# Patient Record
Sex: Female | Born: 1935 | Race: White | Hispanic: No | Smoking: Never smoker
Health system: Southern US, Community
[De-identification: ages and names within clinical notes are randomized; demographics above are authoritative.]

## PROBLEM LIST (undated history)

## (undated) DIAGNOSIS — I10 Essential (primary) hypertension: Secondary | ICD-10-CM

## (undated) DIAGNOSIS — M8580 Other specified disorders of bone density and structure, unspecified site: Secondary | ICD-10-CM

## (undated) DIAGNOSIS — K5909 Other constipation: Secondary | ICD-10-CM

## (undated) DIAGNOSIS — I6529 Occlusion and stenosis of unspecified carotid artery: Secondary | ICD-10-CM

## (undated) DIAGNOSIS — M1990 Unspecified osteoarthritis, unspecified site: Secondary | ICD-10-CM

## (undated) DIAGNOSIS — E785 Hyperlipidemia, unspecified: Secondary | ICD-10-CM

## (undated) HISTORY — DX: Hyperlipidemia, unspecified: E78.5

## (undated) HISTORY — DX: Other specified disorders of bone density and structure, unspecified site: M85.80

## (undated) HISTORY — DX: Unspecified osteoarthritis, unspecified site: M19.90

## (undated) HISTORY — PX: OTHER SURGICAL HISTORY: SHX169

## (undated) HISTORY — DX: Occlusion and stenosis of unspecified carotid artery: I65.29

## (undated) HISTORY — DX: Essential (primary) hypertension: I10

## (undated) HISTORY — DX: Other constipation: K59.09

---

## 1999-03-12 ENCOUNTER — Encounter: Payer: Self-pay | Admitting: Internal Medicine

## 1999-03-12 ENCOUNTER — Encounter: Admission: RE | Admit: 1999-03-12 | Discharge: 1999-03-12 | Payer: Self-pay | Admitting: Internal Medicine

## 1999-04-30 ENCOUNTER — Encounter: Payer: Self-pay | Admitting: Family Medicine

## 1999-04-30 LAB — CONVERTED CEMR LAB: Pap Smear: NORMAL

## 1999-05-04 ENCOUNTER — Encounter: Payer: Self-pay | Admitting: Urology

## 1999-05-04 ENCOUNTER — Encounter: Admission: RE | Admit: 1999-05-04 | Discharge: 1999-05-04 | Payer: Self-pay | Admitting: Urology

## 1999-05-15 ENCOUNTER — Other Ambulatory Visit: Admission: RE | Admit: 1999-05-15 | Discharge: 1999-05-15 | Payer: Self-pay | Admitting: Obstetrics & Gynecology

## 1999-05-19 ENCOUNTER — Encounter: Payer: Self-pay | Admitting: Internal Medicine

## 1999-05-19 ENCOUNTER — Encounter: Admission: RE | Admit: 1999-05-19 | Discharge: 1999-05-19 | Payer: Self-pay | Admitting: Internal Medicine

## 1999-07-28 ENCOUNTER — Encounter: Admission: RE | Admit: 1999-07-28 | Discharge: 1999-08-13 | Payer: Self-pay | Admitting: Family Medicine

## 2000-03-07 ENCOUNTER — Encounter: Admission: RE | Admit: 2000-03-07 | Discharge: 2000-03-07 | Payer: Self-pay | Admitting: Family Medicine

## 2000-03-07 ENCOUNTER — Encounter: Payer: Self-pay | Admitting: Family Medicine

## 2000-03-29 DIAGNOSIS — E785 Hyperlipidemia, unspecified: Secondary | ICD-10-CM

## 2000-03-29 HISTORY — DX: Hyperlipidemia, unspecified: E78.5

## 2000-10-27 HISTORY — PX: COLONOSCOPY: SHX174

## 2000-11-03 ENCOUNTER — Ambulatory Visit (HOSPITAL_COMMUNITY): Admission: RE | Admit: 2000-11-03 | Discharge: 2000-11-03 | Payer: Self-pay | Admitting: Gastroenterology

## 2002-08-28 HISTORY — PX: OTHER SURGICAL HISTORY: SHX169

## 2003-06-28 LAB — HM MAMMOGRAPHY: HM Mammogram: NORMAL

## 2004-03-31 ENCOUNTER — Ambulatory Visit: Payer: Self-pay | Admitting: Family Medicine

## 2004-07-24 ENCOUNTER — Ambulatory Visit: Payer: Self-pay | Admitting: Physician Assistant

## 2004-08-25 ENCOUNTER — Other Ambulatory Visit: Payer: Self-pay

## 2004-08-27 HISTORY — PX: TOTAL HIP ARTHROPLASTY: SHX124

## 2004-08-31 ENCOUNTER — Inpatient Hospital Stay: Payer: Self-pay | Admitting: Unknown Physician Specialty

## 2004-09-06 ENCOUNTER — Emergency Department: Payer: Self-pay | Admitting: Emergency Medicine

## 2004-09-07 ENCOUNTER — Encounter: Payer: Self-pay | Admitting: Internal Medicine

## 2004-10-21 ENCOUNTER — Ambulatory Visit: Payer: Self-pay | Admitting: Family Medicine

## 2004-12-27 HISTORY — PX: TOTAL HIP ARTHROPLASTY: SHX124

## 2004-12-29 ENCOUNTER — Ambulatory Visit: Payer: Self-pay | Admitting: Family Medicine

## 2005-01-07 ENCOUNTER — Inpatient Hospital Stay: Payer: Self-pay | Admitting: Unknown Physician Specialty

## 2005-01-13 ENCOUNTER — Encounter: Payer: Self-pay | Admitting: Internal Medicine

## 2005-01-27 ENCOUNTER — Encounter: Payer: Self-pay | Admitting: Internal Medicine

## 2005-03-17 ENCOUNTER — Ambulatory Visit: Payer: Self-pay | Admitting: Family Medicine

## 2005-03-31 ENCOUNTER — Ambulatory Visit: Payer: Self-pay | Admitting: Family Medicine

## 2005-04-30 ENCOUNTER — Ambulatory Visit: Payer: Self-pay | Admitting: Family Medicine

## 2005-05-14 ENCOUNTER — Ambulatory Visit: Payer: Self-pay | Admitting: Family Medicine

## 2005-06-15 ENCOUNTER — Ambulatory Visit: Payer: Self-pay | Admitting: Internal Medicine

## 2005-06-24 ENCOUNTER — Ambulatory Visit: Payer: Self-pay | Admitting: Family Medicine

## 2005-10-26 ENCOUNTER — Ambulatory Visit: Payer: Self-pay | Admitting: Family Medicine

## 2006-01-27 HISTORY — PX: OTHER SURGICAL HISTORY: SHX169

## 2006-02-09 ENCOUNTER — Other Ambulatory Visit: Payer: Self-pay

## 2006-02-09 ENCOUNTER — Emergency Department: Payer: Self-pay | Admitting: Emergency Medicine

## 2006-02-11 ENCOUNTER — Ambulatory Visit: Payer: Self-pay | Admitting: Family Medicine

## 2006-02-26 HISTORY — PX: OTHER SURGICAL HISTORY: SHX169

## 2006-03-03 ENCOUNTER — Ambulatory Visit: Payer: Self-pay | Admitting: *Deleted

## 2006-03-11 ENCOUNTER — Ambulatory Visit: Payer: Self-pay | Admitting: *Deleted

## 2006-04-05 ENCOUNTER — Ambulatory Visit: Payer: Self-pay | Admitting: Family Medicine

## 2006-06-09 ENCOUNTER — Encounter: Payer: Self-pay | Admitting: Family Medicine

## 2006-06-09 DIAGNOSIS — E785 Hyperlipidemia, unspecified: Secondary | ICD-10-CM | POA: Insufficient documentation

## 2006-06-09 DIAGNOSIS — R269 Unspecified abnormalities of gait and mobility: Secondary | ICD-10-CM | POA: Insufficient documentation

## 2006-06-09 DIAGNOSIS — M1990 Unspecified osteoarthritis, unspecified site: Secondary | ICD-10-CM | POA: Insufficient documentation

## 2006-06-09 DIAGNOSIS — K5909 Other constipation: Secondary | ICD-10-CM

## 2006-06-09 DIAGNOSIS — I10 Essential (primary) hypertension: Secondary | ICD-10-CM

## 2006-07-21 ENCOUNTER — Ambulatory Visit: Payer: Self-pay | Admitting: Family Medicine

## 2006-11-14 ENCOUNTER — Ambulatory Visit: Payer: Self-pay | Admitting: Family Medicine

## 2006-11-14 DIAGNOSIS — I6529 Occlusion and stenosis of unspecified carotid artery: Secondary | ICD-10-CM

## 2006-12-29 ENCOUNTER — Ambulatory Visit: Payer: Self-pay | Admitting: Family Medicine

## 2007-01-25 ENCOUNTER — Telehealth: Payer: Self-pay | Admitting: Family Medicine

## 2007-02-09 ENCOUNTER — Emergency Department: Payer: Medicare Other | Admitting: Emergency Medicine

## 2007-03-27 ENCOUNTER — Telehealth: Payer: Self-pay | Admitting: Family Medicine

## 2007-04-05 ENCOUNTER — Ambulatory Visit: Payer: Self-pay | Admitting: Family Medicine

## 2007-06-22 ENCOUNTER — Telehealth: Payer: Self-pay | Admitting: Family Medicine

## 2007-08-23 ENCOUNTER — Telehealth: Payer: Self-pay | Admitting: Family Medicine

## 2007-10-19 ENCOUNTER — Telehealth (INDEPENDENT_AMBULATORY_CARE_PROVIDER_SITE_OTHER): Payer: Self-pay | Admitting: *Deleted

## 2007-11-20 ENCOUNTER — Telehealth (INDEPENDENT_AMBULATORY_CARE_PROVIDER_SITE_OTHER): Payer: Self-pay | Admitting: *Deleted

## 2007-11-21 ENCOUNTER — Telehealth: Payer: Self-pay | Admitting: Family Medicine

## 2007-12-20 ENCOUNTER — Telehealth (INDEPENDENT_AMBULATORY_CARE_PROVIDER_SITE_OTHER): Payer: Self-pay | Admitting: *Deleted

## 2008-01-18 ENCOUNTER — Telehealth: Payer: Self-pay | Admitting: Family Medicine

## 2008-02-19 ENCOUNTER — Telehealth: Payer: Self-pay | Admitting: Family Medicine

## 2008-04-19 ENCOUNTER — Telehealth: Payer: Self-pay | Admitting: Family Medicine

## 2008-04-19 ENCOUNTER — Telehealth (INDEPENDENT_AMBULATORY_CARE_PROVIDER_SITE_OTHER): Payer: Self-pay | Admitting: *Deleted

## 2008-06-18 ENCOUNTER — Telehealth: Payer: Self-pay | Admitting: Family Medicine

## 2008-06-18 ENCOUNTER — Telehealth (INDEPENDENT_AMBULATORY_CARE_PROVIDER_SITE_OTHER): Payer: Self-pay | Admitting: *Deleted

## 2008-06-27 HISTORY — PX: OTHER SURGICAL HISTORY: SHX169

## 2008-07-09 ENCOUNTER — Ambulatory Visit: Payer: Self-pay | Admitting: Family Medicine

## 2008-07-09 DIAGNOSIS — M8580 Other specified disorders of bone density and structure, unspecified site: Secondary | ICD-10-CM

## 2008-07-10 LAB — CONVERTED CEMR LAB
ALT: 13 units/L (ref 0–35)
AST: 16 units/L (ref 0–37)
Albumin: 3.4 g/dL — ABNORMAL LOW (ref 3.5–5.2)
Alkaline Phosphatase: 62 units/L (ref 39–117)
BUN: 25 mg/dL — ABNORMAL HIGH (ref 6–23)
Basophils Absolute: 0 10*3/uL (ref 0.0–0.1)
Basophils Relative: 0.1 % (ref 0.0–3.0)
Bilirubin, Direct: 0.1 mg/dL (ref 0.0–0.3)
CO2: 30 meq/L (ref 19–32)
Calcium: 9.3 mg/dL (ref 8.4–10.5)
Chloride: 107 meq/L (ref 96–112)
Cholesterol: 132 mg/dL (ref 0–200)
Creatinine, Ser: 1 mg/dL (ref 0.4–1.2)
Eosinophils Absolute: 0.1 10*3/uL (ref 0.0–0.7)
Eosinophils Relative: 1.6 % (ref 0.0–5.0)
Glucose, Bld: 99 mg/dL (ref 70–99)
HCT: 34.1 % — ABNORMAL LOW (ref 36.0–46.0)
HDL: 38.1 mg/dL — ABNORMAL LOW (ref >39.00)
Hemoglobin: 11.5 g/dL — ABNORMAL LOW (ref 12.0–15.0)
LDL Cholesterol: 56 mg/dL (ref 0–99)
Lymphocytes Relative: 17.8 % (ref 12.0–46.0)
Lymphs Abs: 1.2 10*3/uL (ref 0.7–4.0)
MCHC: 33.8 g/dL (ref 30.0–36.0)
MCV: 97.7 fL (ref 78.0–100.0)
Monocytes Absolute: 0.8 10*3/uL (ref 0.1–1.0)
Monocytes Relative: 11.6 % (ref 3.0–12.0)
Neutro Abs: 4.9 10*3/uL (ref 1.4–7.7)
Neutrophils Relative %: 68.9 % (ref 43.0–77.0)
Phosphorus: 3.2 mg/dL (ref 2.3–4.6)
Platelets: 179 10*3/uL (ref 150.0–400.0)
Potassium: 4 meq/L (ref 3.5–5.1)
RBC: 3.49 M/uL — ABNORMAL LOW (ref 3.87–5.11)
RDW: 11.7 % (ref 11.5–14.6)
Sodium: 142 meq/L (ref 135–145)
TSH: 2.61 microintl units/mL (ref 0.35–5.50)
Total Bilirubin: 0.5 mg/dL (ref 0.3–1.2)
Total CHOL/HDL Ratio: 3
Total Protein: 6.7 g/dL (ref 6.0–8.3)
Triglycerides: 190 mg/dL — ABNORMAL HIGH (ref 0.0–149.0)
VLDL: 38 mg/dL (ref 0.0–40.0)
WBC: 7 10*3/uL (ref 4.5–10.5)

## 2008-07-11 ENCOUNTER — Encounter: Payer: Self-pay | Admitting: Family Medicine

## 2008-07-11 LAB — CONVERTED CEMR LAB: Vit D, 25-Hydroxy: 40 ng/mL (ref 30–89)

## 2008-07-15 ENCOUNTER — Encounter (INDEPENDENT_AMBULATORY_CARE_PROVIDER_SITE_OTHER): Payer: Self-pay | Admitting: *Deleted

## 2008-07-18 ENCOUNTER — Ambulatory Visit: Payer: Self-pay | Admitting: Family Medicine

## 2008-07-18 LAB — CONVERTED CEMR LAB
OCCULT 1: NEGATIVE
OCCULT 2: NEGATIVE
OCCULT 3: NEGATIVE

## 2008-07-19 ENCOUNTER — Telehealth (INDEPENDENT_AMBULATORY_CARE_PROVIDER_SITE_OTHER): Payer: Self-pay | Admitting: *Deleted

## 2008-08-19 ENCOUNTER — Telehealth: Payer: Self-pay | Admitting: Family Medicine

## 2008-08-19 ENCOUNTER — Ambulatory Visit: Payer: Self-pay | Admitting: Family Medicine

## 2008-08-19 DIAGNOSIS — D649 Anemia, unspecified: Secondary | ICD-10-CM | POA: Insufficient documentation

## 2008-08-21 ENCOUNTER — Encounter: Payer: Self-pay | Admitting: Family Medicine

## 2008-08-21 LAB — CONVERTED CEMR LAB
Basophils Absolute: 0 10*3/uL (ref 0.0–0.1)
Basophils Relative: 0 % (ref 0.0–3.0)
Eosinophils Absolute: 0.1 10*3/uL (ref 0.0–0.7)
Eosinophils Relative: 1.8 % (ref 0.0–5.0)
Folate: 17.7 ng/mL
HCT: 34.9 % — ABNORMAL LOW (ref 36.0–46.0)
Hemoglobin: 12.1 g/dL (ref 12.0–15.0)
Lymphocytes Relative: 26.8 % (ref 12.0–46.0)
Lymphs Abs: 1.8 10*3/uL (ref 0.7–4.0)
MCHC: 34.7 g/dL (ref 30.0–36.0)
MCV: 97.2 fL (ref 78.0–100.0)
Monocytes Absolute: 1 10*3/uL (ref 0.1–1.0)
Monocytes Relative: 14 % — ABNORMAL HIGH (ref 3.0–12.0)
Neutro Abs: 4 10*3/uL (ref 1.4–7.7)
Neutrophils Relative %: 57.4 % (ref 43.0–77.0)
Platelets: 214 10*3/uL (ref 150.0–400.0)
RBC: 3.59 M/uL — ABNORMAL LOW (ref 3.87–5.11)
RDW: 12 % (ref 11.5–14.6)
Vitamin B-12: 244 pg/mL (ref 211–911)
WBC: 6.9 10*3/uL (ref 4.5–10.5)

## 2008-08-22 ENCOUNTER — Ambulatory Visit: Payer: Self-pay | Admitting: Family Medicine

## 2008-08-22 DIAGNOSIS — E538 Deficiency of other specified B group vitamins: Secondary | ICD-10-CM | POA: Insufficient documentation

## 2008-09-05 ENCOUNTER — Ambulatory Visit: Payer: Self-pay | Admitting: Family Medicine

## 2008-09-20 ENCOUNTER — Telehealth: Payer: Self-pay | Admitting: Internal Medicine

## 2008-10-10 ENCOUNTER — Ambulatory Visit: Payer: Self-pay | Admitting: Family Medicine

## 2008-10-21 ENCOUNTER — Telehealth: Payer: Self-pay | Admitting: Family Medicine

## 2008-11-08 ENCOUNTER — Emergency Department: Payer: Medicare Other | Admitting: Emergency Medicine

## 2008-11-21 ENCOUNTER — Telehealth: Payer: Self-pay | Admitting: Family Medicine

## 2008-12-23 ENCOUNTER — Encounter: Payer: Self-pay | Admitting: Family Medicine

## 2008-12-23 ENCOUNTER — Telehealth (INDEPENDENT_AMBULATORY_CARE_PROVIDER_SITE_OTHER): Payer: Self-pay | Admitting: *Deleted

## 2009-02-17 ENCOUNTER — Telehealth: Payer: Self-pay | Admitting: Family Medicine

## 2009-03-05 ENCOUNTER — Ambulatory Visit: Payer: Self-pay | Admitting: Family Medicine

## 2009-03-07 LAB — CONVERTED CEMR LAB
ALT: 16 units/L (ref 0–35)
AST: 20 units/L (ref 0–37)
Albumin: 3.6 g/dL (ref 3.5–5.2)
BUN: 29 mg/dL — ABNORMAL HIGH (ref 6–23)
Basophils Absolute: 0 10*3/uL (ref 0.0–0.1)
Basophils Relative: 0.4 % (ref 0.0–3.0)
CO2: 28 meq/L (ref 19–32)
Calcium: 9.7 mg/dL (ref 8.4–10.5)
Chloride: 104 meq/L (ref 96–112)
Cholesterol: 141 mg/dL (ref 0–200)
Creatinine, Ser: 0.9 mg/dL (ref 0.4–1.2)
Direct LDL: 61.2 mg/dL
Eosinophils Absolute: 0.2 10*3/uL (ref 0.0–0.7)
Eosinophils Relative: 2.4 % (ref 0.0–5.0)
GFR calc non Af Amer: 62.99 mL/min (ref >60)
Glucose, Bld: 109 mg/dL — ABNORMAL HIGH (ref 70–99)
HCT: 34.7 % — ABNORMAL LOW (ref 36.0–46.0)
HDL: 45.5 mg/dL (ref >39.00)
Hemoglobin: 12 g/dL (ref 12.0–15.0)
Lymphocytes Relative: 23.6 % (ref 12.0–46.0)
Lymphs Abs: 1.9 10*3/uL (ref 0.7–4.0)
MCHC: 34.5 g/dL (ref 30.0–36.0)
MCV: 101.4 fL — ABNORMAL HIGH (ref 78.0–100.0)
Monocytes Absolute: 0.9 10*3/uL (ref 0.1–1.0)
Monocytes Relative: 10.9 % (ref 3.0–12.0)
Neutro Abs: 4.9 10*3/uL (ref 1.4–7.7)
Neutrophils Relative %: 62.7 % (ref 43.0–77.0)
Phosphorus: 3.9 mg/dL (ref 2.3–4.6)
Platelets: 201 10*3/uL (ref 150.0–400.0)
Potassium: 3.6 meq/L (ref 3.5–5.1)
RBC: 3.42 M/uL — ABNORMAL LOW (ref 3.87–5.11)
RDW: 12 % (ref 11.5–14.6)
Sodium: 139 meq/L (ref 135–145)
TSH: 2.17 microintl units/mL (ref 0.35–5.50)
Total CHOL/HDL Ratio: 3
Triglycerides: 268 mg/dL — ABNORMAL HIGH (ref 0.0–149.0)
VLDL: 53.6 mg/dL — ABNORMAL HIGH (ref 0.0–40.0)
Vitamin B-12: 733 pg/mL (ref 211–911)
WBC: 7.9 10*3/uL (ref 4.5–10.5)

## 2009-03-19 ENCOUNTER — Telehealth: Payer: Self-pay | Admitting: Family Medicine

## 2009-04-24 ENCOUNTER — Telehealth: Payer: Self-pay | Admitting: Family Medicine

## 2009-05-12 ENCOUNTER — Telehealth: Payer: Self-pay | Admitting: Family Medicine

## 2009-05-22 ENCOUNTER — Telehealth: Payer: Self-pay | Admitting: Family Medicine

## 2009-06-25 ENCOUNTER — Telehealth: Payer: Self-pay | Admitting: Family Medicine

## 2009-07-24 ENCOUNTER — Telehealth: Payer: Self-pay | Admitting: Family Medicine

## 2009-08-21 ENCOUNTER — Telehealth: Payer: Self-pay | Admitting: Family Medicine

## 2009-09-05 ENCOUNTER — Ambulatory Visit: Payer: Self-pay | Admitting: Family Medicine

## 2009-09-05 DIAGNOSIS — R5383 Other fatigue: Secondary | ICD-10-CM

## 2009-09-05 DIAGNOSIS — R5381 Other malaise: Secondary | ICD-10-CM

## 2009-09-08 ENCOUNTER — Encounter: Payer: Self-pay | Admitting: Family Medicine

## 2009-09-08 LAB — CONVERTED CEMR LAB
BUN: 27 mg/dL — ABNORMAL HIGH (ref 6–23)
Basophils Relative: 0.4 % (ref 0.0–3.0)
CO2: 28 meq/L (ref 19–32)
Chloride: 104 meq/L (ref 96–112)
Cholesterol: 160 mg/dL (ref 0–200)
Eosinophils Relative: 0.8 % (ref 0.0–5.0)
GFR calc non Af Amer: 55.07 mL/min (ref >60)
HCT: 37.3 % (ref 36.0–46.0)
Hemoglobin: 12.8 g/dL (ref 12.0–15.0)
Lymphs Abs: 1.4 10*3/uL (ref 0.7–4.0)
Monocytes Relative: 10.7 % (ref 3.0–12.0)
Neutro Abs: 5.5 10*3/uL (ref 1.4–7.7)
RBC: 3.78 M/uL — ABNORMAL LOW (ref 3.87–5.11)
RDW: 13 % (ref 11.5–14.6)
Total CHOL/HDL Ratio: 3
WBC: 7.8 10*3/uL (ref 4.5–10.5)

## 2009-09-22 ENCOUNTER — Telehealth: Payer: Self-pay | Admitting: Family Medicine

## 2009-10-23 ENCOUNTER — Telehealth: Payer: Self-pay | Admitting: Family Medicine

## 2009-11-19 ENCOUNTER — Ambulatory Visit: Payer: Self-pay | Admitting: Family Medicine

## 2009-11-27 ENCOUNTER — Telehealth: Payer: Self-pay | Admitting: Family Medicine

## 2009-12-16 ENCOUNTER — Encounter: Payer: Self-pay | Admitting: Family Medicine

## 2009-12-29 ENCOUNTER — Telehealth: Payer: Self-pay | Admitting: Family Medicine

## 2010-01-26 ENCOUNTER — Telehealth: Payer: Self-pay | Admitting: Family Medicine

## 2010-03-02 ENCOUNTER — Telehealth: Payer: Self-pay | Admitting: Family Medicine

## 2010-03-27 ENCOUNTER — Telehealth: Payer: Self-pay | Admitting: Family Medicine

## 2010-04-28 NOTE — Progress Notes (Signed)
Summary: ok to get zostavax?  Phone Note Call from Patient Call back at Home Phone [phone]   Caller: Patient Call For: Judith Part MD Summary of Call: Pt is to get a shingles vaccine at [facility] tomorrow but she wanted your opinion first.  Please advise on if she should get this. Initial call taken by: Lowella Petties CMA,  May 12, 2009 4:12 PM  Follow-up for Phone Call        as long as she is not on steroids of any kind - is ok  Follow-up by: Judith Part MD,  May 12, 2009 4:34 PM  Additional Follow-up for Phone Call Additional follow up Details #1::        Patient notified as instructed by telephone. also spoke with April at [facility] and she will let Rob know.Lewanda Rife LPN  May 12, 2009 5:02 PM

## 2010-04-28 NOTE — Progress Notes (Signed)
Summary: hydrocodone   Phone Note Refill Request Message from:  Fax from Pharmacy on January 26, 2010 4:19 PM  Refills Requested: Medication #1:  HYDROCODONE-ACETAMINOPHEN 2.5-500 MG TABS 1 by mouth up to every 6 hours as needed pain  use caution - of sedation   Last Refilled: 12/29/2009 Refill request from [place]. [phone].  Initial call taken by: Melody Comas,  January 26, 2010 4:20 PM  Follow-up for Phone Call        px written on EMR for call in  Follow-up by: Judith Part MD,  January 26, 2010 4:41 PM  Additional Follow-up for Phone Call Additional follow up Details #1::        Medication phoned to [facility]  pharmacy as instructed. Lewanda Rife LPN  January 26, 2010 4:52 PM     Prescriptions: HYDROCODONE-ACETAMINOPHEN 2.5-500 MG TABS (HYDROCODONE-ACETAMINOPHEN) 1 by mouth up to every 6 hours as needed pain  use caution - of sedation  #120 x 0   Entered and Authorized by:   Judith Part MD   Signed by:   Lewanda Rife LPN on 45/40/9811   Method used:   Telephoned to ...       [facility]* (retail)       [address]       [place], [place]  [address]       Ph: [phone]       Fax: [phone]   RxID:   [id number]

## 2010-04-28 NOTE — Miscellaneous (Signed)
Summary: Controlled Substance Agreement  Controlled Substance Agreement   Imported By: Lanelle Bal 11/25/2009 11:37:18  _____________________________________________________________________  External Attachment:    Type:   Image     Comment:   External Document

## 2010-04-28 NOTE — Progress Notes (Signed)
Summary: refill request for vicodin  Phone Note Refill Request Message from:  Fax from Pharmacy  Refills Requested: Medication #1:  HYDROCODONE-ACETAMINOPHEN 2.5-500 MG TABS 1 by mouth up to every 6 hours as needed pain  use caution - of sedation.   Last Refilled: 03/19/2009 Faxed request from [place], [phone].  Initial call taken by: Lowella Petties CMA,  April 24, 2009 4:40 PM  Follow-up for Phone Call        px written on EMR for call in  Follow-up by: Judith Part MD,  April 24, 2009 8:39 PM  Additional Follow-up for Phone Call Additional follow up Details #1::        Called to [facility]. Additional Follow-up by: Lowella Petties CMA,  April 25, 2009 8:57 AM    Prescriptions: HYDROCODONE-ACETAMINOPHEN 2.5-500 MG TABS (HYDROCODONE-ACETAMINOPHEN) 1 by mouth up to every 6 hours as needed pain  use caution - of sedation  #120 x 0   Entered and Authorized by:   Judith Part MD   Signed by:   Judith Part MD on 04/24/2009   Method used:   Telephoned to ...       [facility] (retail)       [address]       [place], [place]  [address]       Ph: [phone]       Fax: [phone]   RxID:   [id number]

## 2010-04-28 NOTE — Progress Notes (Signed)
Summary: refill request for vicodin  Phone Note Refill Request Message from:  Fax from Pharmacy  Refills Requested: Medication #1:  HYDROCODONE-ACETAMINOPHEN 2.5-500 MG TABS 1 by mouth up to every 6 hours as needed pain  use caution - of sedation.   Last Refilled: 05/23/2009 Faxed request from [place], [phone].  Initial call taken by: Lowella Petties CMA,  June 25, 2009 11:18 AM  Follow-up for Phone Call        px written on EMR for call in  Follow-up by: Judith Part MD,  June 25, 2009 11:21 AM  Additional Follow-up for Phone Call Additional follow up Details #1::        Medication phoned to [facility] pharmacy as instructed. Lewanda Rife LPN  June 25, 2009 11:39 AM     New/Updated Medications: HYDROCODONE-ACETAMINOPHEN 2.5-500 MG TABS (HYDROCODONE-ACETAMINOPHEN) 1 by mouth up to every 6 hours as needed pain  use caution - of sedation Prescriptions: HYDROCODONE-ACETAMINOPHEN 2.5-500 MG TABS (HYDROCODONE-ACETAMINOPHEN) 1 by mouth up to every 6 hours as needed pain  use caution - of sedation  #120 x 0   Entered and Authorized by:   Judith Part MD   Signed by:   Lewanda Rife LPN on 38/75/6433   Method used:   Telephoned to ...       [facility] (retail)       [address]       [place], [place]  [address]       Ph: [phone]       Fax: [phone]   RxID:   [id number]

## 2010-04-28 NOTE — Letter (Signed)
Summary: Results Follow-up Letter  [place] [facility]  [address] [place], [place] [address]   Phone: [phone]  Fax: [phone]    09/08/2009  [address] [place], [place]  [address]    Dear Ms. Bartel,     The following are the results of your recent test(s):  Test     Result     Pap Smear    Normal_______  Not Normal_____       Comments: _________________________________________________________ Cholesterol LDL(Bad cholesterol): 70.8         Your goal is less than:  100       HDL (Good cholesterol): 48.8       Your goal is more than:39 _________________________________________________________ Other Tests:Dr. Kye Silverstein said your labs were OK. The cholesterol is stable. Update Dr. Milinda Antis if any further weak spells. A copy of your lab results are enclosed also. Thank you.   _________________________________________________________  Please call for an appointment Or _________________________________________________________ _________________________________________________________ _________________________________________________________  Sincerely,    Lewanda Rife LPN  at [facility]

## 2010-04-28 NOTE — Progress Notes (Signed)
Summary: refill request for vicodin  Phone Note Refill Request Message from:  Fax from Pharmacy  Refills Requested: Medication #1:  HYDROCODONE-ACETAMINOPHEN 2.5-500 MG TABS 1 by mouth up to every 6 hours as needed pain  use caution - of sedation.   Last Refilled: 06/25/2009 Faxed request from [place], [phone].  Initial call taken by: Lowella Petties CMA,  July 24, 2009 10:59 AM  Follow-up for Phone Call        px written on EMR for call in  Follow-up by: Judith Part MD,  July 24, 2009 12:58 PM  Additional Follow-up for Phone Call Additional follow up Details #1::        Medication phoned to [facility]  pharmacy as instructed. Lewanda Rife LPN  July 24, 2009 1:01 PM     Prescriptions: HYDROCODONE-ACETAMINOPHEN 2.5-500 MG TABS (HYDROCODONE-ACETAMINOPHEN) 1 by mouth up to every 6 hours as needed pain  use caution - of sedation  #120 x 0   Entered and Authorized by:   Judith Part MD   Signed by:   Lewanda Rife LPN on 82/95/6213   Method used:   Telephoned to ...       [facility] (retail)       [address]       [place], [place]  [address]       Ph: [phone]       Fax: [phone]   RxID:   [id number]

## 2010-04-28 NOTE — Progress Notes (Signed)
Summary: refill request for vicodin  Phone Note Refill Request Message from:  Fax from Pharmacy  Refills Requested: Medication #1:  HYDROCODONE-ACETAMINOPHEN 2.5-500 MG TABS 1 by mouth up to every 6 hours as needed pain  use caution - of sedation   Last Refilled: 08/21/2009 Faxed request from [facility].  Initial call taken by: Lowella Petties CMA,  September 22, 2009 9:12 AM  Follow-up for Phone Call        px written on EMR for call in  Follow-up by: Judith Part MD,  September 22, 2009 10:08 AM  Additional Follow-up for Phone Call Additional follow up Details #1::        Medication phoned to pharmacy.  Additional Follow-up by: Delilah Shan CMA [facility]),  September 22, 2009 12:19 PM    Prescriptions: HYDROCODONE-ACETAMINOPHEN 2.5-500 MG TABS (HYDROCODONE-ACETAMINOPHEN) 1 by mouth up to every 6 hours as needed pain  use caution - of sedation  #120 x 0   Entered and Authorized by:   Judith Part MD   Signed by:   Delilah Shan CMA (AAMA) on 09/22/2009   Method used:   Telephoned to ...       [facility] (retail)       [address]       [place], [place]  [address]       Ph: [phone]       Fax: [phone]   RxID:   [id number]

## 2010-04-28 NOTE — Progress Notes (Signed)
Summary: refill request for vicodin  Phone Note Refill Request Message from:  Fax from Pharmacy  Refills Requested: Medication #1:  HYDROCODONE-ACETAMINOPHEN 2.5-500 MG TABS 1 by mouth up to every 6 hours as needed pain  use caution - of sedation.   Last Refilled: 07/24/2009 Faxed request from [place], [phone].  Initial call taken by: Lowella Petties CMA,  Aug 21, 2009 9:31 AM  Follow-up for Phone Call        px written on EMR for call in  Follow-up by: Judith Part MD,  Aug 21, 2009 11:35 AM  Additional Follow-up for Phone Call Additional follow up Details #1::        Medication phoned to pharmacy.  Additional Follow-up by: Delilah Shan CMA Duncan Dull),  Aug 21, 2009 12:14 PM    Prescriptions: HYDROCODONE-ACETAMINOPHEN 2.5-500 MG TABS (HYDROCODONE-ACETAMINOPHEN) 1 by mouth up to every 6 hours as needed pain  use caution - of sedation  #120 x 0   Entered and Authorized by:   Judith Part MD   Signed by:   Delilah Shan CMA (AAMA) on 08/21/2009   Method used:   Telephoned to ...       [facility] (retail)       [address]       [place], [place]  [address]       Ph: [phone]       Fax: [phone]   RxID:   [id number]

## 2010-04-28 NOTE — Miscellaneous (Signed)
Summary: Flu vaccine administered at [facility].   Clinical Lists Changes  Observations: Added new observation of FLU VAX: Historical (12/09/2009 16:57)      Immunization History:  Influenza Immunization History:    Influenza:  historical (12/09/2009)  Received fax from [facility] [address] [place], [place] [address] phone [phone]. Pt was given Fluvirin Multidose vial 2011-131ml Dose give 0.80ml in rt deltoid IM. Mfg [employer] and date administered 12/09/09 Lot # [id number].Lewanda Rife LPN  December 16, 2009 4:59 PM

## 2010-04-28 NOTE — Progress Notes (Signed)
Summary: hydrocodone  Phone Note Refill Request Message from:  Fax from Pharmacy on December 29, 2009 10:04 AM  Refills Requested: Medication #1:  HYDROCODONE-ACETAMINOPHEN 2.5-500 MG TABS 1 by mouth up to every 6 hours as needed pain  use caution - of sedation   Last Refilled: 11/27/2009 Refill request from [place]. [phone]  Initial call taken by: Melody Comas,  December 29, 2009 10:05 AM  Follow-up for Phone Call        px written on EMR for call in  Follow-up by: Judith Part MD,  December 29, 2009 10:28 AM  Additional Follow-up for Phone Call Additional follow up Details #1::        Medication phoned to [facility] pharmacy as instructed. Lewanda Rife LPN  December 29, 2009 11:14 AM     New/Updated Medications: HYDROCODONE-ACETAMINOPHEN 2.5-500 MG TABS (HYDROCODONE-ACETAMINOPHEN) 1 by mouth up to every 6 hours as needed pain  use caution - of sedation Prescriptions: HYDROCODONE-ACETAMINOPHEN 2.5-500 MG TABS (HYDROCODONE-ACETAMINOPHEN) 1 by mouth up to every 6 hours as needed pain  use caution - of sedation  #120 x 0   Entered and Authorized by:   Judith Part MD   Signed by:   Lewanda Rife LPN on 45/40/9811   Method used:   Telephoned to ...       [facility] (retail)       [address]       [place], [place]  [address]       Ph: [phone]       Fax: [phone]   RxID:   [id number]

## 2010-04-28 NOTE — Progress Notes (Signed)
Summary: Rx Hydrocodone/APAP  Phone Note Refill Request Call back at [phone] Message from:  [facility] on May 22, 2009 2:18 PM  Refills Requested: Medication #1:  HYDROCODONE-ACETAMINOPHEN 2.5-500 MG TABS 1 by mouth up to every 6 hours as needed pain  use caution - of sedation.   Last Refilled: 04/25/2009 Received faxed refill request, please advise.   Method Requested: Telephone to Pharmacy Initial call taken by: Linde Gillis CMA Duncan Dull),  May 22, 2009 2:18 PM  Follow-up for Phone Call        px written on EMR for call in  Follow-up by: Judith Part MD,  May 22, 2009 3:16 PM  Additional Follow-up for Phone Call Additional follow up Details #1::        Called to [facility]. Additional Follow-up by: Lowella Petties CMA,  May 23, 2009 8:53 AM    New/Updated Medications: HYDROCODONE-ACETAMINOPHEN 2.5-500 MG TABS (HYDROCODONE-ACETAMINOPHEN) 1 by mouth up to every 6 hours as needed pain  use caution - of sedation Prescriptions: HYDROCODONE-ACETAMINOPHEN 2.5-500 MG TABS (HYDROCODONE-ACETAMINOPHEN) 1 by mouth up to every 6 hours as needed pain  use caution - of sedation  #120 x 0   Entered and Authorized by:   Judith Part MD   Signed by:   Lowella Petties CMA on 05/23/2009   Method used:   Telephoned to ...       [facility] (retail)       [address]       [place], [place]  [address]       Ph: [phone]       Fax: [phone]   RxID:   [id number]

## 2010-04-28 NOTE — Progress Notes (Signed)
Summary: refill request for vicodin  Phone Note Refill Request Message from:  Fax from Pharmacy  Refills Requested: Medication #1:  HYDROCODONE-ACETAMINOPHEN 2.5-500 MG TABS 1 by mouth up to every 6 hours as needed pain  use caution - of sedation   Last Refilled: 10/23/2009 Faxed request from [place], [phone].  Initial call taken by: Lowella Petties CMA,  November 27, 2009 10:29 AM  Follow-up for Phone Call        px written on EMR for call in  Follow-up by: Judith Part MD,  November 27, 2009 12:35 PM  Additional Follow-up for Phone Call Additional follow up Details #1::        Medication phoned to [facility] pharmacy as instructed. Lewanda Rife LPN  November 27, 2009 2:41 PM     Prescriptions: HYDROCODONE-ACETAMINOPHEN 2.5-500 MG TABS (HYDROCODONE-ACETAMINOPHEN) 1 by mouth up to every 6 hours as needed pain  use caution - of sedation  #120 x 0   Entered and Authorized by:   Judith Part MD   Signed by:   Lewanda Rife LPN on 55/73/2202   Method used:   Telephoned to ...       [facility] (retail)       [address]       [place], [place]  [address]       Ph: [phone]       Fax: [phone]   RxID:   [id number]

## 2010-04-28 NOTE — Progress Notes (Signed)
Summary: hydrocodone   Phone Note Refill Request Message from:  Fax from Pharmacy on March 02, 2010 11:32 AM  Refills Requested: Medication #1:  HYDROCODONE-ACETAMINOPHEN 2.5-500 MG TABS 1 by mouth up to every 6 hours as needed pain  use caution - of sedation   Last Refilled: 03/02/2010 Refill request from [place]. [phone].   Initial call taken by: Melody Comas,  March 02, 2010 11:32 AM  Follow-up for Phone Call        ok to do.  please call in. Follow-up by: Eustaquio Boyden  MD,  March 02, 2010 12:11 PM  Additional Follow-up for Phone Call Additional follow up Details #1::        Rx called in as directed. Additional Follow-up by: Janee Morn CMA Duncan Dull),  March 02, 2010 12:35 PM    Prescriptions: HYDROCODONE-ACETAMINOPHEN 2.5-500 MG TABS (HYDROCODONE-ACETAMINOPHEN) 1 by mouth up to every 6 hours as needed pain  use caution - of sedation  #120 x 0   Entered and Authorized by:   Eustaquio Boyden  MD   Signed by:   Eustaquio Boyden  MD on 03/02/2010   Method used:   Telephoned to ...       [facility] (retail)       [address]       [place], [place]  [address]       Ph: [phone]       Fax: [phone]   RxID:   [id number]

## 2010-04-28 NOTE — Assessment & Plan Note (Signed)
Summary: ALMOST PASSED OUT   Vital Signs:  Patient profile:   77 year old female Height:      66 inches Weight:      134.75 pounds BMI:     21.83 Temp:     97.5 degrees F oral Pulse rate:   80 / minute Pulse rhythm:   regular BP sitting:   130 / 70  (left arm) BP standing:   120 / 59 Cuff size:   regular  Vitals Entered By: Lewanda Rife LPN (September 05, 2009 8:14 AM)  Serial Vital Signs/Assessments:  Time      Position  BP       Pulse  Resp  Temp     By           Lying RA  130/82                         Judith Part MD           Standing  120/59                         Judith Part MD  CC: Almost passed out on 09/02/09   History of Present Illness: had a weak spell tuesday -- did not loose consciousness blood pressure got to low  had come out of [facility] -- felt weak for a while  sister was with her  EMS came out to the site -- said bp was low and then said her bp was low -- ? how low  it lasted just few seconds to a minute she did get hot outdoors  then felt fine -- and went on with her regular day   no headache just got hot  no other symptoms  no focal weakness or numbness or speech problems    Allergies (verified): No Known Drug Allergies  Past History:  Past Medical History: Last updated: 07/09/2008 Hyperlipidemia 2002 Hypertension Osteoarthritis osteopenia- with arm fx carotid stenosis chronic constipation  Past Surgical History: Last updated: 12/28/2008 colonoscopy - diverticulosis 8/02 Dexa - osteopenia 6/04 arm fracture - 5/04 hip replacement 6/06 hip replacement 10/06 carotid doppler (50-79%RICA) 11/07 carotid doppler 50-69% RICA, less than 50% LICA- overall no change abdominal ultrasound - negative for AAA 12/07 dexa 4/10- osteopenia (worse at [facility] T -1.98)- imp at [facility] -1.0  Family History: Last updated: June 26, 2006 Father: Died 49 MI Mother: Died 42 Siblings: 1 sister dead MVP (BP) 3 sisters alive and well  Social History: Last  updated: 07/09/2008 Marital Status: widowed  Children: none Occupation: retired non smoker no alcohol  Risk Factors: Smoking Status: never (06/26/2006)  Review of Systems General:  Denies chills, fatigue, fever, loss of appetite, malaise, and weight loss. Eyes:  Denies blurring and eye irritation. ENT:  Denies sinus pressure. CV:  Denies chest pain or discomfort, palpitations, shortness of breath with exertion, and swelling of feet. Resp:  Denies cough, shortness of breath, and wheezing. GI:  Denies abdominal pain, nausea, and vomiting. GU:  Denies dysuria and hematuria. MS:  Complains of joint pain and stiffness; denies cramps and muscle weakness. Derm:  Denies itching, lesion(s), poor wound healing, and rash. Neuro:  Denies difficulty with concentration, disturbances in coordination, falling down, headaches, numbness, seizures, sensation of room spinning, tingling, tremors, and visual disturbances. Psych:  Denies anxiety and depression. Endo:  Denies cold intolerance, excessive thirst, excessive urination, and heat intolerance. Heme:  Denies abnormal  bruising and bleeding.  Physical Exam  General:  elderly somewhat frail appearing WF- uses cane to ambulate  Head:  normocephalic, atraumatic, and no abnormalities observed.  no sinus tenderness  Eyes:  vision grossly intact, pupils equal, pupils round, and pupils reactive to light.   Ears:  R ear normal and L ear normal.   pt is extremely hard of hearing  Nose:  no nasal discharge.   Mouth:  pharynx pink and moist.   Neck:  supple with full rom and no masses or thyromegally, no JVD or carotid bruit  Chest Wall:  No deformities, masses, or tenderness noted. Lungs:  Normal respiratory effort, chest expands symmetrically. Lungs are clear to auscultation, no crackles or wheezes. Heart:  Normal rate and regular rhythm. S1 and S2 normal without gallop, murmur, click, rub or other extra sounds. Abdomen:  Bowel sounds positive,abdomen  soft and non-tender without masses, organomegaly or hernias noted. no renal bruits  Msk:  No deformity or scoliosis noted of thoracic or lumbar spine.  some joint changes of OA  Pulses:  R and L carotid,radial,femoral,dorsalis pedis and posterior tibial pulses are full and equal bilaterally Extremities:  No clubbing, cyanosis, edema, or deformity noted with normal full range of motion of all joints.   Neurologic:  alert & oriented X3, cranial nerves II-XII intact, strength normal in all extremities, sensation intact to light touch, gait normal, DTRs symmetrical and normal, finger-to-nose normal, toes down bilaterally on Babinski, and Romberg negative.   Skin:  Intact without suspicious lesions or rashes Cervical Nodes:  No lymphadenopathy noted Psych:  normal affect, talkative and pleasant    Impression & Recommendations:  Problem # 1:  WEAKNESS (ICD-780.79) Assessment New weakness/ near syncope with reported hypotension when she got hot  tested orthostatics today disc imp of good fluid intake  cut benicar in 1/2  f/u 2-3 mo  lab today update asap or seek care if this happens again  Orders: Venipuncture (16109) TLB-Lipid Panel (80061-LIPID) TLB-Renal Function Panel (80069-RENAL) TLB-CBC Platelet - w/Differential (85025-CBCD) TLB-ALT (SGPT) (84460-ALT) TLB-AST (SGOT) (84450-SGOT)  Problem # 2:  UNSPECIFIED ANEMIA (ICD-285.9) Assessment: Unchanged  labs today in light of spell Her updated medication list for this problem includes:    Cyanocobalamin 1000 Mcg/ml Soln (Cyanocobalamin) .Marland Kitchen... Take one injection every 2 weeks  Orders: Venipuncture ([address]) TLB-Lipid Panel (80061-LIPID) TLB-Renal Function Panel (80069-RENAL) TLB-CBC Platelet - w/Differential (85025-CBCD) TLB-ALT (SGPT) (84460-ALT) TLB-AST (SGOT) (84450-SGOT)  Hgb: 12.0 (03/05/2009)   Hct: 34.7 (03/05/2009)   Platelets: 201.0 (03/05/2009) RBC: 3.42 (03/05/2009)   RDW: 12.0 (03/05/2009)   WBC: 7.9 (03/05/2009) MCV:  101.4 (03/05/2009)   MCHC: 34.5 (03/05/2009) B12: 733 (03/05/2009)   Folate: 17.7 (08/19/2008)   TSH: 2.17 (03/05/2009)  Problem # 3:  CAROTID ARTERY STENOSIS (ICD-433.10) Assessment: Comment Only this is follwed closely - no change / and continue asa Her updated medication list for this problem includes:    Bayer Low Strength 81 Mg Tbec (Aspirin) ..... One by mouth once daily  Problem # 4:  HYPERTENSION (ICD-401.9) Assessment: Unchanged  bp is good - lower on standing - so in light of poss hypotension will cut benicar in 1/2  explained this to pt in detail  Her updated medication list for this problem includes:    Benicar 40 Mg Tabs (Olmesartan medoxomil) .Marland Kitchen... 1/2  by mouth once daily    Hydrochlorothiazide 25 Mg Tabs (Hydrochlorothiazide) ..... One by mouth once daily  Orders: Venipuncture (09811) TLB-Lipid Panel (80061-LIPID) TLB-Renal Function Panel (80069-RENAL) TLB-CBC  Platelet - w/Differential (85025-CBCD) TLB-ALT (SGPT) (84460-ALT) TLB-AST (SGOT) (84450-SGOT)  BP today: 130/70 Prior BP: 140/50 (03/05/2009)  Labs Reviewed: K+: 3.6 (03/05/2009) Creat: : 0.9 (03/05/2009)   Chol: 141 (03/05/2009)   HDL: 45.50 (03/05/2009)   LDL: 56 (07/09/2008)   TG: 268.0 (03/05/2009)  Problem # 5:  HYPERLIPIDEMIA (ICD-272.4) Assessment: Unchanged  labs today - has been good on lovastatin  low sat fat diet - very good  Her updated medication list for this problem includes:    Lovastatin 20 Mg Tabs (Lovastatin) ..... One by mouth once daily  Orders: Venipuncture (16109) TLB-Lipid Panel (80061-LIPID) TLB-Renal Function Panel (80069-RENAL) TLB-CBC Platelet - w/Differential (85025-CBCD) TLB-ALT (SGPT) (84460-ALT) TLB-AST (SGOT) (84450-SGOT)  Labs Reviewed: SGOT: 20 (03/05/2009)   SGPT: 16 (03/05/2009)   HDL:45.50 (03/05/2009), 38.10 (07/09/2008)  LDL:56 (07/09/2008)  Chol:141 (03/05/2009), 132 (07/09/2008)  Trig:268.0 (03/05/2009), 190.0 (07/09/2008)  Complete Medication  List: 1)  Benicar 40 Mg Tabs (Olmesartan medoxomil) .... 1/2  by mouth once daily 2)  Hydrochlorothiazide 25 Mg Tabs (Hydrochlorothiazide) .... One by mouth once daily 3)  Lovastatin 20 Mg Tabs (Lovastatin) .... One by mouth once daily 4)  Bayer Low Strength 81 Mg Tbec (Aspirin) .... One by mouth once daily 5)  Vitamin E 400 Unit Caps (Vitamin e) .... One by mouth once daily 6)  Bl Vitamin C 1000 Mg Tabs (Ascorbic acid) .... One by mouth once daily 7)  Alendronate Sodium 70 Mg Tabs (Alendronate sodium) .... One tablet by mouth on the same day  every week 8)  Flexeril 10 Mg Tabs (Cyclobenzaprine hcl) .... 1/2 to 1 by mouth three times a day as needed leg/back muscle pain be cautious of sedation 9)  Benadryl 25 Mg Caps (Diphenhydramine hcl) .... Take 1 tablet by mouth once a day as needed 10)  Cyanocobalamin 1000 Mcg/ml Soln (Cyanocobalamin) .... Take one injection every 2 weeks 11)  B-100 Complex Tabs (Vitamins-lipotropics) .... Take one by mouth twice a day 12)  Hydrocodone-acetaminophen 2.5-500 Mg Tabs (Hydrocodone-acetaminophen) .Marland Kitchen.. 1 by mouth up to every 6 hours as needed pain  use caution - of sedation 13)  Vitamin B 12 ?mg  .... Take two tablets by mouth daily  Patient Instructions: 1)  cut your benicar in 1/2 -- just take a half pill per day 2)  this may be lowering blood pressure too much  3)  if any more weak spells - please let me know  4)  follow up with me in 2-3 months  5)  labs today   Current Allergies (reviewed today): No known allergies

## 2010-04-28 NOTE — Progress Notes (Signed)
Summary: refill request for vicodin  Phone Note Refill Request Message from:  Fax from Pharmacy  Refills Requested: Medication #1:  HYDROCODONE-ACETAMINOPHEN 2.5-500 MG TABS 1 by mouth up to every 6 hours as needed pain  use caution - of sedation   Last Refilled: 09/22/2009 Faxed request from [facility], [phone].  Initial call taken by: Lowella Petties CMA,  October 23, 2009 9:12 AM  Follow-up for Phone Call        px written on EMR for call in  Follow-up by: Judith Part MD,  October 23, 2009 9:35 AM  Additional Follow-up for Phone Call Additional follow up Details #1::        Medication phoned to [facility]  pharmacy as instructed. Lewanda Rife LPN  October 23, 2009 9:52 AM     Prescriptions: HYDROCODONE-ACETAMINOPHEN 2.5-500 MG TABS (HYDROCODONE-ACETAMINOPHEN) 1 by mouth up to every 6 hours as needed pain  use caution - of sedation  #120 x 0   Entered and Authorized by:   Judith Part MD   Signed by:   Lewanda Rife LPN on 11/91/4782   Method used:   Telephoned to ...       [facility]* (retail)       [address]       [place], [place]  [address]       Ph: [phone]       Fax: [phone]   RxID:   [id number]

## 2010-04-28 NOTE — Assessment & Plan Note (Signed)
Vital Signs:  Patient profile:   77 year old female Height:      66 inches Weight:      138.25 pounds BMI:     22.39 Temp:     98.3 degrees F oral Pulse rate:   84 / minute Pulse rhythm:   regular BP sitting:   138 / 68  (left arm) Cuff size:   regular  Vitals Entered By: Lewanda Rife LPN (November 19, 2009 12:14 PM) CC: f/u BP   History of Present Illness: last visit cut benicar in 1/2 for pre syncopal episodes that were positional/ orthostatic   bp is 138/68 today- not bad  wt is up 4 lb  is feeling pretty good- no problems  no more weak episodes at all  does not mind cuttig pills in 1/2   prune juice for chronic constip - only works sometimes ? other recommendations   needs walker with seat sometimes needs to sit urgently due to severe leg pain from OA this would inc her independence- especially shopping and out in public       Allergies (verified): No Known Drug Allergies  Past History:  Past Medical History: Last updated: 07/09/2008 Hyperlipidemia 2002 Hypertension Osteoarthritis osteopenia- with arm fx carotid stenosis chronic constipation  Past Surgical History: Last updated: 12/28/2008 colonoscopy - diverticulosis 8/02 Dexa - osteopenia 6/04 arm fracture - 5/04 hip replacement 6/06 hip replacement 10/06 carotid doppler (50-79%RICA) 11/07 carotid doppler 50-69% RICA, less than 50% LICA- overall no change abdominal ultrasound - negative for AAA 12/07 dexa 4/10- osteopenia (worse at [facility] T -1.98)- imp at LS -1.0  Family History: Last updated: 07-02-2006 Father: Died 77 MI Mother: Died 50 Siblings: 1 sister dead MVP (BP) 3 sisters alive and well  Social History: Last updated: 07/09/2008 Marital Status: widowed  Children: none Occupation: retired non smoker no alcohol  Risk Factors: Smoking Status: never (07/02/2006)  Review of Systems General:  Denies fatigue, loss of appetite, and malaise. Eyes:  Denies blurring and eye  irritation. CV:  Denies chest pain or discomfort, lightheadness, and palpitations. Resp:  Denies cough, shortness of breath, and wheezing. GI:  Denies abdominal pain, change in bowel habits, indigestion, and nausea. GU:  Denies urinary frequency. MS:  Denies muscle aches and muscle weakness. Derm:  Denies itching and rash. Neuro:  Denies difficulty with concentration, disturbances in coordination, falling down, headaches, poor balance, seizures, sensation of room spinning, tingling, tremors, visual disturbances, and weakness. Psych:  Denies anxiety and depression. Endo:  Denies cold intolerance, excessive thirst, excessive urination, and heat intolerance. Heme:  Denies abnormal bruising and bleeding.  Physical Exam  General:  elderly somewhat frail appearing WF- uses walker  to ambulate  Head:  normocephalic, atraumatic, and no abnormalities observed.   Eyes:  vision grossly intact, pupils equal, pupils round, and pupils reactive to light.  no conjunctival pallor, injection or icterus  Mouth:  pharynx pink and moist.   Neck:  supple with full rom and no masses or thyromegally, no JVD or carotid bruit  Chest Wall:  No deformities, masses, or tenderness noted. Lungs:  Normal respiratory effort, chest expands symmetrically. Lungs are clear to auscultation, no crackles or wheezes. Heart:  Normal rate and regular rhythm. S1 and S2 normal without gallop, murmur, click, rub or other extra sounds. Abdomen:  Bowel sounds positive,abdomen soft and non-tender without masses, organomegaly or hernias noted. Msk:  No deformity or scoliosis noted of thoracic or lumbar spine.  some joint changes of OA  Pulses:  R and L carotid,radial,femoral,dorsalis pedis and posterior tibial pulses are full and equal bilaterally Extremities:  No clubbing, cyanosis, edema, or deformity noted   Neurologic:  sensation intact to light touch and DTRs symmetrical and normal.  gait is slow and labored with walker Skin:  Intact  without suspicious lesions or rashes Cervical Nodes:  No lymphadenopathy noted Psych:  normal affect, talkative and pleasant  very hard of hearing    Impression & Recommendations:  Problem # 1:  HYPERTENSION (ICD-401.9) Assessment Unchanged  this is still well controlled on 1/2 benicar- will continue that disc health habits f/u 6 mo visit and labs  Her updated medication list for this problem includes:    Benicar 40 Mg Tabs (Olmesartan medoxomil) .Marland Kitchen... 1/2  by mouth once daily    Hydrochlorothiazide 25 Mg Tabs (Hydrochlorothiazide) ..... One by mouth once daily  BP today: 138/68 Prior BP: 120/59 (09/05/2009)  Labs Reviewed: K+: 4.3 (09/05/2009) Creat: : 1.0 (09/05/2009)   Chol: 160 (09/05/2009)   HDL: 48.80 (09/05/2009)   LDL: 56 (07/09/2008)   TG: 240.0 (09/05/2009)  Orders: Prescription Created Electronically [id number])  Problem # 2:  WEAKNESS (ICD-780.79) Assessment: Improved episodes are resolved on less bp med  doing well  Problem # 3:  OSTEOARTHRITIS (ICD-715.90) Assessment: Deteriorated for this with chronic leg pain -- px written for walker with seat  pt needs to stop and rest sometimes unexpectedly due to pain Her updated medication list for this problem includes:    Bayer Low Strength 81 Mg Tbec (Aspirin) ..... One by mouth once daily    Hydrocodone-acetaminophen 2.5-500 Mg Tabs (Hydrocodone-acetaminophen) .Marland Kitchen... 1 by mouth up to every 6 hours as needed pain  use caution - of sedation  Problem # 4:  CONSTIPATION, CHRONIC (ICD-564.09) Assessment: Unchanged will try miralax and continue prune juice and good fluid intake  Problem # 5:  GAIT DISTURBANCE (ICD-781.2) Assessment: Deteriorated caused primarily from OA written px for walker with seat today  Complete Medication List: 1)  Benicar 40 Mg Tabs (Olmesartan medoxomil) .... 1/2  by mouth once daily 2)  Hydrochlorothiazide 25 Mg Tabs (Hydrochlorothiazide) .... One by mouth once daily 3)  Lovastatin 20 Mg Tabs  (Lovastatin) .... One by mouth once daily 4)  Bayer Low Strength 81 Mg Tbec (Aspirin) .... One by mouth once daily 5)  Vitamin E 400 Unit Caps (Vitamin e) .... One by mouth once daily 6)  Bl Vitamin C 1000 Mg Tabs (Ascorbic acid) .... One by mouth once daily 7)  Alendronate Sodium 70 Mg Tabs (Alendronate sodium) .... One tablet by mouth on the same day  every week 8)  Flexeril 10 Mg Tabs (Cyclobenzaprine hcl) .... 1/2 to 1 by mouth three times a day as needed leg/back muscle pain be cautious of sedation 9)  Benadryl 25 Mg Caps (Diphenhydramine hcl) .... Take 1 tablet by mouth once a day as needed 10)  Cyanocobalamin 1000 Mcg/ml Soln (Cyanocobalamin) .... Take one injection every 2 weeks 11)  B-100 Complex Tabs (Vitamins-lipotropics) .... Take one by mouth twice a day 12)  Hydrocodone-acetaminophen 2.5-500 Mg Tabs (Hydrocodone-acetaminophen) .Marland Kitchen.. 1 by mouth up to every 6 hours as needed pain  use caution - of sedation 13)  Vitamin B 12 ?mg  .... Take two tablets by mouth daily 14)  Walker With Seat  .... To use once daily and as needed for ambulation 715.90, 781.2, 729.5  Patient Instructions: 1)  try miralax over the counter for constipation  2)  continue the  1/2 of benicar- blood pressure is ok  3)  here is px for walker with seat 4)  no change in other medicines 5)  follow up in 6 months  Prescriptions: BENICAR 40 MG  TABS (OLMESARTAN MEDOXOMIL) 1/2  by mouth once daily  #15 x 11   Entered and Authorized by:   Judith Part MD   Signed by:   Judith Part MD on 11/19/2009   Method used:   Electronically to        [facility]* (retail)       [address]       [place], [place]  [address]       Ph: [phone]       Fax: [phone]   RxID:   [id number] WALKER WITH SEAT to use once daily and as needed for ambulation 715.90, 781.2, 729.5  #1 x 0   Entered and Authorized by:   Judith Part MD   Signed by:   Judith Part MD on 11/19/2009   Method used:   Print then  Give to Patient   RxID:   [id number]   Current Allergies (reviewed today): No known allergies

## 2010-04-29 ENCOUNTER — Telehealth: Payer: Self-pay | Admitting: Family Medicine

## 2010-04-30 NOTE — Progress Notes (Signed)
Summary: Hydrocodone?apap 2.5/500mg   Phone Note Refill Request Call back at [phone] Message from:  [facility] on March 27, 2010 3:44 PM  Refills Requested: Medication #1:  HYDROCODONE-ACETAMINOPHEN 2.5-500 MG TABS 1 by mouth up to every 6 hours as needed pain  use caution - of sedation   Last Refilled: 03/02/2010 [facility] called to request refill on Hydrocodone-APAP 2.5/500. Last refill date 03/02/10.Please advise.    Method Requested: Telephone to Pharmacy Initial call taken by: Lewanda Rife LPN,  March 27, 2010 3:45 PM  Follow-up for Phone Call        px written on EMR for call in  Follow-up by: Judith Part MD,  March 27, 2010 3:51 PM  Additional Follow-up for Phone Call Additional follow up Details #1::        Medication phoned to [facility] pharmacy as instructed. Lewanda Rife LPN  March 27, 2010 4:40 PM     Prescriptions: HYDROCODONE-ACETAMINOPHEN 2.5-500 MG TABS (HYDROCODONE-ACETAMINOPHEN) 1 by mouth up to every 6 hours as needed pain  use caution - of sedation  #120 x 0   Entered and Authorized by:   Judith Part MD   Signed by:   Lewanda Rife LPN on 96/29/5284   Method used:   Telephoned to ...       [facility]* (retail)       [address]       [place], [place]  [address]       Ph: [phone]       Fax: [phone]   RxID:   [id number]

## 2010-05-06 NOTE — Progress Notes (Signed)
Summary: refill request for vicodin  Phone Note Refill Request Message from:  Fax from Pharmacy  Refills Requested: Medication #1:  HYDROCODONE-ACETAMINOPHEN 2.5-500 MG TABS 1 by mouth up to every 6 hours as needed pain  use caution - of sedation   Last Refilled: 03/27/2010 Faxed request from [facility], [phone].  Initial call taken by: Lowella Petties CMA, AAMA,  April 29, 2010 5:00 PM  Follow-up for Phone Call        px written on EMR for call in  Follow-up by: Judith Part MD,  April 29, 2010 5:08 PM  Additional Follow-up for Phone Call Additional follow up Details #1::        Medication phoned to [facility] pharmacy as instructed. Lewanda Rife LPN  April 30, 2010 8:36 AM     Prescriptions: HYDROCODONE-ACETAMINOPHEN 2.5-500 MG TABS (HYDROCODONE-ACETAMINOPHEN) 1 by mouth up to every 6 hours as needed pain  use caution - of sedation  #120 x 0   Entered and Authorized by:   Judith Part MD   Signed by:   Lewanda Rife LPN on 86/57/8469   Method used:   Telephoned to ...       [facility]* (retail)       [address]       [place], [place]  [address]       Ph: [phone]       Fax: [phone]   RxID:   [id number]

## 2010-05-27 ENCOUNTER — Telehealth: Payer: Self-pay | Admitting: Family Medicine

## 2010-05-28 ENCOUNTER — Telehealth: Payer: Self-pay | Admitting: Family Medicine

## 2010-06-01 ENCOUNTER — Encounter: Payer: Self-pay | Admitting: Family Medicine

## 2010-06-04 NOTE — Progress Notes (Signed)
Summary: refill request for vicodin  Phone Note Refill Request Message from:  Fax from Pharmacy  Refills Requested: Medication #1:  HYDROCODONE-ACETAMINOPHEN 2.5-500 MG TABS 1 by mouth up to every 6 hours as needed pain  use caution - of sedation   Last Refilled: 04/30/2010 Faxed request from [facility], [phone].  Initial call taken by: Lowella Petties CMA, AAMA,  May 27, 2010 3:33 PM  Follow-up for Phone Call        px written on EMR for call in  Follow-up by: Judith Part MD,  May 27, 2010 4:18 PM  Additional Follow-up for Phone Call Additional follow up Details #1::        Medication phoned to [facility] pharmacy as instructed. Lewanda Rife LPN  May 27, 2010 4:45 PM     Prescriptions: HYDROCODONE-ACETAMINOPHEN 2.5-500 MG TABS (HYDROCODONE-ACETAMINOPHEN) 1 by mouth up to every 6 hours as needed pain  use caution - of sedation  #120 x 0   Entered and Authorized by:   Judith Part MD   Signed by:   Judith Part MD on 05/27/2010   Method used:   Telephoned to ...       [facility]* (retail)       [address]       [place], [place]  [address]       Ph: [phone]       Fax: [phone]   RxID:   [id number]

## 2010-06-09 NOTE — Progress Notes (Signed)
Summary: form for handicapped placard  Phone Note Call from Patient   Caller: Patient Summary of Call: Pt has dropped off form for handicapped placard, form is on your shelf.                 Lowella Petties CMA, AAMA  May 28, 2010 2:30 PM   Follow-up for Phone Call        form done and in nurse in box  Follow-up by: Judith Part MD,  June 01, 2010 8:11 AM  Additional Follow-up for Phone Call Additional follow up Details #1::        Advised pt form is ready to pick up.           Lowella Petties CMA, AAMA  June 01, 2010 9:37 AM

## 2010-06-09 NOTE — Letter (Signed)
Summary: Application for Handicapped Placard  Application for Handicapped Placard   Imported By: Maryln Gottron 06/03/2010 14:36:25  _____________________________________________________________________  External Attachment:    Type:   Image     Comment:   External Document

## 2010-06-24 ENCOUNTER — Telehealth: Payer: Self-pay | Admitting: *Deleted

## 2010-06-24 MED ORDER — HYDROCODONE-ACETAMINOPHEN 2.5-500 MG PO TABS: ORAL_TABLET | ORAL | Status: DC

## 2010-06-24 NOTE — Telephone Encounter (Signed)
Medication phoned to [facility] pharmacy as instructed.  

## 2010-06-24 NOTE — Telephone Encounter (Signed)
Px written for call in   

## 2010-07-28 ENCOUNTER — Other Ambulatory Visit: Payer: Self-pay | Admitting: *Deleted

## 2010-07-28 MED ORDER — HYDROCHLOROTHIAZIDE 25 MG PO TABS: 25.0000 mg | ORAL_TABLET | Freq: Every day | ORAL | Status: DC

## 2010-07-28 MED ORDER — LOVASTATIN 20 MG PO TABS: 20.0000 mg | ORAL_TABLET | Freq: Every day | ORAL | Status: DC

## 2010-08-06 ENCOUNTER — Encounter: Payer: Self-pay | Admitting: Family Medicine

## 2010-08-07 ENCOUNTER — Encounter: Payer: Self-pay | Admitting: Family Medicine

## 2010-08-07 ENCOUNTER — Ambulatory Visit (INDEPENDENT_AMBULATORY_CARE_PROVIDER_SITE_OTHER): Payer: Medicare Other | Admitting: Family Medicine

## 2010-08-07 DIAGNOSIS — R21 Rash and other nonspecific skin eruption: Secondary | ICD-10-CM

## 2010-08-07 DIAGNOSIS — E538 Deficiency of other specified B group vitamins: Secondary | ICD-10-CM

## 2010-08-07 DIAGNOSIS — I10 Essential (primary) hypertension: Secondary | ICD-10-CM

## 2010-08-07 DIAGNOSIS — M949 Disorder of cartilage, unspecified: Secondary | ICD-10-CM

## 2010-08-07 DIAGNOSIS — E785 Hyperlipidemia, unspecified: Secondary | ICD-10-CM

## 2010-08-07 DIAGNOSIS — D649 Anemia, unspecified: Secondary | ICD-10-CM

## 2010-08-07 LAB — CBC WITH DIFFERENTIAL/PLATELET
Eosinophils Relative: 1.5 % (ref 0.0–5.0)
HCT: 38 % (ref 36.0–46.0)
Lymphs Abs: 1.4 10*3/uL (ref 0.7–4.0)
Monocytes Relative: 10 % (ref 3.0–12.0)
Platelets: 220 10*3/uL (ref 150.0–400.0)
WBC: 6.6 10*3/uL (ref 4.5–10.5)

## 2010-08-07 LAB — COMPREHENSIVE METABOLIC PANEL
Albumin: 3.8 g/dL (ref 3.5–5.2)
CO2: 24 mEq/L (ref 19–32)
Calcium: 9.3 mg/dL (ref 8.4–10.5)
Chloride: 105 mEq/L (ref 96–112)
GFR: 60.45 mL/min (ref >60.00)
Glucose, Bld: 106 mg/dL — ABNORMAL HIGH (ref 70–99)
Potassium: 4.2 mEq/L (ref 3.5–5.1)
Sodium: 139 mEq/L (ref 135–145)
Total Bilirubin: 0.4 mg/dL (ref 0.3–1.2)
Total Protein: 6.7 g/dL (ref 6.0–8.3)

## 2010-08-07 LAB — TSH: TSH: 2.25 u[IU]/mL (ref 0.35–5.50)

## 2010-08-07 LAB — VITAMIN B12: Vitamin B-12: >1500 pg/mL — ABNORMAL HIGH (ref 211–911)

## 2010-08-07 MED ORDER — HYDROCODONE-ACETAMINOPHEN 2.5-500 MG PO TABS: ORAL_TABLET | ORAL | Status: DC

## 2010-08-07 MED ORDER — MOMETASONE FUROATE 0.1 % EX CREA: TOPICAL_CREAM | CUTANEOUS | Status: DC

## 2010-08-07 NOTE — Assessment & Plan Note (Addendum)
Labs today , no symptoms Takes B100 complex tabs daily

## 2010-08-07 NOTE — Assessment & Plan Note (Signed)
Good control with hctz No changes  Lab today

## 2010-08-07 NOTE — Assessment & Plan Note (Addendum)
Labs today Controlled with mevacor  and diet  No problems

## 2010-08-07 NOTE — Assessment & Plan Note (Signed)
On ca and D and fosamax currently Checking D level today

## 2010-08-07 NOTE — Patient Instructions (Addendum)
Labs today Blood pressure looks good Here is px for your pain medicine  Use cream on your rash and update me if not improved in 1-2 weeks (I sent that px to [facility] pharmacy) Switch to dove soap for sensitive skin

## 2010-08-07 NOTE — Assessment & Plan Note (Signed)
Rash on arms appears to be allergic/dermatitis ? Cause Disc products Will change soap to dove for sens skin Also given elocon for short term use Will update if not imp  Did remind to protect skin from the sun

## 2010-08-07 NOTE — Assessment & Plan Note (Signed)
Labs today No supplement currently

## 2010-08-07 NOTE — Progress Notes (Signed)
  Subjective:    Patient ID: [name], female    DOB: 03-07-1921, 77 y.o.   MRN: [id number]  HPI A rash on both arms started last week  Just noticed last week  Some redness - used some medicated powder  Is very itchy  No perfume , no new products  ? What kind of soap- nothing new  ? If had this in the past   Not out in the sun at all   Is due for her las for HTN and B12 and anemia  Feels ok No change in her medicines- is compliant No falls or broken bones    bp is in good control  No cp or sob or edema   Review of Systems Review of Systems  Constitutional: Negative for fever, appetite change, fatigue and unexpected weight change.  Eyes: Negative for pain and visual disturbance.  Respiratory: Negative for cough and shortness of breath.   Cardiovascular: Negative.   Gastrointestinal: Negative for nausea, diarrhea and constipation.  Genitourinary: Negative for urgency and frequency.  Skin: Negative for pallor. pos for rash and itching, neg for insect bite or sun exposure Neurological: Negative for weakness, light-headedness, numbness and headaches.  Hematological: Negative for adenopathy. Does not bruise/bleed easily.  Psychiatric/Behavioral: Negative for dysphoric mood. The patient is not nervous/anxious.          Objective:   Physical Exam  Constitutional: She appears well-developed and well-nourished.       Elderly and hard of hearing and pleasant  HENT:  Head: Normocephalic and atraumatic.  Right Ear: Decreased hearing is noted.  Left Ear: Decreased hearing is noted.  Eyes: Conjunctivae and EOM are normal. Pupils are equal, round, and reactive to light.  Neck: Normal range of motion. Neck supple. No JVD present. No thyromegaly present.  Cardiovascular: Normal rate, regular rhythm and normal heart sounds.   Pulmonary/Chest: Effort normal and breath sounds normal. No respiratory distress. She has no wheezes.  Abdominal: Soft. Bowel sounds are normal.    Musculoskeletal: She exhibits no edema and no tenderness.  Lymphadenopathy:    She has no cervical adenopathy.  Neurological: She is alert.  Skin: Skin is warm and dry. No pallor.       Rash on arms has some scale- predominantly macular  Also many lentigos and delicate skin No open areas, however or vesicles   Psychiatric: She has a normal mood and affect.          Assessment & Plan:

## 2010-08-08 LAB — VITAMIN D 25 HYDROXY (VIT D DEFICIENCY, FRACTURES): Vit D, 25-Hydroxy: 44 ng/mL (ref 30–89)

## 2010-09-02 ENCOUNTER — Other Ambulatory Visit: Payer: Self-pay | Admitting: *Deleted

## 2010-09-02 MED ORDER — HYDROCODONE-ACETAMINOPHEN 2.5-500 MG PO TABS: ORAL_TABLET | ORAL | Status: DC

## 2010-09-02 NOTE — Telephone Encounter (Signed)
Medication phoned to [facility] pharmacy as instructed.  

## 2010-09-02 NOTE — Telephone Encounter (Signed)
Px written for call in   

## 2010-10-01 ENCOUNTER — Other Ambulatory Visit: Payer: Self-pay | Admitting: *Deleted

## 2010-10-01 MED ORDER — HYDROCODONE-ACETAMINOPHEN 2.5-500 MG PO TABS: ORAL_TABLET | ORAL | Status: DC

## 2010-10-01 NOTE — Telephone Encounter (Signed)
Px written for call in   

## 2010-10-02 NOTE — Telephone Encounter (Signed)
Vicodin called to [facility].

## 2010-10-28 ENCOUNTER — Other Ambulatory Visit: Payer: Self-pay | Admitting: *Deleted

## 2010-10-28 MED ORDER — HYDROCODONE-ACETAMINOPHEN 2.5-500 MG PO TABS: ORAL_TABLET | ORAL | Status: DC

## 2010-10-28 NOTE — Telephone Encounter (Signed)
Medication phoned to [facility] pharmacy as instructed.  

## 2010-10-28 NOTE — Telephone Encounter (Signed)
Px written for call in   

## 2010-11-08 ENCOUNTER — Inpatient Hospital Stay (HOSPITAL_COMMUNITY): Payer: Medicare Other

## 2010-11-08 ENCOUNTER — Inpatient Hospital Stay (HOSPITAL_COMMUNITY)
Admission: EM | Admit: 2010-11-08 | Discharge: 2010-11-10 | DRG: 312 | Disposition: A | Discharge status: Home / Self Care | Payer: Medicare Other | Attending: Internal Medicine | Admitting: Internal Medicine

## 2010-11-08 ENCOUNTER — Emergency Department (HOSPITAL_COMMUNITY): Payer: Medicare Other

## 2010-11-08 DIAGNOSIS — I951 Orthostatic hypotension: Principal | ICD-10-CM | POA: Diagnosis present

## 2010-11-08 DIAGNOSIS — I6529 Occlusion and stenosis of unspecified carotid artery: Secondary | ICD-10-CM | POA: Diagnosis present

## 2010-11-08 DIAGNOSIS — M1990 Unspecified osteoarthritis, unspecified site: Secondary | ICD-10-CM | POA: Diagnosis present

## 2010-11-08 DIAGNOSIS — Z7982 Long term (current) use of aspirin: Secondary | ICD-10-CM

## 2010-11-08 DIAGNOSIS — Z96649 Presence of unspecified artificial hip joint: Secondary | ICD-10-CM

## 2010-11-08 DIAGNOSIS — M810 Age-related osteoporosis without current pathological fracture: Secondary | ICD-10-CM | POA: Diagnosis present

## 2010-11-08 DIAGNOSIS — I10 Essential (primary) hypertension: Secondary | ICD-10-CM | POA: Diagnosis present

## 2010-11-08 DIAGNOSIS — E785 Hyperlipidemia, unspecified: Secondary | ICD-10-CM | POA: Diagnosis present

## 2010-11-08 LAB — URINALYSIS, ROUTINE W REFLEX MICROSCOPIC
Bilirubin Urine: NEGATIVE
Hgb urine dipstick: NEGATIVE
Ketones, ur: NEGATIVE mg/dL
Nitrite: NEGATIVE
Urobilinogen, UA: 0.2 mg/dL (ref 0.0–1.0)

## 2010-11-08 LAB — BASIC METABOLIC PANEL
BUN: 32 mg/dL — ABNORMAL HIGH (ref 6–23)
Creatinine, Ser: 1.01 mg/dL (ref 0.50–1.10)
GFR calc Af Amer: >60 mL/min (ref >60)
GFR calc non Af Amer: 52 mL/min — ABNORMAL LOW (ref >60)
Glucose, Bld: 128 mg/dL — ABNORMAL HIGH (ref 70–99)

## 2010-11-08 LAB — CBC
HCT: 35.5 % — ABNORMAL LOW (ref 36.0–46.0)
Hemoglobin: 11.9 g/dL — ABNORMAL LOW (ref 12.0–15.0)
MCH: 32.2 pg (ref 26.0–34.0)
MCHC: 33.5 g/dL (ref 30.0–36.0)
MCV: 95.9 fL (ref 78.0–100.0)
RDW: 12.7 % (ref 11.5–15.5)

## 2010-11-08 LAB — URINE MICROSCOPIC-ADD ON

## 2010-11-08 LAB — CARDIAC PANEL(CRET KIN+CKTOT+MB+TROPI): Total CK: 76 U/L (ref 7–177)

## 2010-11-08 NOTE — H&P (Signed)
NAMEJABREE, [name]             ACCOUNT NO.:  [id number]  MEDICAL RECORD NO.:  [id number]  LOCATION:  [facility]                         FACILITY:  [facility]  PHYSICIAN:  Celso Amy, MD   DATE OF BIRTH:  August 31, 1920  DATE OF ADMISSION:  11/08/2010 DATE OF DISCHARGE:                             HISTORY & PHYSICAL   PRIMARY CARE DOCTOR:  Dr. Milinda Antis at [facility].  CHIEF COMPLAINT:  Syncope.  HISTORY OF PRESENT ILLNESS:  The patient is an 77 year old white female with past medical history of hypertension, who presented to the ER with chief complaint of syncope.  History of present illness dates back to this a.m. when the patient was at church she felt hot and passed out for few seconds.  No complaint of head trauma.  No complaint of chest pain. No complaint of shortness of breath.  No complaint of nausea, vomiting, or abdominal pain.  No complaint of fever, cough, or chills.  No complaint of frequency, urgency, or dysuria.  No complaint of visible bleeding from any site.  The patient had had a similar episode last year in June.  The patient says that she passed out for only few seconds.  REVIEW OF SYSTEMS:  Negative besides the HPI.  ALLERGIES:  The patient has no known drug allergies.  FAMILY HISTORY:  Father died at the age of 20 from MI.  Mother died at the age of 50 from congestive heart failure.  SOCIAL HISTORY:  The patient lives alone, is able to perform her ADLs and IADLs.  Nonsmoker, nondrinker.  PAST MEDICAL HISTORY: 1. Hypertension. 2. Hyperlipidemia. 3. Osteoporosis. 4. Osteoarthritis. 5. Status post total hip replacement. 6. No history of coronary artery disease, CVA, or diabetes mellitus.  MEDICATIONS AS AN OUTPATIENT: 1. Hydrocodone/Tylenol 2.5/500 mg p.o. q.6 h. as needed. 2. Lovastatin 20 mg p.o. daily. 3. Hydrochlorothiazide 25 mg p.o. daily. 4. Fish oil over-the-counter 1 capsule p.o. b.i.d. 5. Benicar 40 mg p.o. daily. 6. Calcium over-the-counter 1  tablet p.o. b.i.d. 7. Vitamin E over-the-counter 1 tablet daily. 8. Vitamin C over-the-counter 1 tablet p.o. daily. 9. Aspirin 81 mg p.o. daily. 10.B12 1 tablet p.o. b.i.d.  PHYSICAL EXAMINATION:  VITAL SIGNS:  Blood pressure 120/33, pulse 79, respiratory rate 20, and temperature afebrile.  Orthostatic change was of only 10 mm in systolic blood pressure. GENERAL:  The patient is awake, alert, oriented to time, place and person, resting comfortably on the bed. HEENT:  The patient is hard of hearing.  Otherwise, head is atraumatic and normocephalic.  Pupils equally reactive to light and accommodation. Extraocular muscles intact. NECK:  Supple. RESPIRATORY:  No acute respiratory distress. CHEST:  Clear to auscultation bilaterally. CARDIOVASCULAR:  S1 and S2, regular rate and rhythm.  No murmur or rub appreciated. GI:  Bowel sounds present.  Abdomen is soft, nontender, and nondistended. EXTREMITIES:  No lower extremity edema or cyanosis was seen. CNS: Cranial nerves II-XII are grossly intact.  No focal motor deficit was seen.  The patient is moving all 4 extremities.  Strength is 5/5 both upper and lower extremities. PSYCH:  Normal mood and affect.  LABORATORY DATA:  Sodium 135, potassium is 4, serum chloride 100, bicarb 24, BUN  32, serum creatinine of 1, glucose 128.  The patient's hemoglobin is 11.9, platelets 205, WBC 8.4.  The patient's calcium is 10.  The patient UA is only showed trace leukocytes and microscopic examination reveals only 0-2.  Troponin negative.  EKG showed right bundle-branch block and left anterior fascicular branch block and there is no old EKGs to compare with.  IMPRESSION: 1. CNS:  The patient being admitted with this syncope, etiology could     be cardiac versus CNS versus volume related.  2. Cardiovascular: The patient is hemodynamically stable-there was only 10 mm     drop in blood pressure on orthostatic measurement.  3. Osteoarthritis.  The  patient has a history of osteoarthritis, is     currently stable.  4. Deep venous thrombosis.  The patient will be kept on DVT     prophylaxis.  PLAN: 1. We will admit the patient to tele. 2. We will cycle trops. 3. We will get 2-D echo. 4. We will get a CT head. 5. We will ask for chest x-ray. 6. We will hold the patient's diuretics. 7. We will decrease the dose of ARB. 8. We will ask for PT/OT eval. 9. We will keep the patient on DVT prophylaxis. 10.The patient's further treatment depends how she does with this     plan.     Celso Amy, MD     MB/MEDQ  D:  11/08/2010  T:  11/08/2010  Job:  [id number]  Electronically Signed by Celso Amy M.D. on 11/08/2010 05:06:41 PM

## 2010-11-09 ENCOUNTER — Inpatient Hospital Stay (HOSPITAL_COMMUNITY): Payer: Medicare Other

## 2010-11-09 ENCOUNTER — Encounter (HOSPITAL_COMMUNITY): Payer: Self-pay

## 2010-11-09 DIAGNOSIS — R55 Syncope and collapse: Secondary | ICD-10-CM

## 2010-11-09 LAB — CARDIAC PANEL(CRET KIN+CKTOT+MB+TROPI)
CK, MB: 2.4 ng/mL (ref 0.3–4.0)
CK, MB: 2.5 ng/mL (ref 0.3–4.0)
Total CK: 72 U/L (ref 7–177)
Troponin I: <0.3 ng/mL (ref <0.30)

## 2010-11-09 LAB — BASIC METABOLIC PANEL
BUN: 25 mg/dL — ABNORMAL HIGH (ref 6–23)
Calcium: 9.6 mg/dL (ref 8.4–10.5)
Creatinine, Ser: 0.82 mg/dL (ref 0.50–1.10)
GFR calc non Af Amer: >60 mL/min (ref >60)
Glucose, Bld: 111 mg/dL — ABNORMAL HIGH (ref 70–99)
Sodium: 139 mEq/L (ref 135–145)

## 2010-11-09 LAB — HEMOGLOBIN A1C: Hgb A1c MFr Bld: 6.7 % — ABNORMAL HIGH (ref <5.7)

## 2010-11-15 LAB — CULTURE, BLOOD (ROUTINE X 2)
Culture  Setup Time: 201208130022
Culture: NO GROWTH

## 2010-11-18 NOTE — Discharge Summary (Signed)
  NAMECHANTELE, CORADO             ACCOUNT NO.:  [id number]  MEDICAL RECORD NO.:  [id number]  LOCATION:  3742                         FACILITY:  [facility]  PHYSICIAN:  Lonia Blood, M.D.       DATE OF BIRTH:  11/28/1920  DATE OF ADMISSION:  11/08/2010 DATE OF DISCHARGE:  11/10/2010                              DISCHARGE SUMMARY   PRIMARY CARE PHYSICIAN:  Marne A. Tower, MD  DISCHARGE DIAGNOSES: 1. Syncope. 2. Hypertension. 3. Hyperlipidemia.  DISCHARGE MEDICATIONS: 1. Tylenol 650 mg every 4 hours as needed for pain. 2. Aspirin 81 mg daily. 3. Benicar 40 mg daily. 4. Calcium 1 tablet twice a day. 5. Fish oil 1 capsule twice a day. 6. Lovastatin 20 mg daily. 7. Vitamin B12 one tablet twice a day. 8. Vitamin C over-the-counter 1 tablet daily. 9. Vitamin E 1 tablet daily.  CONDITION ON DISCHARGE:  The patient was discharged in good condition. At the time of discharge, she was alert and in no acute distress.  Vital signs were stable.  She was told not to drive for 1 month.  She was told to follow up with her primary care physician, Dr. Roxy Manns.  The patient underwent an echocardiogram, which showed preserved ejection fraction, no regional wall motion abnormalities.  HISTORY AND PHYSICAL:  Refer to dictated H and P done by Dr. Wolfgang Phoenix.  HOSPITAL COURSE:  Ms. Gargus was admitted with episode of syncope at church.  She was observed for 48 hours without any arrhythmia in the hospital.  She had an echocardiogram, which did not show any regional wall motion abnormalities or depressed ejection fraction.  She had no significant valve abnormalities.  She had a head CT, which was negative for stroke.  She had frequent neurological examinations without signs of cerebrovascular event.  She remained alert and oriented throughout this hospitalization. She was evaluated by Physical Therapy and Occupational Therapy who felt she had no outpatient needs.  We have decided to discontinue one of  her antihypertensive and discharge her home on Benicar alone.     Lonia Blood, M.D.     SL/MEDQ  D:  11/15/2010  T:  11/15/2010  Job:  [id number]  cc:   Marne A. Milinda Antis, MD  Electronically Signed by Lonia Blood M.D. on 11/18/2010 02:19:55 PM

## 2010-11-20 ENCOUNTER — Encounter: Payer: Self-pay | Admitting: Family Medicine

## 2010-11-20 ENCOUNTER — Ambulatory Visit (INDEPENDENT_AMBULATORY_CARE_PROVIDER_SITE_OTHER): Payer: Medicare Other | Admitting: Family Medicine

## 2010-11-20 DIAGNOSIS — M949 Disorder of cartilage, unspecified: Secondary | ICD-10-CM

## 2010-11-20 DIAGNOSIS — R55 Syncope and collapse: Secondary | ICD-10-CM

## 2010-11-20 DIAGNOSIS — I10 Essential (primary) hypertension: Secondary | ICD-10-CM

## 2010-11-20 DIAGNOSIS — I6529 Occlusion and stenosis of unspecified carotid artery: Secondary | ICD-10-CM

## 2010-11-20 DIAGNOSIS — M899 Disorder of bone, unspecified: Secondary | ICD-10-CM

## 2010-11-20 MED ORDER — OLMESARTAN MEDOXOMIL 40 MG PO TABS: 40.0000 mg | ORAL_TABLET | Freq: Every day | ORAL | Status: DC

## 2010-11-20 MED ORDER — LOVASTATIN 20 MG PO TABS: 20.0000 mg | ORAL_TABLET | Freq: Every day | ORAL | Status: DC

## 2010-11-20 NOTE — Assessment & Plan Note (Signed)
Pt stopped her fosamax due to leg tingling  [place] Spaniel she felt better off of it

## 2010-11-20 NOTE — Progress Notes (Signed)
Subjective:    Patient ID: [name], female    DOB: 27-Apr-1920, 77 y.o.   MRN: [id number]  HPI Pt is here from recent [facility] d/c after syncopal episode at church 8/12-- discharged 2 d later  According to the hosp records this is 2nd instance but niece in hallway mentions this has happened about 4 times Unfortunately her poa cannot be here today and she refuses to let other family members back in the room with her  Pt states she has passed out 3-4 times in the past -- at home - again for a second or 2   Did not hurt herself or hit her head  Man behind her caught her   Her benicar was elevated to 40 mg from 20    Pt was standing in church - got hot and passed out for a few seconds  Was worked up in [employer] cardiac enzymes with RBBB on EKG Orthostatic change of onnly 10pts with bp Monitor neg for arrhythmia, had neg echo, neg CT of head and neg blood cultures BUN was 32 Suspected _ ? Dehydration or low bp Her hctz was d/c Kept her on benicar (per pt went up to 40 mg and took it all)   She stopped her fosamax because she wanted to   Patient Active Problem List  Diagnoses  . VITAMIN B12 DEFICIENCY  . HYPERLIPIDEMIA  . UNSPECIFIED ANEMIA  . HYPERTENSION  . CAROTID ARTERY STENOSIS  . CONSTIPATION, CHRONIC  . OSTEOARTHRITIS  . OSTEOPENIA  . GAIT DISTURBANCE  . Rash and nonspecific skin eruption  . Syncope   Past Medical History  Diagnosis Date  . HLD (hyperlipidemia) 2002  . HTN (hypertension)   . Osteoarthritis   . Osteopenia     with arm fracture  . Carotid stenosis   . Chronic constipation    Past Surgical History  Procedure Date  . Colonoscopy 8/02    Diverticulosis  . Dexa 08/2002    Osteopenia  . Total hip arthroplasty 6/06  . Total hip arthroplasty 10/06  . Carotid doppler 11/07    50-79 % RICA  . Carotid doppler     50-69 % RICA, lessa then 50% LICA-overall no change  . Abd [place] 12/07    Negative AAA  . Dexa 4/10    Osteopenia (worse at forearm  T-1.98)-imp at LS -1.0   History  Substance Use Topics  . Smoking status: Never Smoker   . Smokeless tobacco: Not on file  . Alcohol Use: No   Family History  Problem Relation Age of Onset  . Heart attack Father   . Mitral valve prolapse Sister    Allergies  Allergen Reactions  . Fosamax     Leg tingling   . Hctz (Hydrochlorothiazide)     Dehydration/ syncope  . Hydrocodone     Syncope    Current Outpatient Prescriptions on File Prior to Visit  Medication Sig Dispense Refill  . aspirin 81 MG tablet Take 81 mg by mouth daily.        Marland Kitchen CALCIUM-VITAMIN D PO Take 1 tablet by mouth 2 (two) times daily with a meal.        . Vitamins-Lipotropics (B-100 COMPLEX) TABS Take 1 tablet by mouth 2 (two) times daily.        . diphenhydrAMINE (BENADRYL) 25 mg capsule Take 25 mg by mouth daily as needed.        . mometasone (ELOCON) 0.1 % cream Apply to affected area  daily  45 g  1    2nd bp today is much better      Review of Systems Review of Systems  Constitutional: Negative for fever, appetite change, fatigue and unexpected weight change.  Eyes: Negative for pain and visual disturbance.  Respiratory: Negative for cough and shortness of breath.   Cardiovascular: Negative. For cp or palpitatoins   Gastrointestinal: Negative for nausea, diarrhea and constipation.  Genitourinary: Negative for urgency and frequency.  Skin: Negative for pallor. or rash  Neurological: Negative for weakness, light-headedness, numbness and headaches. - feels fine now  Hematological: Negative for adenopathy. Does not bruise/bleed easily.  Psychiatric/Behavioral: Negative for dysphoric mood. The patient is not nervous/anxious.          Objective:   Physical Exam  Constitutional: She appears well-developed and well-nourished. No distress.  HENT:  Head: Normocephalic and atraumatic.  Right Ear: External ear normal.  Left Ear: External ear normal.  Nose: Nose normal.  Mouth/Throat: Oropharynx is  clear and moist.  Eyes: Conjunctivae and EOM are normal. Pupils are equal, round, and reactive to light.  Neck: Normal range of motion. Neck supple. No JVD present. Carotid bruit is present. No thyromegaly present.       Baseline carotid bruit L  Cardiovascular: Normal rate, regular rhythm and intact distal pulses.  Exam reveals no gallop and no friction rub.   No murmur heard. Pulmonary/Chest: Effort normal and breath sounds normal. No respiratory distress. She has no wheezes.  Abdominal: Soft. Bowel sounds are normal. She exhibits no distension, no abdominal bruit and no mass. There is no tenderness.  Musculoskeletal: Normal range of motion. She exhibits no edema and no tenderness.  Lymphadenopathy:    She has no cervical adenopathy.  Neurological: She is alert. She has normal reflexes. She displays no tremor. No cranial nerve deficit or sensory deficit. She exhibits normal muscle tone. She displays a negative Romberg sign. She displays no seizure activity. Coordination and gait normal.       Walks well with a cane No orthostatic bp changes   Skin: Skin is warm and dry. No rash noted. No erythema. No pallor.  Psychiatric: She has a normal mood and affect.          Assessment & Plan:

## 2010-11-20 NOTE — Patient Instructions (Addendum)
Stay off the hctz - it may have dehydrated you and caused you to faint (fluid pill)  Stay off the strong pain medicine -- that may have also caused you to faint (plain tylenol is ok for pain ) Stay off the fosamax since it caused you to have leg tingling Stay on the benicar 40 mg - your blood pressure is good on that - blood pressure is fine now  I reviewed your hospital records that are re assuring  If you faint again - let me know immediately  Follow up with me for blood pressure visit in 2 months - and bring your power of attorney if you can  Continue drinking more water-this is important

## 2010-11-20 NOTE — Assessment & Plan Note (Signed)
Followed every 6 mo by cardiol/ vasc

## 2010-11-20 NOTE — Assessment & Plan Note (Signed)
Now off hct and on full benicar 40  Feels good- no further syncope and drinking more water  bp good on 2nd check  F/u 2 mo- asked her to also bring her poa to rev hosp records as well

## 2010-11-20 NOTE — Assessment & Plan Note (Signed)
With brief loc after standing and getting hot in church Suspect rel to dehydration  Rev hosp notes in detail - full cardiol w/u -- neg monitor/ baseline ekg with RBBB/ neg cardiac enzymes / neg echo and CT head  Also bun slt high  Is better after dc her hctz (and inc benicar) and also dc her narcotic pain med (I agree with this) Will continue to follow closely She will alert me if this happens again and bring her POA to next visit  inst to drink more water - pt is already doing this

## 2010-12-31 ENCOUNTER — Encounter: Payer: Self-pay | Admitting: Family Medicine

## 2011-01-20 ENCOUNTER — Encounter: Payer: Self-pay | Admitting: Family Medicine

## 2011-01-20 ENCOUNTER — Ambulatory Visit (INDEPENDENT_AMBULATORY_CARE_PROVIDER_SITE_OTHER): Payer: Medicare Other | Admitting: Family Medicine

## 2011-01-20 VITALS — BP 140/65 | HR 80 | Temp 97.9°F | Ht 66.0 in | Wt 134.5 lb

## 2011-01-20 DIAGNOSIS — E785 Hyperlipidemia, unspecified: Secondary | ICD-10-CM

## 2011-01-20 DIAGNOSIS — M1990 Unspecified osteoarthritis, unspecified site: Secondary | ICD-10-CM

## 2011-01-20 DIAGNOSIS — I10 Essential (primary) hypertension: Secondary | ICD-10-CM

## 2011-01-20 MED ORDER — DICLOFENAC SODIUM 1 % TD GEL: 1.0000 "application " | Freq: Four times a day (QID) | TRANSDERMAL | Status: DC | PRN

## 2011-01-20 NOTE — Assessment & Plan Note (Signed)
Knees are bothering her more- but in light of previous syncope times 2 and fall risk no longer feel comfortable px this  Tylenol is of limited benefit Do not rec oral nsaids Will try voltaren gel to knees and see if helpful

## 2011-01-20 NOTE — Progress Notes (Signed)
Subjective:    Patient ID: [name], female    DOB: 1920-10-23, 77 y.o.   MRN: [id number]  HPI Here for f/u of HTN and previous syncopal episode  Is feeling pretty good overall  She did not bring her POA today   Her one sister is taking care of another sister  They could not come - [name] is the one   Has not had any more fainting episodes  No dizziness  Sleeps pretty good if she avoids caffeine   No trouble with other meds - statin or benicar  No cp or palpitations or edema Is not constipated any more    At last visit rev hosp notes about syncopal episode at church- concluded it could have been from dehydration so her diuretic was stopped (hctz) benicar was inc to 40 for bp - and she was tol that well  (and her 2nd bp is much improved after sitting as usual) In light of syncope we also stopped her narcotic pain med  She is taking aleve 2 per day - not really helping - was unaware that this could inc bp  She needs something because her legs hurt so bad     Today first bp is 158/72 Wt is up 2 lb   Patient Active Problem List  Diagnoses  . VITAMIN B12 DEFICIENCY  . HYPERLIPIDEMIA  . UNSPECIFIED ANEMIA  . HYPERTENSION  . CAROTID ARTERY STENOSIS  . CONSTIPATION, CHRONIC  . OSTEOARTHRITIS  . OSTEOPENIA  . GAIT DISTURBANCE  . Rash and nonspecific skin eruption  . Syncope   Past Medical History  Diagnosis Date  . HLD (hyperlipidemia) 2002  . HTN (hypertension)   . Osteoarthritis   . Osteopenia     with arm fracture  . Carotid stenosis   . Chronic constipation    Past Surgical History  Procedure Date  . Colonoscopy 8/02    Diverticulosis  . Dexa 08/2002    Osteopenia  . Total hip arthroplasty 6/06  . Total hip arthroplasty 10/06  . Carotid doppler 11/07    50-79 % RICA  . Carotid doppler     50-69 % RICA, lessa then 50% LICA-overall no change  . Abd [place] 12/07    Negative AAA  . Dexa 4/10    Osteopenia (worse at forearm T-1.98)-imp at LS -1.0    History  Substance Use Topics  . Smoking status: Never Smoker   . Smokeless tobacco: Not on file  . Alcohol Use: No   Family History  Problem Relation Age of Onset  . Heart attack Father   . Mitral valve prolapse Sister    Allergies  Allergen Reactions  . Fosamax     Leg tingling   . Hctz (Hydrochlorothiazide)     Dehydration/ syncope  . Hydrocodone     Syncope    Current Outpatient Prescriptions on File Prior to Visit  Medication Sig Dispense Refill  . aspirin 81 MG tablet Take 81 mg by mouth daily.        Marland Kitchen CALCIUM-VITAMIN D PO Take 1 tablet by mouth 2 (two) times daily with a meal.        . docusate sodium (COLACE) 50 MG capsule Take 50-100 mg by mouth 2 (two) times daily.        Marland Kitchen lovastatin (MEVACOR) 20 MG tablet Take 1 tablet (20 mg total) by mouth daily.  30 tablet  11  . olmesartan (BENICAR) 40 MG tablet Take 1 tablet (40 mg total) by  mouth daily.  30 tablet  11  . Omega-3 Fatty Acids (FISH OIL PO) Take 1 capsule by mouth 2 (two) times daily.        . vitamin E 400 UNIT capsule Take 400 Units by mouth daily.        . Vitamins-Lipotropics (B-100 COMPLEX) TABS Take 1 tablet by mouth 2 (two) times daily.        . diphenhydrAMINE (BENADRYL) 25 mg capsule Take 25 mg by mouth daily as needed.        . mometasone (ELOCON) 0.1 % cream Apply to affected area daily  45 g  1     Review of Systems Review of Systems  Constitutional: Negative for fever, appetite change, fatigue and unexpected weight change.  Eyes: Negative for pain and visual disturbance.  Respiratory: Negative for cough and shortness of breath.   Cardiovascular: Negative for cp or palpitations    Gastrointestinal: Negative for nausea, diarrhea and constipation.  Genitourinary: Negative for urgency and frequency.  Skin: Negative for pallor or rash   Neurological: Negative for weakness, light-headedness, numbness and headaches.  Hematological: Negative for adenopathy. Does not bruise/bleed easily.   Psychiatric/Behavioral: Negative for dysphoric mood. The patient is not nervous/anxious.          Objective:   Physical Exam  Constitutional: She appears well-developed and well-nourished. No distress.       Well appearing elderly female  HENT:  Head: Normocephalic and atraumatic.  Mouth/Throat: Oropharynx is clear and moist.  Eyes: Conjunctivae and EOM are normal. Pupils are equal, round, and reactive to light.  Neck: Normal range of motion. Neck supple. No JVD present. Carotid bruit is not present. No thyromegaly present.  Cardiovascular: Normal rate, regular rhythm, normal heart sounds and intact distal pulses.   Pulmonary/Chest: Effort normal and breath sounds normal. No respiratory distress. She has no wheezes.  Abdominal: Soft. Bowel sounds are normal. She exhibits no distension, no abdominal bruit and no mass. There is no tenderness.  Musculoskeletal: She exhibits no edema and no tenderness.       Has joint changes of OA - especially knees   Lymphadenopathy:    She has no cervical adenopathy.  Neurological: She is alert. She has normal reflexes. She exhibits normal muscle tone. Coordination normal.  Skin: Skin is warm and dry. No rash noted. No erythema. No pallor.  Psychiatric: She has a normal mood and affect.       Hard of hearing but mentally sharp          Assessment & Plan:

## 2011-01-20 NOTE — Assessment & Plan Note (Signed)
Will check labs in 6 mo on mevacor before office visit Has a healthy diet

## 2011-01-20 NOTE — Assessment & Plan Note (Signed)
As usual- bp much better on 2nd check 140/65 at rest Will stick with benicar alone for now and enc good fluid intake Lab and f/u 6 mo Also inst to stop nsaids due to poss for inc bp and stroke risk

## 2011-01-20 NOTE — Patient Instructions (Addendum)
Blood pressure is good today - continue your benicar for that  For the arthritis in your knees - try the voltaren gel  Do not take aleve - it can increase blood pressure and stroke risk Tylenol is ok  Try to rest your knees when you can  Schedule follow up in 6 months with labs prior

## 2011-07-07 ENCOUNTER — Other Ambulatory Visit (INDEPENDENT_AMBULATORY_CARE_PROVIDER_SITE_OTHER): Payer: Medicare Other

## 2011-07-07 DIAGNOSIS — E785 Hyperlipidemia, unspecified: Secondary | ICD-10-CM

## 2011-07-07 DIAGNOSIS — I10 Essential (primary) hypertension: Secondary | ICD-10-CM | POA: Diagnosis not present

## 2011-07-07 LAB — COMPREHENSIVE METABOLIC PANEL
Albumin: 4 g/dL (ref 3.5–5.2)
BUN: 23 mg/dL (ref 6–23)
CO2: 25 mEq/L (ref 19–32)
Calcium: 9.3 mg/dL (ref 8.4–10.5)
Chloride: 106 mEq/L (ref 96–112)
Creatinine, Ser: 0.8 mg/dL (ref 0.4–1.2)
GFR: 70.75 mL/min (ref >60.00)
Potassium: 4.1 mEq/L (ref 3.5–5.1)

## 2011-07-07 LAB — LIPID PANEL
HDL: 55.7 mg/dL (ref >39.00)
Total CHOL/HDL Ratio: 3
Triglycerides: 119 mg/dL (ref 0.0–149.0)

## 2011-07-14 ENCOUNTER — Encounter: Payer: Self-pay | Admitting: Family Medicine

## 2011-07-14 ENCOUNTER — Ambulatory Visit (INDEPENDENT_AMBULATORY_CARE_PROVIDER_SITE_OTHER): Payer: Medicare Other | Admitting: Family Medicine

## 2011-07-14 VITALS — BP 142/62 | HR 64 | Temp 98.0°F | Ht 66.0 in | Wt 140.0 lb

## 2011-07-14 DIAGNOSIS — I10 Essential (primary) hypertension: Secondary | ICD-10-CM | POA: Diagnosis not present

## 2011-07-14 DIAGNOSIS — E785 Hyperlipidemia, unspecified: Secondary | ICD-10-CM

## 2011-07-14 NOTE — Assessment & Plan Note (Signed)
In good control with mevacor and diet  Disc goals for lipids and reasons to control them Rev labs with pt Rev low sat fat diet in detail  F/u 6 mo

## 2011-07-14 NOTE — Patient Instructions (Signed)
You are doing well  Keep eating a lot fat and low sugar diet  Stay active  Blood pressure is ok  Follow up in 6 months for annual exam with labs prior

## 2011-07-14 NOTE — Assessment & Plan Note (Signed)
bp in fair control at this time  No changes needed  Disc lifstyle change with low sodium diet and exercise   F/u 6 mo for annual exam

## 2011-07-14 NOTE — Progress Notes (Signed)
Subjective:    Patient ID: [name], female    DOB: April 21, 1920, 78 y.o.   MRN: [id number]  HPI Here for f/u of chronic conditions Is feeling pretty good  Nothing new going on  Gets around slower than she was   bp is  142/62   Today No cp or palpitations or headaches or edema  No side effects to medicines      Chemistry      Component Value Date/Time   NA 141 07/07/2011 0825   K 4.1 07/07/2011 0825   CL 106 07/07/2011 0825   CO2 25 07/07/2011 0825   BUN 23 07/07/2011 0825   CREATININE 0.8 07/07/2011 0825      Component Value Date/Time   CALCIUM 9.3 07/07/2011 0825   ALKPHOS 55 07/07/2011 0825   AST 24 07/07/2011 0825   ALT 15 07/07/2011 0825   BILITOT 0.4 07/07/2011 0825       Wt is up 6 lb with bmi of 22  On mevacor for lipids Lab Results  Component Value Date   CHOL 152 07/07/2011   HDL 55.70 07/07/2011   LDLCALC 73 07/07/2011   LDLDIRECT 70.8 09/05/2009   TRIG 119.0 07/07/2011   CHOLHDL 3 07/07/2011   is fairly controlled  A good profile  Tries to eat right  No trouble with any of her medicines   Patient Active Problem List  Diagnoses  . VITAMIN B12 DEFICIENCY  . HYPERLIPIDEMIA  . UNSPECIFIED ANEMIA  . HYPERTENSION  . CAROTID ARTERY STENOSIS  . CONSTIPATION, CHRONIC  . OSTEOARTHRITIS  . OSTEOPENIA  . GAIT DISTURBANCE  . Rash and nonspecific skin eruption  . Syncope   Past Medical History  Diagnosis Date  . HLD (hyperlipidemia) 2002  . HTN (hypertension)   . Osteoarthritis   . Osteopenia     with arm fracture  . Carotid stenosis   . Chronic constipation    Past Surgical History  Procedure Date  . Colonoscopy 8/02    Diverticulosis  . Dexa 08/2002    Osteopenia  . Total hip arthroplasty 6/06  . Total hip arthroplasty 10/06  . Carotid doppler 11/07    50-79 % RICA  . Carotid doppler     50-69 % RICA, lessa then 50% LICA-overall no change  . Abd [place] 12/07    Negative AAA  . Dexa 4/10    Osteopenia (worse at forearm T-1.98)-imp at LS -1.0    History  Substance Use Topics  . Smoking status: Never Smoker   . Smokeless tobacco: Not on file  . Alcohol Use: No   Family History  Problem Relation Age of Onset  . Heart attack Father   . Mitral valve prolapse Sister    Allergies  Allergen Reactions  . Fosamax     Leg tingling   . Hctz (Hydrochlorothiazide)     Dehydration/ syncope  . Hydrocodone     Syncope    Current Outpatient Prescriptions on File Prior to Visit  Medication Sig Dispense Refill  . aspirin 81 MG tablet Take 81 mg by mouth daily.        Marland Kitchen CALCIUM-VITAMIN D PO Take 1 tablet by mouth 2 (two) times daily with a meal.        . diclofenac sodium (VOLTAREN) 1 % GEL Apply 1 application topically 4 (four) times daily as needed.  100 g  11  . diphenhydrAMINE (BENADRYL) 25 mg capsule Take 25 mg by mouth daily as needed.        Marland Kitchen  docusate sodium (COLACE) 50 MG capsule Take 50-100 mg by mouth 2 (two) times daily.        Marland Kitchen lovastatin (MEVACOR) 20 MG tablet Take 1 tablet (20 mg total) by mouth daily.  30 tablet  11  . olmesartan (BENICAR) 40 MG tablet Take 1 tablet (40 mg total) by mouth daily.  30 tablet  11  . Omega-3 Fatty Acids (FISH OIL PO) Take 1 capsule by mouth 2 (two) times daily.        . vitamin E 400 UNIT capsule Take 400 Units by mouth daily.        . Vitamins-Lipotropics (B-100 COMPLEX) TABS Take 1 tablet by mouth 2 (two) times daily.            Review of Systems Review of Systems  Constitutional: Negative for fever, appetite change, fatigue and unexpected weight change.  Eyes: Negative for pain and visual disturbance.  ENT pos for hearing loss  Respiratory: Negative for cough and shortness of breath.   Cardiovascular: Negative for cp or palpitations    Gastrointestinal: Negative for nausea, diarrhea and constipation.  Genitourinary: Negative for urgency and frequency.  Skin: Negative for pallor or rash   Neurological: Negative for weakness, light-headedness, numbness and headaches.   Hematological: Negative for adenopathy. Does not bruise/bleed easily.  Psychiatric/Behavioral: Negative for dysphoric mood. The patient is not nervous/anxious.          Objective:   Physical Exam  Constitutional: She appears well-developed and well-nourished. No distress.       Elderly , HOH  HENT:  Head: Normocephalic and atraumatic.  Mouth/Throat: Oropharynx is clear and moist.       Very hard of hearing   Eyes: Conjunctivae and EOM are normal. Pupils are equal, round, and reactive to light.  Neck: Normal range of motion. Neck supple. No JVD present. Carotid bruit is not present. No thyromegaly present.  Cardiovascular: Normal rate, regular rhythm, normal heart sounds and intact distal pulses.  Exam reveals no gallop.   Pulmonary/Chest: Effort normal and breath sounds normal. No respiratory distress. She has no wheezes. She has no rales.  Abdominal: Soft. Bowel sounds are normal. She exhibits no distension, no abdominal bruit and no mass. There is no tenderness.  Musculoskeletal: She exhibits no edema.  Lymphadenopathy:    She has no cervical adenopathy.  Neurological: She is alert. She has normal reflexes. No cranial nerve deficit. She exhibits normal muscle tone. Coordination normal.  Skin: Skin is warm and dry. No rash noted.  Psychiatric: She has a normal mood and affect.       Pleasant           Assessment & Plan:

## 2011-07-21 DIAGNOSIS — H6120 Impacted cerumen, unspecified ear: Secondary | ICD-10-CM | POA: Diagnosis not present

## 2011-07-21 DIAGNOSIS — H903 Sensorineural hearing loss, bilateral: Secondary | ICD-10-CM | POA: Diagnosis not present

## 2011-08-17 DIAGNOSIS — I739 Peripheral vascular disease, unspecified: Secondary | ICD-10-CM | POA: Diagnosis not present

## 2011-08-17 DIAGNOSIS — I6529 Occlusion and stenosis of unspecified carotid artery: Secondary | ICD-10-CM | POA: Diagnosis not present

## 2011-08-17 DIAGNOSIS — M79609 Pain in unspecified limb: Secondary | ICD-10-CM | POA: Diagnosis not present

## 2011-08-17 DIAGNOSIS — I70219 Atherosclerosis of native arteries of extremities with intermittent claudication, unspecified extremity: Secondary | ICD-10-CM | POA: Diagnosis not present

## 2011-10-15 ENCOUNTER — Ambulatory Visit (INDEPENDENT_AMBULATORY_CARE_PROVIDER_SITE_OTHER): Payer: Medicare Other | Admitting: Family Medicine

## 2011-10-15 ENCOUNTER — Encounter: Payer: Self-pay | Admitting: Family Medicine

## 2011-10-15 VITALS — BP 118/60 | HR 95 | Temp 98.4°F | Ht 67.0 in | Wt 136.0 lb

## 2011-10-15 DIAGNOSIS — M79609 Pain in unspecified limb: Secondary | ICD-10-CM | POA: Diagnosis not present

## 2011-10-15 DIAGNOSIS — R609 Edema, unspecified: Secondary | ICD-10-CM

## 2011-10-15 DIAGNOSIS — M79605 Pain in left leg: Secondary | ICD-10-CM

## 2011-10-15 DIAGNOSIS — M25473 Effusion, unspecified ankle: Secondary | ICD-10-CM

## 2011-10-15 DIAGNOSIS — M79604 Pain in right leg: Secondary | ICD-10-CM | POA: Insufficient documentation

## 2011-10-15 MED ORDER — TRAMADOL HCL 50 MG PO TABS: ORAL_TABLET | ORAL | Status: DC

## 2011-10-15 NOTE — Assessment & Plan Note (Signed)
Ongoing - worse in summer and worse in R  Suggested otc supp hose to knee to start  She has had rxn to hctz with syncope and low bp in past -hesitant to px diuretic No cardiac red flags Will continue to follow

## 2011-10-15 NOTE — Assessment & Plan Note (Addendum)
This is ongoing/ chronic / likely related to DJD in back and knee OA  Has also had hip replacements  Pain pre dates her statin We have to be very cautious of pain meds due to age and fall risk Will try tramadol 50  At 1/2 pill up to tid with caution for dizziness (will update) Enc to elevate feet when able Is ambulating well with cane now

## 2011-10-15 NOTE — Progress Notes (Signed)
Subjective:    Patient ID: [name], female    DOB: 1920/07/27, 78 y.o.   MRN: [id number]  HPI Here for R ankle swelling and pain   Both legs hurt her all the time - chronic - went off hydrocodone  Has had hip replacements in past Known OA  Gets bruised easily with asa No falls lately Taken off hydrocodone   R ankle swelling started about a week ago   No injury  Is very steady with her cane  In past ingol of hctz- syncope   bp is 118/60    Chemistry      Component Value Date/Time   NA 141 07/07/2011 0825   K 4.1 07/07/2011 0825   CL 106 07/07/2011 0825   CO2 25 07/07/2011 0825   BUN 23 07/07/2011 0825   CREATININE 0.8 07/07/2011 0825      Component Value Date/Time   CALCIUM 9.3 07/07/2011 0825   ALKPHOS 55 07/07/2011 0825   AST 24 07/07/2011 0825   ALT 15 07/07/2011 0825   BILITOT 0.4 07/07/2011 0825      Lab Results  Component Value Date   ALT 15 07/07/2011   AST 24 07/07/2011   ALKPHOS 55 07/07/2011   BILITOT 0.4 07/07/2011     Patient Active Problem List  Diagnosis  . VITAMIN B12 DEFICIENCY  . HYPERLIPIDEMIA  . UNSPECIFIED ANEMIA  . HYPERTENSION  . CAROTID ARTERY STENOSIS  . CONSTIPATION, CHRONIC  . OSTEOARTHRITIS  . OSTEOPENIA  . GAIT DISTURBANCE  . Rash and nonspecific skin eruption  . Syncope   Past Medical History  Diagnosis Date  . HLD (hyperlipidemia) 2002  . HTN (hypertension)   . Osteoarthritis   . Osteopenia     with arm fracture  . Carotid stenosis   . Chronic constipation    Past Surgical History  Procedure Date  . Colonoscopy 8/02    Diverticulosis  . Dexa 08/2002    Osteopenia  . Total hip arthroplasty 6/06  . Total hip arthroplasty 10/06  . Carotid doppler 11/07    50-79 % RICA  . Carotid doppler     50-69 % RICA, lessa then 50% LICA-overall no change  . Abd [place] 12/07    Negative AAA  . Dexa 4/10    Osteopenia (worse at forearm T-1.98)-imp at LS -1.0   History  Substance Use Topics  . Smoking status: Never Smoker   .  Smokeless tobacco: Not on file  . Alcohol Use: No   Family History  Problem Relation Age of Onset  . Heart attack Father   . Mitral valve prolapse Sister    Allergies  Allergen Reactions  . Alendronate Sodium     Leg tingling   . Hctz (Hydrochlorothiazide)     Dehydration/ syncope  . Hydrocodone     Syncope    Current Outpatient Prescriptions on File Prior to Visit  Medication Sig Dispense Refill  . aspirin 81 MG tablet Take 81 mg by mouth daily.        Marland Kitchen CALCIUM-VITAMIN D PO Take 1 tablet by mouth 2 (two) times daily with a meal.        . docusate sodium (COLACE) 50 MG capsule Take 50-100 mg by mouth 2 (two) times daily.        Marland Kitchen lovastatin (MEVACOR) 20 MG tablet Take 1 tablet (20 mg total) by mouth daily.  30 tablet  11  . olmesartan (BENICAR) 40 MG tablet Take 1 tablet (40 mg total)  by mouth daily.  30 tablet  11  . Omega-3 Fatty Acids (FISH OIL PO) Take 1 capsule by mouth 2 (two) times daily.        . vitamin E 400 UNIT capsule Take 400 Units by mouth daily.        . Vitamins-Lipotropics (B-100 COMPLEX) TABS Take 1 tablet by mouth 2 (two) times daily.        . diclofenac sodium (VOLTAREN) 1 % GEL Apply 1 application topically 4 (four) times daily as needed.  100 g  11  . diphenhydrAMINE (BENADRYL) 25 mg capsule Take 25 mg by mouth daily as needed.          Review of Systems Review of Systems  Constitutional: Negative for fever, appetite change, fatigue and unexpected weight change.  Eyes: Negative for pain and visual disturbance.  Respiratory: Negative for cough and shortness of breath.   Cardiovascular: Negative for cp or palpitations   neg for PND or orthopnea Gastrointestinal: Negative for nausea, diarrhea and constipation.  Genitourinary: Negative for urgency and frequency.  Skin: Negative for pallor or rash   MSK pos for chronic leg/knee / hip pain / pos for swelling of ankles in the summer time Neurological: Negative for weakness, light-headedness, numbness and  headaches.  Hematological: Negative for adenopathy. Does not bruise/bleed easily.  Psychiatric/Behavioral: Negative for dysphoric mood. The patient is not nervous/anxious.         Objective:   Physical Exam  Constitutional: She appears well-developed and well-nourished. No distress.  HENT:  Head: Normocephalic and atraumatic.  Mouth/Throat: Oropharynx is clear and moist.  Eyes: Conjunctivae and EOM are normal. Pupils are equal, round, and reactive to light. No scleral icterus.  Neck: Normal range of motion. Neck supple. No JVD present. Carotid bruit is not present. No thyromegaly present.  Cardiovascular: Normal rate, regular rhythm, normal heart sounds and intact distal pulses.  Exam reveals no gallop.   Pulmonary/Chest: Effort normal and breath sounds normal. No respiratory distress. She has no rales.  Abdominal: Soft. Bowel sounds are normal. She exhibits no distension, no abdominal bruit and no mass. There is no tenderness.  Musculoskeletal: She exhibits edema. She exhibits no tenderness.       bilat ankle edema - R >L with trace to one plus edema Varicosities present No calf tenderness/ palp cords / homan's sign neg Perfusion intact   Limited rom of hips and knees due to OA No effusion Some crepitus in knees   Lymphadenopathy:    She has no cervical adenopathy.  Neurological: She is alert. She has normal reflexes. No cranial nerve deficit. She exhibits normal muscle tone. Coordination normal.  Skin: Skin is warm and dry. No rash noted. No erythema. No pallor.  Psychiatric: She has a normal mood and affect.          Assessment & Plan:

## 2011-10-15 NOTE — Patient Instructions (Addendum)
For pain - try the ultram (tramadol) 1/2 pill when you need it  Tylenol is also ok  For swelling - get some support stockings to the knee- and elevate feet when you are able  Take care of yourself

## 2011-12-01 DIAGNOSIS — M1710 Unilateral primary osteoarthritis, unspecified knee: Secondary | ICD-10-CM | POA: Diagnosis not present

## 2011-12-21 ENCOUNTER — Other Ambulatory Visit: Payer: Self-pay | Admitting: Family Medicine

## 2011-12-30 DIAGNOSIS — Z23 Encounter for immunization: Secondary | ICD-10-CM | POA: Diagnosis not present

## 2012-01-13 ENCOUNTER — Telehealth: Payer: Self-pay | Admitting: Family Medicine

## 2012-01-13 DIAGNOSIS — E785 Hyperlipidemia, unspecified: Secondary | ICD-10-CM

## 2012-01-13 DIAGNOSIS — I10 Essential (primary) hypertension: Secondary | ICD-10-CM

## 2012-01-13 DIAGNOSIS — E538 Deficiency of other specified B group vitamins: Secondary | ICD-10-CM

## 2012-01-13 DIAGNOSIS — M899 Disorder of bone, unspecified: Secondary | ICD-10-CM

## 2012-01-13 NOTE — Telephone Encounter (Signed)
Message copied by Judy Pimple on Thu Jan 13, 2012  5:34 PM ------      Message from: Alvina Chou      Created: Thu Jan 13, 2012  3:53 PM      Regarding: lab orders for Fri 10.18.13       Labs for f/u appt

## 2012-01-14 ENCOUNTER — Other Ambulatory Visit (INDEPENDENT_AMBULATORY_CARE_PROVIDER_SITE_OTHER): Payer: Medicare Other

## 2012-01-14 DIAGNOSIS — E538 Deficiency of other specified B group vitamins: Secondary | ICD-10-CM | POA: Diagnosis not present

## 2012-01-14 DIAGNOSIS — E785 Hyperlipidemia, unspecified: Secondary | ICD-10-CM | POA: Diagnosis not present

## 2012-01-14 DIAGNOSIS — I10 Essential (primary) hypertension: Secondary | ICD-10-CM

## 2012-01-14 DIAGNOSIS — M899 Disorder of bone, unspecified: Secondary | ICD-10-CM

## 2012-01-14 LAB — COMPREHENSIVE METABOLIC PANEL
ALT: 13 U/L (ref 0–35)
CO2: 24 mEq/L (ref 19–32)
Calcium: 9.6 mg/dL (ref 8.4–10.5)
Chloride: 106 mEq/L (ref 96–112)
Creatinine, Ser: 1.1 mg/dL (ref 0.4–1.2)
GFR: 51.81 mL/min — ABNORMAL LOW (ref >60.00)
Glucose, Bld: 114 mg/dL — ABNORMAL HIGH (ref 70–99)
Sodium: 139 mEq/L (ref 135–145)
Total Bilirubin: 0.6 mg/dL (ref 0.3–1.2)
Total Protein: 7 g/dL (ref 6.0–8.3)

## 2012-01-14 LAB — CBC WITH DIFFERENTIAL/PLATELET
Basophils Absolute: 0 10*3/uL (ref 0.0–0.1)
Eosinophils Relative: 1.3 % (ref 0.0–5.0)
HCT: 40.5 % (ref 36.0–46.0)
Lymphocytes Relative: 25.2 % (ref 12.0–46.0)
Lymphs Abs: 1.8 10*3/uL (ref 0.7–4.0)
Monocytes Relative: 9.8 % (ref 3.0–12.0)
Platelets: 210 10*3/uL (ref 150.0–400.0)
RDW: 13.2 % (ref 11.5–14.6)
WBC: 7.1 10*3/uL (ref 4.5–10.5)

## 2012-01-14 LAB — LIPID PANEL: Total CHOL/HDL Ratio: 3

## 2012-01-15 LAB — VITAMIN D 25 HYDROXY (VIT D DEFICIENCY, FRACTURES): Vit D, 25-Hydroxy: 45 ng/mL (ref 30–89)

## 2012-01-21 ENCOUNTER — Ambulatory Visit (INDEPENDENT_AMBULATORY_CARE_PROVIDER_SITE_OTHER): Payer: Medicare Other | Admitting: Family Medicine

## 2012-01-21 ENCOUNTER — Encounter: Payer: Self-pay | Admitting: Family Medicine

## 2012-01-21 VITALS — BP 138/70 | HR 68 | Temp 97.3°F | Ht 67.0 in | Wt 128.8 lb

## 2012-01-21 DIAGNOSIS — R7309 Other abnormal glucose: Secondary | ICD-10-CM | POA: Diagnosis not present

## 2012-01-21 DIAGNOSIS — E785 Hyperlipidemia, unspecified: Secondary | ICD-10-CM

## 2012-01-21 DIAGNOSIS — E538 Deficiency of other specified B group vitamins: Secondary | ICD-10-CM

## 2012-01-21 DIAGNOSIS — I10 Essential (primary) hypertension: Secondary | ICD-10-CM | POA: Diagnosis not present

## 2012-01-21 DIAGNOSIS — R739 Hyperglycemia, unspecified: Secondary | ICD-10-CM | POA: Insufficient documentation

## 2012-01-21 MED ORDER — TRAMADOL HCL 50 MG PO TABS: ORAL_TABLET | ORAL | Status: DC

## 2012-01-21 NOTE — Progress Notes (Signed)
Subjective:    Patient ID: [name], female    DOB: 05-19-1920, 78 y.o.   MRN: [id number]  HPI Here for f/u of chronic medical problems  Wt is down 8lb Does not know why , eating the same  Is active -- does her housework -- by herself Does not eat a lot of sugar -some sweets / desserts   Sugar is 114- high for her   D level ok   B12 level is not low  Lab Results  Component Value Date   CHOL 133 01/14/2012   CHOL 152 07/07/2011   CHOL 141 08/07/2010   Lab Results  Component Value Date   HDL 50.00 01/14/2012   HDL 55.70 07/07/2011   HDL 82.95 08/07/2010   Lab Results  Component Value Date   LDLCALC 63 01/14/2012   LDLCALC 73 07/07/2011   LDLCALC 71 08/07/2010   Lab Results  Component Value Date   TRIG 98.0 01/14/2012   TRIG 119.0 07/07/2011   TRIG 129.0 08/07/2010   Lab Results  Component Value Date   CHOLHDL 3 01/14/2012   CHOLHDL 3 07/07/2011   CHOLHDL 3 08/07/2010   Lab Results  Component Value Date   LDLDIRECT 70.8 09/05/2009   LDLDIRECT 61.2 03/05/2009   stable overall and well controlled  Is at goal   bp is up today  No cp or palpitations or headaches or edema  No side effects to medicines  BP Readings from Last 3 Encounters:  01/21/12 152/74  10/15/11 118/60  07/14/11 142/62    At home is 130/70s 2nd check here 138/70 Tramadol - does work well for her pain and no dizziness at all - takes bid to tid  Takes tylenol occas  Patient Active Problem List  Diagnosis  . VITAMIN B12 DEFICIENCY  . HYPERLIPIDEMIA  . UNSPECIFIED ANEMIA  . HYPERTENSION  . CAROTID ARTERY STENOSIS  . CONSTIPATION, CHRONIC  . OSTEOARTHRITIS  . OSTEOPENIA  . Rash and nonspecific skin eruption  . Syncope  . Leg pain, bilateral  . Ankle edema  . Hyperglycemia   Past Medical History  Diagnosis Date  . HLD (hyperlipidemia) 2002  . HTN (hypertension)   . Osteoarthritis   . Osteopenia     with arm fracture  . Carotid stenosis   . Chronic constipation    Past  Surgical History  Procedure Date  . Colonoscopy 8/02    Diverticulosis  . Dexa 08/2002    Osteopenia  . Total hip arthroplasty 6/06  . Total hip arthroplasty 10/06  . Carotid doppler 11/07    50-79 % RICA  . Carotid doppler     50-69 % RICA, lessa then 50% LICA-overall no change  . Abd [place] 12/07    Negative AAA  . Dexa 4/10    Osteopenia (worse at forearm T-1.98)-imp at LS -1.0   History  Substance Use Topics  . Smoking status: Never Smoker   . Smokeless tobacco: Not on file  . Alcohol Use: No   Family History  Problem Relation Age of Onset  . Heart attack Father   . Mitral valve prolapse Sister    Allergies  Allergen Reactions  . Alendronate Sodium     Leg tingling   . Hctz (Hydrochlorothiazide)     Dehydration/ syncope  . Hydrocodone     Syncope    Current Outpatient Prescriptions on File Prior to Visit  Medication Sig Dispense Refill  . aspirin 81 MG tablet Take 81 mg by mouth daily.        Marland Kitchen  BENICAR 40 MG tablet TAKE ONE (1) TABLET BY MOUTH EVERY      DAY  30 tablet  3  . CALCIUM-VITAMIN D PO Take 1 tablet by mouth 2 (two) times daily with a meal.        . diphenhydrAMINE (BENADRYL) 25 mg capsule Take 25 mg by mouth daily as needed.        . docusate sodium (COLACE) 50 MG capsule Take 50-100 mg by mouth 2 (two) times daily.        Marland Kitchen lovastatin (MEVACOR) 20 MG tablet TAKE ONE (1) TABLET BY MOUTH EVERY      DAY  30 tablet  3  . Omega-3 Fatty Acids (FISH OIL PO) Take 1 capsule by mouth 2 (two) times daily.        . vitamin E 400 UNIT capsule Take 400 Units by mouth daily.        . Vitamins-Lipotropics (B-100 COMPLEX) TABS Take 1 tablet by mouth 2 (two) times daily.            Review of Systems Review of Systems  Constitutional: Negative for fever, appetite change, fatigue and unexpected weight change.  Eyes: Negative for pain and visual disturbance.  Respiratory: Negative for cough and shortness of breath.   Cardiovascular: Negative for cp or palpitations      Gastrointestinal: Negative for nausea, diarrhea and constipation.  Genitourinary: Negative for urgency and frequency.  Skin: Negative for pallor or rash   Neurological: Negative for weakness, light-headedness, numbness and headaches.  Hematological: Negative for adenopathy. Does not bruise/bleed easily.  Psychiatric/Behavioral: Negative for dysphoric mood. The patient is not nervous/anxious.         Objective:   Physical Exam  Constitutional: She appears well-developed and well-nourished. No distress.  HENT:  Head: Normocephalic and atraumatic.  Mouth/Throat: Oropharynx is clear and moist.       Very hard of hearing   Eyes: Conjunctivae normal and EOM are normal. Pupils are equal, round, and reactive to light. Right eye exhibits no discharge. Left eye exhibits no discharge. No scleral icterus.  Neck: Normal range of motion. Neck supple. No JVD present. Carotid bruit is not present. No thyromegaly present.  Cardiovascular: Normal rate, regular rhythm, normal heart sounds and intact distal pulses.  Exam reveals no gallop.   Pulmonary/Chest: Effort normal and breath sounds normal. No respiratory distress. She has no wheezes.  Abdominal: Soft. Bowel sounds are normal. She exhibits no distension, no abdominal bruit and no mass. There is no tenderness.  Musculoskeletal: She exhibits no edema and no tenderness.  Lymphadenopathy:    She has no cervical adenopathy.  Neurological: She is alert. She has normal reflexes. No cranial nerve deficit. She exhibits normal muscle tone. Coordination normal.  Skin: Skin is warm and dry. No rash noted. No erythema. No pallor.  Psychiatric: She has a normal mood and affect.          Assessment & Plan:

## 2012-01-21 NOTE — Patient Instructions (Addendum)
Labs are good but sugar is a little high  Otherwise your labs are stable  Start watching sugar in diet -- avoid sugar drinks and sweets as much as you can  Eat lean meats and vegetables  Stay active  The next time you come we will check a diabetes test called A1C Follow up in 6 months for annual exam with labs prior

## 2012-01-21 NOTE — Assessment & Plan Note (Signed)
Glucose is slt up  Pt has also lost wt  Disc low glycemic diet in detail Pt also stays active At next f/u will check a1c also

## 2012-01-21 NOTE — Assessment & Plan Note (Signed)
Level ok on current supplementation

## 2012-01-21 NOTE — Assessment & Plan Note (Signed)
Lipids are overall stable Disc goals for lipids and reasons to control them Rev labs with pt Rev low sat fat diet in detail F/u 6 mo

## 2012-01-21 NOTE — Assessment & Plan Note (Signed)
bp better on 2nd check- is stable bp in fair control at this time  No changes needed  Disc lifstyle change with low sodium diet and exercise   Rev labs

## 2012-02-07 DIAGNOSIS — M1710 Unilateral primary osteoarthritis, unspecified knee: Secondary | ICD-10-CM | POA: Diagnosis not present

## 2012-02-09 DIAGNOSIS — H3530 Unspecified macular degeneration: Secondary | ICD-10-CM | POA: Diagnosis not present

## 2012-02-09 DIAGNOSIS — H31009 Unspecified chorioretinal scars, unspecified eye: Secondary | ICD-10-CM | POA: Diagnosis not present

## 2012-02-09 DIAGNOSIS — Z961 Presence of intraocular lens: Secondary | ICD-10-CM | POA: Diagnosis not present

## 2012-02-09 DIAGNOSIS — H40029 Open angle with borderline findings, high risk, unspecified eye: Secondary | ICD-10-CM | POA: Diagnosis not present

## 2012-02-17 DIAGNOSIS — H26499 Other secondary cataract, unspecified eye: Secondary | ICD-10-CM | POA: Diagnosis not present

## 2012-02-18 DIAGNOSIS — I10 Essential (primary) hypertension: Secondary | ICD-10-CM | POA: Diagnosis not present

## 2012-02-18 DIAGNOSIS — I6529 Occlusion and stenosis of unspecified carotid artery: Secondary | ICD-10-CM | POA: Diagnosis not present

## 2012-02-18 DIAGNOSIS — M79609 Pain in unspecified limb: Secondary | ICD-10-CM | POA: Diagnosis not present

## 2012-02-18 DIAGNOSIS — I739 Peripheral vascular disease, unspecified: Secondary | ICD-10-CM | POA: Diagnosis not present

## 2012-02-21 DIAGNOSIS — M1710 Unilateral primary osteoarthritis, unspecified knee: Secondary | ICD-10-CM | POA: Diagnosis not present

## 2012-02-28 DIAGNOSIS — M25569 Pain in unspecified knee: Secondary | ICD-10-CM | POA: Diagnosis not present

## 2012-03-13 DIAGNOSIS — M1610 Unilateral primary osteoarthritis, unspecified hip: Secondary | ICD-10-CM | POA: Diagnosis not present

## 2012-03-13 DIAGNOSIS — M47817 Spondylosis without myelopathy or radiculopathy, lumbosacral region: Secondary | ICD-10-CM | POA: Diagnosis not present

## 2012-03-13 DIAGNOSIS — M1710 Unilateral primary osteoarthritis, unspecified knee: Secondary | ICD-10-CM | POA: Diagnosis not present

## 2012-03-27 DIAGNOSIS — M47817 Spondylosis without myelopathy or radiculopathy, lumbosacral region: Secondary | ICD-10-CM | POA: Diagnosis not present

## 2012-04-05 ENCOUNTER — Other Ambulatory Visit: Payer: Self-pay | Admitting: Family Medicine

## 2012-04-17 DIAGNOSIS — IMO0002 Reserved for concepts with insufficient information to code with codable children: Secondary | ICD-10-CM | POA: Diagnosis not present

## 2012-04-17 DIAGNOSIS — M48061 Spinal stenosis, lumbar region without neurogenic claudication: Secondary | ICD-10-CM | POA: Diagnosis not present

## 2012-04-17 DIAGNOSIS — M47817 Spondylosis without myelopathy or radiculopathy, lumbosacral region: Secondary | ICD-10-CM | POA: Diagnosis not present

## 2012-04-21 ENCOUNTER — Other Ambulatory Visit: Payer: Self-pay | Admitting: Family Medicine

## 2012-05-22 DIAGNOSIS — M47817 Spondylosis without myelopathy or radiculopathy, lumbosacral region: Secondary | ICD-10-CM | POA: Diagnosis not present

## 2012-05-22 DIAGNOSIS — M48061 Spinal stenosis, lumbar region without neurogenic claudication: Secondary | ICD-10-CM | POA: Diagnosis not present

## 2012-07-05 DIAGNOSIS — H6120 Impacted cerumen, unspecified ear: Secondary | ICD-10-CM | POA: Diagnosis not present

## 2012-07-05 DIAGNOSIS — H903 Sensorineural hearing loss, bilateral: Secondary | ICD-10-CM | POA: Diagnosis not present

## 2012-07-10 ENCOUNTER — Telehealth: Payer: Self-pay | Admitting: Family Medicine

## 2012-07-10 ENCOUNTER — Other Ambulatory Visit (INDEPENDENT_AMBULATORY_CARE_PROVIDER_SITE_OTHER): Payer: Medicare Other

## 2012-07-10 DIAGNOSIS — E785 Hyperlipidemia, unspecified: Secondary | ICD-10-CM

## 2012-07-10 DIAGNOSIS — R739 Hyperglycemia, unspecified: Secondary | ICD-10-CM

## 2012-07-10 DIAGNOSIS — D649 Anemia, unspecified: Secondary | ICD-10-CM

## 2012-07-10 DIAGNOSIS — R7309 Other abnormal glucose: Secondary | ICD-10-CM

## 2012-07-10 DIAGNOSIS — E538 Deficiency of other specified B group vitamins: Secondary | ICD-10-CM

## 2012-07-10 DIAGNOSIS — M949 Disorder of cartilage, unspecified: Secondary | ICD-10-CM

## 2012-07-10 DIAGNOSIS — Z0000 Encounter for general adult medical examination without abnormal findings: Secondary | ICD-10-CM

## 2012-07-10 DIAGNOSIS — I10 Essential (primary) hypertension: Secondary | ICD-10-CM | POA: Diagnosis not present

## 2012-07-10 DIAGNOSIS — M899 Disorder of bone, unspecified: Secondary | ICD-10-CM

## 2012-07-10 LAB — CBC WITH DIFFERENTIAL/PLATELET
Basophils Relative: 0.3 % (ref 0.0–3.0)
Eosinophils Absolute: 0 10*3/uL (ref 0.0–0.7)
HCT: 40.3 % (ref 36.0–46.0)
Hemoglobin: 13.4 g/dL (ref 12.0–15.0)
Lymphocytes Relative: 20.5 % (ref 12.0–46.0)
MCHC: 33.1 g/dL (ref 30.0–36.0)
MCV: 96.8 fl (ref 78.0–100.0)
Neutro Abs: 6.3 10*3/uL (ref 1.4–7.7)
RBC: 4.17 Mil/uL (ref 3.87–5.11)

## 2012-07-10 LAB — COMPREHENSIVE METABOLIC PANEL
AST: 21 U/L (ref 0–37)
BUN: 23 mg/dL (ref 6–23)
Calcium: 9.5 mg/dL (ref 8.4–10.5)
Chloride: 103 mEq/L (ref 96–112)
Creatinine, Ser: 0.8 mg/dL (ref 0.4–1.2)
GFR: 72.65 mL/min (ref >60.00)

## 2012-07-10 LAB — LIPID PANEL
Cholesterol: 157 mg/dL (ref 0–200)
LDL Cholesterol: 77 mg/dL (ref 0–99)
Triglycerides: 123 mg/dL (ref 0.0–149.0)

## 2012-07-10 NOTE — Telephone Encounter (Signed)
Labs for upcoming annual exam

## 2012-07-19 ENCOUNTER — Other Ambulatory Visit: Payer: Self-pay | Admitting: Family Medicine

## 2012-07-19 NOTE — Telephone Encounter (Signed)
Will refill electronically  

## 2012-07-19 NOTE — Telephone Encounter (Signed)
Ok to refill 

## 2012-07-25 ENCOUNTER — Encounter: Payer: Self-pay | Admitting: Family Medicine

## 2012-07-25 ENCOUNTER — Ambulatory Visit (INDEPENDENT_AMBULATORY_CARE_PROVIDER_SITE_OTHER): Payer: Medicare Other | Admitting: Family Medicine

## 2012-07-25 VITALS — BP 132/70 | HR 88 | Temp 98.3°F | Ht 65.0 in | Wt 122.8 lb

## 2012-07-25 DIAGNOSIS — M899 Disorder of bone, unspecified: Secondary | ICD-10-CM | POA: Diagnosis not present

## 2012-07-25 DIAGNOSIS — E785 Hyperlipidemia, unspecified: Secondary | ICD-10-CM

## 2012-07-25 DIAGNOSIS — R739 Hyperglycemia, unspecified: Secondary | ICD-10-CM

## 2012-07-25 DIAGNOSIS — R7989 Other specified abnormal findings of blood chemistry: Secondary | ICD-10-CM | POA: Insufficient documentation

## 2012-07-25 DIAGNOSIS — R7309 Other abnormal glucose: Secondary | ICD-10-CM

## 2012-07-25 DIAGNOSIS — I10 Essential (primary) hypertension: Secondary | ICD-10-CM

## 2012-07-25 DIAGNOSIS — R6889 Other general symptoms and signs: Secondary | ICD-10-CM

## 2012-07-25 DIAGNOSIS — M949 Disorder of cartilage, unspecified: Secondary | ICD-10-CM | POA: Diagnosis not present

## 2012-07-25 DIAGNOSIS — Z0000 Encounter for general adult medical examination without abnormal findings: Secondary | ICD-10-CM

## 2012-07-25 NOTE — Assessment & Plan Note (Signed)
Disc goals for lipids and reasons to control them Rev labs with pt Rev low sat fat diet in detail  On mevacor-no problems

## 2012-07-25 NOTE — Progress Notes (Signed)
Subjective:    Patient ID: [name], female    DOB: 08/03/1920, 79 y.o.   MRN: [id number]  HPI I have personally reviewed the Medicare Annual Wellness questionnaire and have noted 1. The patient's medical and social history 2. Their use of alcohol, tobacco or illicit drugs 3. Their current medications and supplements 4. The patient's functional ability including ADL's, fall risks, home safety risks and hearing or visual             impairment. 5. Diet and physical activities 6. Evidence for depression or mood disorders  The patients weight, height, BMI have been recorded in the chart and visual acuity is per eye clinic.  I have made referrals, counseling and provided education to the patient based review of the above and I have provided the pt with a written personalized care plan for preventive services.  See scanned forms.  Routine anticipatory guidance given to patient.  See health maintenance. Flu vaccine 10/13 Shingles vaccine 4/11 PNA 1/96  Tetanus vaccine 4/10 Colon - ifob nl 4/10  Breast cancer screening mammogram 05- does not want to do these any more  Self exam- no breast lumps at all / no changes  Advance directive--she has a living will and her sister [name] is her power of attorney Cognitive function addressed- see scanned forms- and if abnormal then additional documentation follows She does very well/ is pretty sharp and folks around her tell her she is doing great .   Falls -none   Mood- has been good/ not depressed and stays motivated   Osteopenia  dexa 2010- she is open to another dexa if ins will pay  She would be open to trying another med if needed  Has taken fosamax in past - went off for legs tingling side effect  She had one fracture in 2004 - fell off a curb-no problems since  Takes ca and D  bp is stable today  No cp or palpitations or headaches or edema  No side effects to medicines  BP Readings from Last 3 Encounters:  07/25/12  132/70  01/21/12 138/70  10/15/11 118/60     Lipids Lab Results  Component Value Date   CHOL 157 07/10/2012   CHOL 133 01/14/2012   CHOL 152 07/07/2011   Lab Results  Component Value Date   HDL 55.30 07/10/2012   HDL 78.29 01/14/2012   HDL 55.70 07/07/2011   Lab Results  Component Value Date   LDLCALC 77 07/10/2012   LDLCALC 63 01/14/2012   LDLCALC 73 07/07/2011   Lab Results  Component Value Date   TRIG 123.0 07/10/2012   TRIG 98.0 01/14/2012   TRIG 119.0 07/07/2011   Lab Results  Component Value Date   CHOLHDL 3 07/10/2012   CHOLHDL 3 01/14/2012   CHOLHDL 3 07/07/2011   Lab Results  Component Value Date   LDLDIRECT 70.8 09/05/2009   LDLDIRECT 61.2 03/05/2009     Pre diabetes - she stays away from sweets now - but A1c came down from 6.7 to 6.2  Is happy about that    tsh slt elevated  No hx of thyroid problems  No goiter  Overall no change in her energy level   PMH and SH reviewed  Meds, vitals, and allergies reviewed.   Patient Active Problem List   Diagnosis Date Noted  . Abnormal TSH 07/25/2012  . Encounter for Medicare annual wellness exam 07/10/2012  . Hyperglycemia 01/21/2012  . Leg pain, bilateral 10/15/2011  .  Ankle edema 10/15/2011  . Syncope 11/20/2010  . Rash and nonspecific skin eruption 08/07/2010  . VITAMIN B12 DEFICIENCY 08/22/2008  . UNSPECIFIED ANEMIA 08/19/2008  . OSTEOPENIA 07/09/2008  . CAROTID ARTERY STENOSIS 11/14/2006  . HYPERLIPIDEMIA 06/09/2006  . HYPERTENSION 06/09/2006  . CONSTIPATION, CHRONIC 06/09/2006  . OSTEOARTHRITIS 06/09/2006   Past Medical History  Diagnosis Date  . HLD (hyperlipidemia) 2002  . HTN (hypertension)   . Osteoarthritis   . Osteopenia     with arm fracture  . Carotid stenosis   . Chronic constipation    Past Surgical History  Procedure Laterality Date  . Colonoscopy  8/02    Diverticulosis  . Dexa  08/2002    Osteopenia  . Total hip arthroplasty  6/06  . Total hip arthroplasty  10/06  .  Carotid doppler  11/07    50-79 % RICA  . Carotid doppler      50-69 % RICA, lessa then 50% LICA-overall no change  . Abd [place]  12/07    Negative AAA  . Dexa  4/10    Osteopenia (worse at forearm T-1.98)-imp at LS -1.0   History  Substance Use Topics  . Smoking status: Never Smoker   . Smokeless tobacco: Not on file  . Alcohol Use: No   Family History  Problem Relation Age of Onset  . Heart attack Father   . Mitral valve prolapse Sister    Allergies  Allergen Reactions  . Alendronate Sodium     Leg tingling   . Hctz (Hydrochlorothiazide)     Dehydration/ syncope  . Hydrocodone     Syncope    Current Outpatient Prescriptions on File Prior to Visit  Medication Sig Dispense Refill  . aspirin 81 MG tablet Take 81 mg by mouth daily.        Marland Kitchen BENICAR 40 MG tablet TAKE ONE (1) TABLET BY MOUTH EVERY      DAY  30 each  5  . CALCIUM-VITAMIN D PO Take 1 tablet by mouth 2 (two) times daily with a meal.        . diphenhydrAMINE (BENADRYL) 25 mg capsule Take 25 mg by mouth daily as needed.        . docusate sodium (COLACE) 50 MG capsule Take 50-100 mg by mouth 2 (two) times daily.        Marland Kitchen lovastatin (MEVACOR) 20 MG tablet TAKE ONE (1) TABLET BY MOUTH EVERY DAY  30 tablet  5  . Omega-3 Fatty Acids (FISH OIL PO) Take 1 capsule by mouth 2 (two) times daily.        . traMADol (ULTRAM) 50 MG tablet TAKE 1 TABLET BY MOUTH UP TO 3 TIMES DAILY AS NEEDED FOR PAIN  90 tablet  5  . vitamin E 400 UNIT capsule Take 400 Units by mouth daily.        . Vitamins-Lipotropics (B-100 COMPLEX) TABS Take 1 tablet by mouth 2 (two) times daily.         No current facility-administered medications on file prior to visit.    Review of Systems Review of Systems  Constitutional: Negative for fever, appetite change,  and unexpected weight change. pos for fatigue (more easily than in the past - takes longer to do things) Eyes: Negative for pain and visual disturbance.  Respiratory: Negative for cough and  shortness of breath.   Cardiovascular: Negative for cp or palpitations    Gastrointestinal: Negative for nausea, diarrhea and constipation.  Genitourinary: Negative for urgency and  frequency.  Skin: Negative for pallor or rash  MSK pos for chronic back pain and arthritis aches and pains  Neurological: Negative for weakness, light-headedness, numbness and headaches.  Hematological: Negative for adenopathy. Does not bruise/bleed easily.  Psychiatric/Behavioral: Negative for dysphoric mood. The patient is not nervous/anxious.         Objective:   Physical Exam  Constitutional: She appears well-developed and well-nourished. No distress.  HENT:  Head: Normocephalic and atraumatic.  Right Ear: External ear normal.  Left Ear: External ear normal.  Nose: Nose normal.  Mouth/Throat: Oropharynx is clear and moist.  Nares are boggy but clear   Eyes: Conjunctivae and EOM are normal. Right eye exhibits no discharge. Left eye exhibits no discharge. No scleral icterus.  Neck: Normal range of motion. Neck supple. No JVD present. Carotid bruit is not present. No thyromegaly present.  Cardiovascular: Normal rate, regular rhythm, normal heart sounds and intact distal pulses.  Exam reveals no gallop.   Pulmonary/Chest: Effort normal and breath sounds normal. No respiratory distress. She has no wheezes. She exhibits no tenderness.  Abdominal: Soft. Bowel sounds are normal. She exhibits no distension, no abdominal bruit and no mass. There is no tenderness.  Genitourinary: No breast swelling, tenderness, discharge or bleeding.  Breast exam: No mass, nodules, thickening, tenderness, bulging, retraction, inflamation, nipple discharge or skin changes noted.  No axillary or clavicular LA.  Chaperoned exam.    Musculoskeletal: Normal range of motion. She exhibits no edema and no tenderness.  Lymphadenopathy:    She has no cervical adenopathy.  Neurological: She is alert. She has normal reflexes. No cranial nerve  deficit. She exhibits normal muscle tone. Coordination normal.  No tremor   Skin: Skin is warm and dry. No rash noted. No erythema. No pallor.  Psychiatric: She has a normal mood and affect.          Assessment & Plan:

## 2012-07-25 NOTE — Assessment & Plan Note (Signed)
a1c down from 6.7 to 6.1 with better diet  Disc imp of healthy diet and active lifestyle  F/u 6 mo

## 2012-07-25 NOTE — Assessment & Plan Note (Signed)
With hx of a wrist fx in the past  Was on fosamax-stopped due to side effects  No falls recently Takes ca and D Overdue for 2 y dexa- pt wants to move forward with that  She would be open to other medicine for bone density if needed  Has kyphosis

## 2012-07-25 NOTE — Assessment & Plan Note (Signed)
Reviewed health habits including diet and exercise and skin cancer prevention Also reviewed health mt list, fam hx and immunizations   See HPI Suprisingly independent for her age with sharp cognitive skills and memory  She declines colon screen and mammograms at this time

## 2012-07-25 NOTE — Assessment & Plan Note (Signed)
bp is stable today  No cp or palpitations or headaches or edema  No side effects to medicines  BP Readings from Last 3 Encounters:  07/25/12 132/70  01/21/12 138/70  10/15/11 118/60

## 2012-07-25 NOTE — Patient Instructions (Signed)
See Shirlee Limerick at check out to schedule a bone density test  Schedule non fasting labs for 6 months (thyroid and sugar test) and then follow up  Take care of yourself  Stay active- I'm glad you are doing so well

## 2012-07-25 NOTE — Assessment & Plan Note (Signed)
Mildly high Lab Results  Component Value Date   TSH 5.74* 07/10/2012   No symptoms  Re check 55mo - will follow

## 2012-08-07 ENCOUNTER — Ambulatory Visit: Payer: Self-pay | Admitting: Family Medicine

## 2012-08-07 ENCOUNTER — Encounter: Payer: Self-pay | Admitting: Family Medicine

## 2012-08-07 DIAGNOSIS — M810 Age-related osteoporosis without current pathological fracture: Secondary | ICD-10-CM | POA: Diagnosis not present

## 2012-08-07 LAB — HM DEXA SCAN

## 2012-08-08 ENCOUNTER — Encounter: Payer: Self-pay | Admitting: Family Medicine

## 2012-08-08 ENCOUNTER — Encounter: Payer: Self-pay | Admitting: *Deleted

## 2012-08-10 DIAGNOSIS — Z961 Presence of intraocular lens: Secondary | ICD-10-CM | POA: Diagnosis not present

## 2012-08-10 DIAGNOSIS — H31009 Unspecified chorioretinal scars, unspecified eye: Secondary | ICD-10-CM | POA: Diagnosis not present

## 2012-08-10 DIAGNOSIS — H40029 Open angle with borderline findings, high risk, unspecified eye: Secondary | ICD-10-CM | POA: Diagnosis not present

## 2012-08-10 DIAGNOSIS — H26499 Other secondary cataract, unspecified eye: Secondary | ICD-10-CM | POA: Diagnosis not present

## 2012-08-14 DIAGNOSIS — M47817 Spondylosis without myelopathy or radiculopathy, lumbosacral region: Secondary | ICD-10-CM | POA: Diagnosis not present

## 2012-08-28 DIAGNOSIS — M47817 Spondylosis without myelopathy or radiculopathy, lumbosacral region: Secondary | ICD-10-CM | POA: Diagnosis not present

## 2012-08-28 DIAGNOSIS — M4120 Other idiopathic scoliosis, site unspecified: Secondary | ICD-10-CM | POA: Diagnosis not present

## 2012-08-28 DIAGNOSIS — M545 Low back pain: Secondary | ICD-10-CM | POA: Diagnosis not present

## 2012-08-31 DIAGNOSIS — E785 Hyperlipidemia, unspecified: Secondary | ICD-10-CM | POA: Diagnosis not present

## 2012-08-31 DIAGNOSIS — I70219 Atherosclerosis of native arteries of extremities with intermittent claudication, unspecified extremity: Secondary | ICD-10-CM | POA: Diagnosis not present

## 2012-08-31 DIAGNOSIS — I739 Peripheral vascular disease, unspecified: Secondary | ICD-10-CM | POA: Diagnosis not present

## 2012-08-31 DIAGNOSIS — I6529 Occlusion and stenosis of unspecified carotid artery: Secondary | ICD-10-CM | POA: Diagnosis not present

## 2012-09-12 ENCOUNTER — Telehealth: Payer: Self-pay | Admitting: Family Medicine

## 2012-09-12 NOTE — Telephone Encounter (Signed)
[name] notified letter ready for pick-up

## 2012-09-12 NOTE — Telephone Encounter (Signed)
Dr. Milinda Antis, [place] and Ms. Yicel Shannon came into the office today requesting a [place] letter for the post office stating that [name] is unable to walk to the mailbox and get mail because she has fallen several times. [place] called the post office and they said if she gets a letter from her PCP stating her age and that she lives alone and unable to walk to get her mail, the post office will have her mailbox moved to her front door. Please let me know if you can do this for her. She will need 3 copies and can pick up when ready.  Thanks, Revonda Standard

## 2012-09-12 NOTE — Telephone Encounter (Signed)
Letter is in the IN box 

## 2012-09-19 ENCOUNTER — Other Ambulatory Visit: Payer: Self-pay | Admitting: Family Medicine

## 2012-12-11 DIAGNOSIS — Z23 Encounter for immunization: Secondary | ICD-10-CM | POA: Diagnosis not present

## 2012-12-28 ENCOUNTER — Other Ambulatory Visit: Payer: Self-pay | Admitting: Family Medicine

## 2012-12-28 NOTE — Telephone Encounter (Signed)
Px written for call in   

## 2012-12-28 NOTE — Telephone Encounter (Signed)
Phoned in to pharmacy. 

## 2013-01-16 ENCOUNTER — Encounter: Payer: Self-pay | Admitting: Radiology

## 2013-01-17 ENCOUNTER — Ambulatory Visit (INDEPENDENT_AMBULATORY_CARE_PROVIDER_SITE_OTHER): Payer: Medicare Other | Admitting: Family Medicine

## 2013-01-17 ENCOUNTER — Encounter: Payer: Self-pay | Admitting: Family Medicine

## 2013-01-17 VITALS — BP 158/76 | HR 72 | Temp 98.4°F | Ht 65.0 in | Wt 122.5 lb

## 2013-01-17 DIAGNOSIS — L84 Corns and callosities: Secondary | ICD-10-CM

## 2013-01-17 MED ORDER — CEPHALEXIN 500 MG PO CAPS: 500.0000 mg | ORAL_CAPSULE | Freq: Two times a day (BID) | ORAL | Status: DC

## 2013-01-17 NOTE — Progress Notes (Signed)
Subjective:    Patient ID: [name], female    DOB: December 28, 1920, 79 y.o.   MRN: [id number]  HPI Here for toe problem It was a little swollen - and red - was sore yesterday and not at all today  3rd toe L Is better today  She soaked feet yesterday and it helped  Does not hurt today No numbness or neuropathy   Patient Active Problem List   Diagnosis Date Noted  . Abnormal TSH 07/25/2012  . Encounter for Medicare annual wellness exam 07/10/2012  . Hyperglycemia 01/21/2012  . Leg pain, bilateral 10/15/2011  . Ankle edema 10/15/2011  . Rash and nonspecific skin eruption 08/07/2010  . VITAMIN B12 DEFICIENCY 08/22/2008  . UNSPECIFIED ANEMIA 08/19/2008  . OSTEOPENIA 07/09/2008  . CAROTID ARTERY STENOSIS 11/14/2006  . HYPERLIPIDEMIA 06/09/2006  . HYPERTENSION 06/09/2006  . CONSTIPATION, CHRONIC 06/09/2006  . OSTEOARTHRITIS 06/09/2006   Past Medical History  Diagnosis Date  . HLD (hyperlipidemia) 2002  . HTN (hypertension)   . Osteoarthritis   . Osteopenia     with arm fracture  . Carotid stenosis   . Chronic constipation    Past Surgical History  Procedure Laterality Date  . Colonoscopy  8/02    Diverticulosis  . Dexa  08/2002    Osteopenia  . Total hip arthroplasty  6/06  . Total hip arthroplasty  10/06  . Carotid doppler  11/07    50-79 % RICA  . Carotid doppler      50-69 % RICA, lessa then 50% LICA-overall no change  . Abd [place]  12/07    Negative AAA  . Dexa  4/10    Osteopenia (worse at forearm T-1.98)-imp at LS -1.0   History  Substance Use Topics  . Smoking status: Never Smoker   . Smokeless tobacco: Not on file  . Alcohol Use: No   Family History  Problem Relation Age of Onset  . Heart attack Father   . Mitral valve prolapse Sister    Allergies  Allergen Reactions  . Alendronate Sodium     Leg tingling   . Hctz [Hydrochlorothiazide]     Dehydration/ syncope  . Hydrocodone     Syncope    Current Outpatient Prescriptions on File Prior to  Visit  Medication Sig Dispense Refill  . aspirin 81 MG tablet Take 81 mg by mouth daily.        Marland Kitchen BENICAR 40 MG tablet TAKE ONE (1) TABLET BY MOUTH EVERY DAY  30 tablet  5  . beta carotene w/minerals (OCUVITE) tablet Take 1 tablet by mouth daily.      Marland Kitchen CALCIUM-VITAMIN D PO Take 1 tablet by mouth 2 (two) times daily with a meal.        . diphenhydrAMINE (BENADRYL) 25 mg capsule Take 25 mg by mouth daily as needed.        . docusate sodium (COLACE) 50 MG capsule Take 50-100 mg by mouth as needed.       Marland Kitchen ibuprofen (ADVIL,MOTRIN) 200 MG tablet Take 200 mg by mouth as needed for pain.      Marland Kitchen lovastatin (MEVACOR) 20 MG tablet TAKE ONE (1) TABLET BY MOUTH EVERY DAY  30 tablet  5  . Omega-3 Fatty Acids (FISH OIL PO) Take 1 capsule by mouth 2 (two) times daily.        . traMADol (ULTRAM) 50 MG tablet TAKE 1 TABLET BY MOUTH UP TO 3 TIMES DAILY AS NEEDED FOR PAIN  90 tablet  5  . vitamin C (ASCORBIC ACID) 500 MG tablet Take 500 mg by mouth daily.      . vitamin E 400 UNIT capsule Take 400 Units by mouth daily.        . Vitamins-Lipotropics (B-100 COMPLEX) TABS Take 1 tablet by mouth 2 (two) times daily.         No current facility-administered medications on file prior to visit.     Review of Systems Review of Systems  Constitutional: Negative for fever, appetite change, fatigue and unexpected weight change.  Eyes: Negative for pain and visual disturbance. ENT pos for hearing loss   Respiratory: Negative for cough and shortness of breath.   Cardiovascular: Negative for cp or palpitations    Gastrointestinal: Negative for nausea, diarrhea and constipation.  Genitourinary: Negative for urgency and frequency.  Skin: Negative for pallor or rash  pos for redness of toe that is better today Neurological: Negative for weakness, light-headedness, numbness and headaches.  Hematological: Negative for adenopathy. Does not bruise/bleed easily.  Psychiatric/Behavioral: Negative for dysphoric mood. The  patient is not nervous/anxious.         Objective:   Physical Exam  Constitutional: She appears well-developed and well-nourished. No distress.  HENT:  Head: Normocephalic and atraumatic.  Neck: Normal range of motion. Neck supple.  Cardiovascular: Normal rate and regular rhythm.   Musculoskeletal: She exhibits no edema.  Lymphadenopathy:    She has no cervical adenopathy.  Neurological: She is alert. She has normal reflexes.  Skin: Skin is warm and dry. There is erythema.  Callus on tip of 3rd toe- with scab and pustule  Pus expressed from pustule and sent for culture  Scant redness  Nl rom and gait    Psychiatric: She has a normal mood and affect.          Assessment & Plan:

## 2013-01-17 NOTE — Patient Instructions (Signed)
Start the keflex (antibiotic for your toe) and take it twice daily for 7 days - I think the callus on your toe is mildly infected  Keep the toe very clean  You can use an antibiotic ointment like neosporin If redness or swelling or pain worsen- let me know  If it does not improve- let me know I took a sample from your toe (fluid) for culture and we will call you with a result

## 2013-01-18 DIAGNOSIS — Z79899 Other long term (current) drug therapy: Secondary | ICD-10-CM | POA: Diagnosis not present

## 2013-01-18 NOTE — Assessment & Plan Note (Signed)
Appears to be infected- pustule expressed/drained and cx obt Sterile cleaning and dressing applied Disc aftercare  Keflex 500 bid for 7d Pend cx inst to call if worse- inc in redness/ pain/ swelling or any fever

## 2013-01-20 LAB — WOUND CULTURE
Gram Stain: NONE SEEN
Gram Stain: NONE SEEN

## 2013-02-06 ENCOUNTER — Encounter: Payer: Self-pay | Admitting: Family Medicine

## 2013-03-07 DIAGNOSIS — I70219 Atherosclerosis of native arteries of extremities with intermittent claudication, unspecified extremity: Secondary | ICD-10-CM | POA: Diagnosis not present

## 2013-03-07 DIAGNOSIS — I6529 Occlusion and stenosis of unspecified carotid artery: Secondary | ICD-10-CM | POA: Diagnosis not present

## 2013-03-07 DIAGNOSIS — E785 Hyperlipidemia, unspecified: Secondary | ICD-10-CM | POA: Diagnosis not present

## 2013-04-03 ENCOUNTER — Ambulatory Visit (INDEPENDENT_AMBULATORY_CARE_PROVIDER_SITE_OTHER): Payer: Medicare Other | Admitting: Family Medicine

## 2013-04-03 ENCOUNTER — Encounter: Payer: Self-pay | Admitting: Family Medicine

## 2013-04-03 VITALS — BP 136/74 | HR 84 | Temp 98.6°F | Ht 65.0 in | Wt 122.2 lb

## 2013-04-03 DIAGNOSIS — I10 Essential (primary) hypertension: Secondary | ICD-10-CM | POA: Diagnosis not present

## 2013-04-03 DIAGNOSIS — E785 Hyperlipidemia, unspecified: Secondary | ICD-10-CM

## 2013-04-03 MED ORDER — OLMESARTAN MEDOXOMIL 40 MG PO TABS: ORAL_TABLET | ORAL | Status: DC

## 2013-04-03 MED ORDER — LOVASTATIN 20 MG PO TABS: ORAL_TABLET | ORAL | Status: DC

## 2013-04-03 NOTE — Progress Notes (Signed)
Subjective:    Patient ID: [name], female    DOB: 04/17/1920, 80 y.o.   MRN: [id number]  HPI Here for f/u of HTN   Was worried her bp was higher- but it is ok today  BP: 136/74 mmHg   She went for her 6 mo appt for carotid f/u- was high  Then the [place] fire dept checked - 150s systolic    (? 160 at one time)       Chemistry      Component Value Date/Time   NA 138 07/10/2012 1055   K 4.1 07/10/2012 1055   CL 103 07/10/2012 1055   CO2 25 07/10/2012 1055   BUN 23 07/10/2012 1055   CREATININE 0.8 07/10/2012 1055      Component Value Date/Time   CALCIUM 9.5 07/10/2012 1055   ALKPHOS 53 07/10/2012 1055   AST 21 07/10/2012 1055   ALT 16 07/10/2012 1055   BILITOT 0.6 07/10/2012 1055       Patient Active Problem List   Diagnosis Date Noted  . Callus of foot 01/17/2013  . Abnormal TSH 07/25/2012  . Encounter for Medicare annual wellness exam 07/10/2012  . Hyperglycemia 01/21/2012  . Leg pain, bilateral 10/15/2011  . Ankle edema 10/15/2011  . Rash and nonspecific skin eruption 08/07/2010  . VITAMIN B12 DEFICIENCY 08/22/2008  . UNSPECIFIED ANEMIA 08/19/2008  . OSTEOPENIA 07/09/2008  . CAROTID ARTERY STENOSIS 11/14/2006  . HYPERLIPIDEMIA 06/09/2006  . HYPERTENSION 06/09/2006  . CONSTIPATION, CHRONIC 06/09/2006  . OSTEOARTHRITIS 06/09/2006   Past Medical History  Diagnosis Date  . HLD (hyperlipidemia) 2002  . HTN (hypertension)   . Osteoarthritis   . Osteopenia     with arm fracture  . Carotid stenosis   . Chronic constipation    Past Surgical History  Procedure Laterality Date  . Colonoscopy  8/02    Diverticulosis  . Dexa  08/2002    Osteopenia  . Total hip arthroplasty  6/06  . Total hip arthroplasty  10/06  . Carotid doppler  11/07    50-79 % RICA  . Carotid doppler      50-69 % RICA, lessa then 50% LICA-overall no change  . Abd [place]  12/07    Negative AAA  . Dexa  4/10    Osteopenia (worse at forearm T-1.98)-imp at LS -1.0   History  Substance  Use Topics  . Smoking status: Never Smoker   . Smokeless tobacco: Not on file  . Alcohol Use: No   Family History  Problem Relation Age of Onset  . Heart attack Father   . Mitral valve prolapse Sister    Allergies  Allergen Reactions  . Alendronate Sodium     Leg tingling   . Hctz [Hydrochlorothiazide]     Dehydration/ syncope  . Hydrocodone     Syncope    Current Outpatient Prescriptions on File Prior to Visit  Medication Sig Dispense Refill  . aspirin 81 MG tablet Take 81 mg by mouth daily.        Marland Kitchen. BENICAR 40 MG tablet TAKE ONE (1) TABLET BY MOUTH EVERY DAY  30 tablet  5  . beta carotene w/minerals (OCUVITE) tablet Take 1 tablet by mouth daily.      Marland Kitchen. CALCIUM-VITAMIN D PO Take 1 tablet by mouth 2 (two) times daily with a meal.        . diphenhydrAMINE (BENADRYL) 25 mg capsule Take 25 mg by mouth daily as needed.        .Marland Kitchen  docusate sodium (COLACE) 50 MG capsule Take 50-100 mg by mouth as needed.       Marland Kitchen ibuprofen (ADVIL,MOTRIN) 200 MG tablet Take 200 mg by mouth as needed for pain.      Marland Kitchen lovastatin (MEVACOR) 20 MG tablet TAKE ONE (1) TABLET BY MOUTH EVERY DAY  30 tablet  5  . Omega-3 Fatty Acids (FISH OIL PO) Take 1 capsule by mouth 2 (two) times daily.        . traMADol (ULTRAM) 50 MG tablet TAKE 1 TABLET BY MOUTH UP TO 3 TIMES DAILY AS NEEDED FOR PAIN  90 tablet  5  . vitamin C (ASCORBIC ACID) 500 MG tablet Take 500 mg by mouth daily.      . vitamin E 400 UNIT capsule Take 400 Units by mouth daily.        . Vitamins-Lipotropics (B-100 COMPLEX) TABS Take 1 tablet by mouth 2 (two) times daily.         No current facility-administered medications on file prior to visit.     Review of Systems Review of Systems  Constitutional: Negative for fever, appetite change, fatigue and unexpected weight change.  Eyes: Negative for pain and visual disturbance.  Respiratory: Negative for cough and shortness of breath.   Cardiovascular: Negative for cp or palpitations      Gastrointestinal: Negative for nausea, diarrhea and constipation.  Genitourinary: Negative for urgency and frequency.  Skin: Negative for pallor or rash   Neurological: Negative for weakness, light-headedness, numbness and headaches.  Hematological: Negative for adenopathy. Does not bruise/bleed easily.  Psychiatric/Behavioral: Negative for dysphoric mood. The patient is not nervous/anxious.         Objective:   Physical Exam  Constitutional: She appears well-developed and well-nourished. No distress.  HENT:  Head: Normocephalic and atraumatic.  Mouth/Throat: Oropharynx is clear and moist.  Quite HOH  Eyes: Conjunctivae and EOM are normal. Pupils are equal, round, and reactive to light. No scleral icterus.  Neck: Normal range of motion. Neck supple. No JVD present. Carotid bruit is not present. No thyromegaly present.  Cardiovascular: Normal rate, regular rhythm, normal heart sounds and intact distal pulses.  Exam reveals no gallop.   Pulmonary/Chest: Effort normal and breath sounds normal. No respiratory distress. She has no wheezes. She exhibits no tenderness.  Abdominal: Soft. Bowel sounds are normal. She exhibits no distension, no abdominal bruit and no mass. There is no tenderness.  Musculoskeletal: Normal range of motion. She exhibits no edema and no tenderness.  Lymphadenopathy:    She has no cervical adenopathy.  Neurological: She is alert. She has normal reflexes. No cranial nerve deficit. She exhibits normal muscle tone. Coordination normal.  Skin: Skin is warm and dry. No rash noted. No erythema. No pallor.  Psychiatric: She has a normal mood and affect.          Assessment & Plan:

## 2013-04-03 NOTE — Patient Instructions (Signed)
Your blood pressure is much better today  We will watch this - I do not want to add more medicine at this time- but we may need to do that in the future Please follow up with me in April with labs prior  Take care of yourself  Watch sodium in your diet for blood pressure     DASH Diet The DASH diet stands for "Dietary Approaches to Stop Hypertension." It is a healthy eating plan that has been shown to reduce high blood pressure (hypertension) in as little as 14 days, while also possibly providing other significant health benefits. These other health benefits include reducing the risk of breast cancer after menopause and reducing the risk of type 2 diabetes, heart disease, colon cancer, and stroke. Health benefits also include weight loss and slowing kidney failure in patients with chronic kidney disease.  DIET GUIDELINES  Limit salt (sodium). Your diet should contain less than 1500 mg of sodium daily.  Limit refined or processed carbohydrates. Your diet should include mostly whole grains. Desserts and added sugars should be used sparingly.  Include small amounts of heart-healthy fats. These types of fats include nuts, oils, and tub margarine. Limit saturated and trans fats. These fats have been shown to be harmful in the body. CHOOSING FOODS  The following food groups are based on a 2000 calorie diet. See your Registered Dietitian for individual calorie needs. Grains and Grain Products (6 to 8 servings daily)  Eat More Often: Whole-wheat bread, brown rice, whole-grain or wheat pasta, quinoa, popcorn without added fat or salt (air popped).  Eat Less Often: White bread, white pasta, white rice, cornbread. Vegetables (4 to 5 servings daily)  Eat More Often: Fresh, frozen, and canned vegetables. Vegetables may be raw, steamed, roasted, or grilled with a minimal amount of fat.  Eat Less Often/Avoid: Creamed or fried vegetables. Vegetables in a cheese sauce. Fruit (4 to 5 servings  daily)  Eat More Often: All fresh, canned (in natural juice), or frozen fruits. Dried fruits without added sugar. One hundred percent fruit juice ( cup [237 mL] daily).  Eat Less Often: Dried fruits with added sugar. Canned fruit in light or heavy syrup. Foot LockerLean Meats, Fish, and Poultry (2 servings or less daily. One serving is 3 to 4 oz [85-114 g]).  Eat More Often: Ninety percent or leaner ground beef, tenderloin, sirloin. Round cuts of beef, chicken breast, [place]key breast. All fish. Grill, bake, or broil your meat. Nothing should be fried.  Eat Less Often/Avoid: Fatty cuts of meat, [place], or chicken leg, thigh, or wing. Fried cuts of meat or fish. Dairy (2 to 3 servings)  Eat More Often: Low-fat or fat-free milk, low-fat plain or light yogurt, reduced-fat or part-skim cheese.  Eat Less Often/Avoid: Milk (whole, 2%).Whole milk yogurt. Full-fat cheeses. Nuts, Seeds, and Legumes (4 to 5 servings per week)  Eat More Often: All without added salt.  Eat Less Often/Avoid: Salted nuts and seeds, canned beans with added salt. Fats and Sweets (limited)  Eat More Often: Vegetable oils, tub margarines without trans fats, sugar-free gelatin. Mayonnaise and salad dressings.  Eat Less Often/Avoid: Coconut oils, palm oils, butter, stick margarine, cream, half and half, cookies, candy, pie. FOR MORE INFORMATION The Dash Diet Eating Plan: www.dashdiet.org Document Released: 03/04/2011 Document Revised: 06/07/2011 Document Reviewed: 03/04/2011 [facility] Patient Information 2014 [place], [place].

## 2013-04-03 NOTE — Progress Notes (Signed)
Pre-visit discussion using our clinic review tool. No additional management support is needed unless otherwise documented below in the visit note.  

## 2013-04-04 NOTE — Assessment & Plan Note (Signed)
Stable bp today on benicar alone  Higher outside office  Hx of hypotension/ syncope on hctz in past  Apprehensive to add agent due to this Disc dash diet  Pt may get home cuff No changes F/u planned

## 2013-04-30 ENCOUNTER — Telehealth: Payer: Self-pay | Admitting: Family Medicine

## 2013-04-30 NOTE — Telephone Encounter (Signed)
Relevant patient education mailed to patient.  

## 2013-06-27 ENCOUNTER — Other Ambulatory Visit (INDEPENDENT_AMBULATORY_CARE_PROVIDER_SITE_OTHER): Payer: Medicare Other

## 2013-06-27 DIAGNOSIS — R7309 Other abnormal glucose: Secondary | ICD-10-CM | POA: Diagnosis not present

## 2013-06-27 DIAGNOSIS — R946 Abnormal results of thyroid function studies: Secondary | ICD-10-CM

## 2013-06-27 DIAGNOSIS — R739 Hyperglycemia, unspecified: Secondary | ICD-10-CM

## 2013-06-27 DIAGNOSIS — R7989 Other specified abnormal findings of blood chemistry: Secondary | ICD-10-CM

## 2013-06-27 LAB — TSH: TSH: 5.49 u[IU]/mL (ref 0.35–5.50)

## 2013-06-27 LAB — HEMOGLOBIN A1C: Hgb A1c MFr Bld: 6.1 % (ref 4.6–6.5)

## 2013-07-04 ENCOUNTER — Ambulatory Visit: Payer: Medicare Other | Admitting: Family Medicine

## 2013-07-10 ENCOUNTER — Ambulatory Visit: Payer: Medicare Other | Admitting: Family Medicine

## 2013-07-11 ENCOUNTER — Ambulatory Visit (INDEPENDENT_AMBULATORY_CARE_PROVIDER_SITE_OTHER): Payer: Medicare Other | Admitting: Family Medicine

## 2013-07-11 ENCOUNTER — Encounter: Payer: Self-pay | Admitting: Family Medicine

## 2013-07-11 VITALS — BP 142/60 | HR 79 | Temp 98.5°F | Ht 65.0 in | Wt 123.5 lb

## 2013-07-11 DIAGNOSIS — R739 Hyperglycemia, unspecified: Secondary | ICD-10-CM

## 2013-07-11 DIAGNOSIS — L821 Other seborrheic keratosis: Secondary | ICD-10-CM

## 2013-07-11 DIAGNOSIS — R7309 Other abnormal glucose: Secondary | ICD-10-CM

## 2013-07-11 DIAGNOSIS — L608 Other nail disorders: Secondary | ICD-10-CM

## 2013-07-11 DIAGNOSIS — R7989 Other specified abnormal findings of blood chemistry: Secondary | ICD-10-CM

## 2013-07-11 DIAGNOSIS — R946 Abnormal results of thyroid function studies: Secondary | ICD-10-CM | POA: Diagnosis not present

## 2013-07-11 DIAGNOSIS — I10 Essential (primary) hypertension: Secondary | ICD-10-CM | POA: Diagnosis not present

## 2013-07-11 DIAGNOSIS — L603 Nail dystrophy: Secondary | ICD-10-CM | POA: Insufficient documentation

## 2013-07-11 NOTE — Patient Instructions (Signed)
Stop up front at check out for referral to foot doctor for nail care and dermatologist for moles on your face  Blood pressure is stable No changes in your medicines  Take care of yourself and stay active  Follow up in 6 months for an annual exam with labs prior

## 2013-07-11 NOTE — Progress Notes (Signed)
Subjective:    Patient ID: [name], female    DOB: 02/04/1921, 80 y.o.   MRN: [id number]  HPI Here for f/u of chronic medical problems  Still having problems with walking - with arthritis Uses her cane   bp is stable today  No cp or palpitations or headaches or edema  No side effects to medicines  BP Readings from Last 3 Encounters:  07/11/13 142/60  04/03/13 136/74  01/17/13 158/76     On benicar- ok with that    Wt is up 1 lb with bmi of 20 Her diet is stable - she tries to eat a healthy diet    Hx of hyperglycemia Lab Results  Component Value Date   HGBA1C 6.1 06/27/2013    She is cutting the sugar in diet- she stopped sweet tea for the most part as well as sweets   She wants a ref to derm for moles on her face  Also a ref to podiatry for foot care/ nail trimming -- it is to hard for her to trim her toenails    Hx of labile tsh Is wnl now Lab Results  Component Value Date   TSH 5.49 06/27/2013     Patient Active Problem List   Diagnosis Date Noted  . Callus of foot 01/17/2013  . Abnormal TSH 07/25/2012  . Encounter for Medicare annual wellness exam 07/10/2012  . Hyperglycemia 01/21/2012  . Leg pain, bilateral 10/15/2011  . Ankle edema 10/15/2011  . Rash and nonspecific skin eruption 08/07/2010  . VITAMIN B12 DEFICIENCY 08/22/2008  . UNSPECIFIED ANEMIA 08/19/2008  . OSTEOPENIA 07/09/2008  . CAROTID ARTERY STENOSIS 11/14/2006  . HYPERLIPIDEMIA 06/09/2006  . HYPERTENSION 06/09/2006  . CONSTIPATION, CHRONIC 06/09/2006  . OSTEOARTHRITIS 06/09/2006   Past Medical History  Diagnosis Date  . HLD (hyperlipidemia) 2002  . HTN (hypertension)   . Osteoarthritis   . Osteopenia     with arm fracture  . Carotid stenosis   . Chronic constipation    Past Surgical History  Procedure Laterality Date  . Colonoscopy  8/02    Diverticulosis  . Dexa  08/2002    Osteopenia  . Total hip arthroplasty  6/06  . Total hip arthroplasty  10/06  . Carotid  doppler  11/07    50-79 % RICA  . Carotid doppler      50-69 % RICA, lessa then 50% LICA-overall no change  . Abd us  12/07    Negative AAA  . Dexa  4/10    Osteopenia (worse at forearm T-1.98)-imp at LS -1.0   History  Substance Use Topics  . Smoking status: Never Smoker   . Smokeless tobacco: Not on file  . Alcohol Use: No   Family History  Problem Relation Age of Onset  . Heart attack Father   . Mitral valve prolapse Sister    Allergies  Allergen Reactions  . Alendronate Sodium     Leg tingling   . Hctz [Hydrochlorothiazide]     Dehydration/ syncope  . Hydrocodone     Syncope    Current Outpatient Prescriptions on File Prior to Visit  Medication Sig Dispense Refill  . aspirin 81 MG tablet Take 81 mg by mouth daily.        . beta carotene w/minerals (OCUVITE) tablet Take 1 tablet by mouth daily.      Marland Kitchen. CALCIUM-VITAMIN D PO Take 1 tablet by mouth 2 (two) times daily with a meal.        .  diphenhydrAMINE (BENADRYL) 25 mg capsule Take 25 mg by mouth daily as needed.        . docusate sodium (COLACE) 50 MG capsule Take 50-100 mg by mouth as needed.       Marland Kitchen. ibuprofen (ADVIL,MOTRIN) 200 MG tablet Take 200 mg by mouth as needed for pain.      Marland Kitchen. lovastatin (MEVACOR) 20 MG tablet TAKE ONE (1) TABLET BY MOUTH EVERY DAY  30 tablet  5  . olmesartan (BENICAR) 40 MG tablet TAKE ONE (1) TABLET BY MOUTH EVERY DAY  30 tablet  5  . Omega-3 Fatty Acids (FISH OIL PO) Take 1 capsule by mouth 2 (two) times daily.        . traMADol (ULTRAM) 50 MG tablet TAKE 1 TABLET BY MOUTH UP TO 3 TIMES DAILY AS NEEDED FOR PAIN  90 tablet  5  . vitamin C (ASCORBIC ACID) 500 MG tablet Take 500 mg by mouth daily.      . vitamin E 400 UNIT capsule Take 400 Units by mouth daily.        . Vitamins-Lipotropics (B-100 COMPLEX) TABS Take 1 tablet by mouth 2 (two) times daily.         No current facility-administered medications on file prior to visit.    Review of Systems Review of Systems  Constitutional:  Negative for fever, appetite change, fatigue and unexpected weight change.  Eyes: Negative for pain and visual disturbance.  Respiratory: Negative for cough and shortness of breath.   Cardiovascular: Negative for cp or palpitations    Gastrointestinal: Negative for nausea, diarrhea and constipation.  Genitourinary: Negative for urgency and frequency.  Skin: Negative for pallor or rash  pos for moles on face that bother her Neurological: Negative for weakness, light-headedness, numbness and headaches.  Hematological: Negative for adenopathy. Does not bruise/bleed easily.  Psychiatric/Behavioral: Negative for dysphoric mood. The patient is not nervous/anxious.         Objective:   Physical Exam  Constitutional: She appears well-developed and well-nourished. No distress.  Frail appearing elderly female  HOH  HENT:  Head: Normocephalic and atraumatic.  Mouth/Throat: Oropharynx is clear and moist.  Eyes: Conjunctivae and EOM are normal. Pupils are equal, round, and reactive to light. No scleral icterus.  Neck: Normal range of motion. Neck supple. Carotid bruit is present. No thyromegaly present.  Cardiovascular: Normal rate, regular rhythm and intact distal pulses.  Exam reveals no gallop.   Pulmonary/Chest: Effort normal and breath sounds normal. No respiratory distress. She has no wheezes. She has no rales.  Abdominal: Soft. Bowel sounds are normal.  Musculoskeletal: She exhibits no edema.  Lymphadenopathy:    She has no cervical adenopathy.  Neurological: She is alert. She has normal reflexes. No cranial nerve deficit. She exhibits normal muscle tone. Coordination normal.  Skin: Skin is warm and dry.  Several abraded SKs on face   Psychiatric: She has a normal mood and affect.          Assessment & Plan:

## 2013-07-11 NOTE — Progress Notes (Signed)
Pre visit review using our clinic review tool, if applicable. No additional management support is needed unless otherwise documented below in the visit note. 

## 2013-07-12 NOTE — Assessment & Plan Note (Signed)
bp in fair control at this time  BP Readings from Last 1 Encounters:  07/11/13 142/60   No changes needed Disc lifstyle change with low sodium diet and exercise

## 2013-07-12 NOTE — Assessment & Plan Note (Signed)
Ref to podiatry for help in trimming

## 2013-07-12 NOTE — Assessment & Plan Note (Signed)
Lab Results  Component Value Date   TSH 5.49 06/27/2013    This was wnl

## 2013-07-12 NOTE — Assessment & Plan Note (Signed)
Lab Results  Component Value Date   HGBA1C 6.1 06/27/2013    Disc control of sweets and carbs in diet and intake of protein

## 2013-07-12 NOTE — Assessment & Plan Note (Signed)
Ref to derm for eval of bothersome lesions on face

## 2013-07-13 ENCOUNTER — Other Ambulatory Visit: Payer: Self-pay | Admitting: Family Medicine

## 2013-07-13 NOTE — Telephone Encounter (Signed)
Rx called in as prescribed 

## 2013-07-13 NOTE — Telephone Encounter (Signed)
Px written for call in   

## 2013-07-13 NOTE — Telephone Encounter (Signed)
Electronic refill request, please advise  

## 2013-07-30 ENCOUNTER — Ambulatory Visit (INDEPENDENT_AMBULATORY_CARE_PROVIDER_SITE_OTHER): Payer: Medicare Other | Admitting: Podiatry

## 2013-07-30 ENCOUNTER — Encounter: Payer: Self-pay | Admitting: Podiatry

## 2013-07-30 VITALS — BP 112/77 | HR 86 | Resp 16 | Ht 66.0 in | Wt 122.0 lb

## 2013-07-30 DIAGNOSIS — M79609 Pain in unspecified limb: Secondary | ICD-10-CM

## 2013-07-30 DIAGNOSIS — B351 Tinea unguium: Secondary | ICD-10-CM

## 2013-07-30 NOTE — Progress Notes (Signed)
   Subjective:    Patient ID: [name], female    DOB: 04/20/1920, 80 y.o.   MRN: [id number]  HPI Comments: My toenails need trimming. They do not hurt. My toenails have been thick and discolored for a while. i trim my toenails.     Review of Systems     Objective:   Physical Exam: I have reviewed her past history medications allergies surgeries social history and review of systems. Pulses are palpable bilateral. Deep tendon reflexes are intact bilateral neurologic sensorium is intact per Semmes-Weinstein monofilament. Muscle strength is 5 over 5 dorsiflexors plantar flexors inverters everters all intrinsic musculature is intact. Orthopedic evaluation demonstrates all joints distal to the ankle a full range of motion without crepitation. Her nails are thick yellow dystrophic onychomycotic sharp incurvated and painful palpation as well as debridement.        Assessment & Plan:  Assessment: Pain in limb secondary to onychomycosis 1 through 5 bilateral.  Plan: Debridement of nails 1 through 5 bilateral covered service secondary to pain.

## 2013-07-31 DIAGNOSIS — L821 Other seborrheic keratosis: Secondary | ICD-10-CM | POA: Diagnosis not present

## 2013-07-31 DIAGNOSIS — L820 Inflamed seborrheic keratosis: Secondary | ICD-10-CM | POA: Diagnosis not present

## 2013-08-15 DIAGNOSIS — Z961 Presence of intraocular lens: Secondary | ICD-10-CM | POA: Diagnosis not present

## 2013-08-15 DIAGNOSIS — H43819 Vitreous degeneration, unspecified eye: Secondary | ICD-10-CM | POA: Diagnosis not present

## 2013-08-15 DIAGNOSIS — H40019 Open angle with borderline findings, low risk, unspecified eye: Secondary | ICD-10-CM | POA: Diagnosis not present

## 2013-08-15 DIAGNOSIS — H31099 Other chorioretinal scars, unspecified eye: Secondary | ICD-10-CM | POA: Diagnosis not present

## 2013-08-15 DIAGNOSIS — H353190 Nonexudative age-related macular degeneration, unspecified eye, stage unspecified: Secondary | ICD-10-CM | POA: Diagnosis not present

## 2013-09-05 DIAGNOSIS — I739 Peripheral vascular disease, unspecified: Secondary | ICD-10-CM | POA: Diagnosis not present

## 2013-09-05 DIAGNOSIS — I70219 Atherosclerosis of native arteries of extremities with intermittent claudication, unspecified extremity: Secondary | ICD-10-CM | POA: Diagnosis not present

## 2013-09-05 DIAGNOSIS — I6529 Occlusion and stenosis of unspecified carotid artery: Secondary | ICD-10-CM | POA: Diagnosis not present

## 2013-09-05 DIAGNOSIS — E785 Hyperlipidemia, unspecified: Secondary | ICD-10-CM | POA: Diagnosis not present

## 2013-10-10 ENCOUNTER — Other Ambulatory Visit: Payer: Self-pay | Admitting: Family Medicine

## 2013-10-10 DIAGNOSIS — H903 Sensorineural hearing loss, bilateral: Secondary | ICD-10-CM | POA: Diagnosis not present

## 2013-10-10 DIAGNOSIS — H6120 Impacted cerumen, unspecified ear: Secondary | ICD-10-CM | POA: Diagnosis not present

## 2013-10-24 ENCOUNTER — Ambulatory Visit: Payer: Medicare Other | Admitting: Podiatry

## 2013-11-08 ENCOUNTER — Other Ambulatory Visit: Payer: Self-pay | Admitting: Family Medicine

## 2013-12-12 DIAGNOSIS — Z23 Encounter for immunization: Secondary | ICD-10-CM | POA: Diagnosis not present

## 2014-01-11 ENCOUNTER — Other Ambulatory Visit: Payer: Self-pay | Admitting: Family Medicine

## 2014-01-11 NOTE — Telephone Encounter (Signed)
Rx called in as prescribed 

## 2014-01-11 NOTE — Telephone Encounter (Signed)
Electronic refill request, please advise  

## 2014-01-11 NOTE — Telephone Encounter (Signed)
Px written for call in   

## 2014-01-13 ENCOUNTER — Telehealth: Payer: Self-pay | Admitting: Family Medicine

## 2014-01-13 DIAGNOSIS — R739 Hyperglycemia, unspecified: Secondary | ICD-10-CM

## 2014-01-13 DIAGNOSIS — D649 Anemia, unspecified: Secondary | ICD-10-CM

## 2014-01-13 DIAGNOSIS — E538 Deficiency of other specified B group vitamins: Secondary | ICD-10-CM

## 2014-01-13 DIAGNOSIS — I10 Essential (primary) hypertension: Secondary | ICD-10-CM

## 2014-01-13 DIAGNOSIS — R7989 Other specified abnormal findings of blood chemistry: Secondary | ICD-10-CM

## 2014-01-13 DIAGNOSIS — E785 Hyperlipidemia, unspecified: Secondary | ICD-10-CM

## 2014-01-13 DIAGNOSIS — M8580 Other specified disorders of bone density and structure, unspecified site: Secondary | ICD-10-CM

## 2014-01-13 NOTE — Telephone Encounter (Signed)
Message copied by Judy PimpleWER, Min Collymore A on Sun Jan 13, 2014  7:37 PM ------      Message from: Alvina ChouWALSH, [name] J      Created: Mon Jan 07, 2014  2:44 PM      Regarding: Lab orders for Monday, 10.19.15       Patient is scheduled for CPX labs, please order future labs, Thanks , [name]       ------

## 2014-01-14 ENCOUNTER — Other Ambulatory Visit (INDEPENDENT_AMBULATORY_CARE_PROVIDER_SITE_OTHER): Payer: Medicare Other

## 2014-01-14 DIAGNOSIS — E538 Deficiency of other specified B group vitamins: Secondary | ICD-10-CM

## 2014-01-14 DIAGNOSIS — E785 Hyperlipidemia, unspecified: Secondary | ICD-10-CM

## 2014-01-14 DIAGNOSIS — R739 Hyperglycemia, unspecified: Secondary | ICD-10-CM

## 2014-01-14 DIAGNOSIS — M8580 Other specified disorders of bone density and structure, unspecified site: Secondary | ICD-10-CM | POA: Diagnosis not present

## 2014-01-14 DIAGNOSIS — D649 Anemia, unspecified: Secondary | ICD-10-CM

## 2014-01-14 DIAGNOSIS — R7989 Other specified abnormal findings of blood chemistry: Secondary | ICD-10-CM

## 2014-01-14 DIAGNOSIS — I10 Essential (primary) hypertension: Secondary | ICD-10-CM

## 2014-01-14 LAB — COMPREHENSIVE METABOLIC PANEL
ALK PHOS: 55 U/L (ref 39–117)
ALT: 13 U/L (ref 0–35)
AST: 20 U/L (ref 0–37)
Albumin: 3.4 g/dL — ABNORMAL LOW (ref 3.5–5.2)
BUN: 21 mg/dL (ref 6–23)
CO2: 27 mEq/L (ref 19–32)
CREATININE: 0.9 mg/dL (ref 0.4–1.2)
Calcium: 9.3 mg/dL (ref 8.4–10.5)
Chloride: 104 mEq/L (ref 96–112)
GFR: 65.65 mL/min (ref >60.00)
Glucose, Bld: 104 mg/dL — ABNORMAL HIGH (ref 70–99)
Potassium: 3.9 mEq/L (ref 3.5–5.1)
Sodium: 140 mEq/L (ref 135–145)
Total Bilirubin: 0.7 mg/dL (ref 0.2–1.2)
Total Protein: 7.4 g/dL (ref 6.0–8.3)

## 2014-01-14 LAB — CBC WITH DIFFERENTIAL/PLATELET
BASOS ABS: 0 10*3/uL (ref 0.0–0.1)
Basophils Relative: 0.3 % (ref 0.0–3.0)
EOS ABS: 0.1 10*3/uL (ref 0.0–0.7)
Eosinophils Relative: 1.1 % (ref 0.0–5.0)
HCT: 40 % (ref 36.0–46.0)
HEMOGLOBIN: 13 g/dL (ref 12.0–15.0)
Lymphocytes Relative: 19.7 % (ref 12.0–46.0)
Lymphs Abs: 1.6 10*3/uL (ref 0.7–4.0)
MCHC: 32.5 g/dL (ref 30.0–36.0)
MCV: 95.8 fl (ref 78.0–100.0)
Monocytes Absolute: 0.8 10*3/uL (ref 0.1–1.0)
Monocytes Relative: 9.1 % (ref 3.0–12.0)
NEUTROS ABS: 5.8 10*3/uL (ref 1.4–7.7)
NEUTROS PCT: 69.8 % (ref 43.0–77.0)
Platelets: 231 10*3/uL (ref 150.0–400.0)
RBC: 4.18 Mil/uL (ref 3.87–5.11)
RDW: 13.7 % (ref 11.5–15.5)
WBC: 8.3 10*3/uL (ref 4.0–10.5)

## 2014-01-14 LAB — TSH: TSH: 3.55 u[IU]/mL (ref 0.35–4.50)

## 2014-01-14 LAB — LIPID PANEL
CHOLESTEROL: 137 mg/dL (ref 0–200)
HDL: 42.6 mg/dL (ref >39.00)
LDL Cholesterol: 73 mg/dL (ref 0–99)
NonHDL: 94.4
TRIGLYCERIDES: 106 mg/dL (ref 0.0–149.0)
Total CHOL/HDL Ratio: 3
VLDL: 21.2 mg/dL (ref 0.0–40.0)

## 2014-01-14 LAB — VITAMIN D 25 HYDROXY (VIT D DEFICIENCY, FRACTURES): VITD: 50.53 ng/mL (ref 30.00–100.00)

## 2014-01-14 LAB — HEMOGLOBIN A1C: Hgb A1c MFr Bld: 6 % (ref 4.6–6.5)

## 2014-01-14 LAB — VITAMIN B12: Vitamin B-12: >1500 pg/mL — ABNORMAL HIGH (ref 211–911)

## 2014-01-21 ENCOUNTER — Encounter: Payer: Self-pay | Admitting: Family Medicine

## 2014-01-21 ENCOUNTER — Encounter: Payer: Self-pay | Admitting: *Deleted

## 2014-01-21 ENCOUNTER — Ambulatory Visit (INDEPENDENT_AMBULATORY_CARE_PROVIDER_SITE_OTHER): Payer: Medicare Other | Admitting: Family Medicine

## 2014-01-21 VITALS — BP 122/64 | HR 92 | Temp 98.5°F | Ht 65.0 in | Wt 122.2 lb

## 2014-01-21 DIAGNOSIS — M79605 Pain in left leg: Secondary | ICD-10-CM

## 2014-01-21 DIAGNOSIS — M8580 Other specified disorders of bone density and structure, unspecified site: Secondary | ICD-10-CM

## 2014-01-21 DIAGNOSIS — E538 Deficiency of other specified B group vitamins: Secondary | ICD-10-CM

## 2014-01-21 DIAGNOSIS — Z0000 Encounter for general adult medical examination without abnormal findings: Secondary | ICD-10-CM | POA: Diagnosis not present

## 2014-01-21 DIAGNOSIS — Z23 Encounter for immunization: Secondary | ICD-10-CM

## 2014-01-21 DIAGNOSIS — I10 Essential (primary) hypertension: Secondary | ICD-10-CM | POA: Diagnosis not present

## 2014-01-21 DIAGNOSIS — M79604 Pain in right leg: Secondary | ICD-10-CM

## 2014-01-21 DIAGNOSIS — R739 Hyperglycemia, unspecified: Secondary | ICD-10-CM

## 2014-01-21 DIAGNOSIS — E785 Hyperlipidemia, unspecified: Secondary | ICD-10-CM

## 2014-01-21 MED ORDER — LOVASTATIN 20 MG PO TABS: ORAL_TABLET | ORAL | Status: DC

## 2014-01-21 MED ORDER — OLMESARTAN MEDOXOMIL 40 MG PO TABS: ORAL_TABLET | ORAL | Status: DC

## 2014-01-21 NOTE — Assessment & Plan Note (Signed)
dexa 5/14 with bone loss in forearm and nl in spine Disc need for calcium/ vitamin D/ wt bearing exercise and bone density test every 2 y to monitor Disc safety/ fracture risk in detail    D level is good in 2450s

## 2014-01-21 NOTE — Assessment & Plan Note (Signed)
supra therapeutic level on oral supplementation Pt feels better taking this

## 2014-01-21 NOTE — Patient Instructions (Signed)
Take care of yourself  prevnar vaccine today  Keep eating regular meals and get enough fluids Stay as active and social as you can

## 2014-01-21 NOTE — Assessment & Plan Note (Signed)
Lab Results  Component Value Date   HGBA1C 6.0 01/14/2014    Doing well controlling this in diet

## 2014-01-21 NOTE — Assessment & Plan Note (Signed)
Reviewed health habits including diet and exercise and skin cancer prevention Reviewed appropriate screening tests for age  Also reviewed health mt list, fam hx and immunization status , as well as social and family history   Labs reviewed See HPI Declines some health mt due to advanced age , also declines breast exam prevnar today

## 2014-01-21 NOTE — Progress Notes (Signed)
Pre visit review using our clinic review tool, if applicable. No additional management support is needed unless otherwise documented below in the visit note. 

## 2014-01-21 NOTE — Progress Notes (Signed)
Subjective:    Patient ID: [name], female    DOB: [birth year], 80 y.o.   MRN: [id number]  HPI I have personally reviewed the Medicare Annual Wellness questionnaire and have noted 1. The patient's medical and social history 2. Their use of alcohol, tobacco or illicit drugs 3. Their current medications and supplements 4. The patient's functional ability including ADL's, fall risks, home safety risks and hearing or visual             impairment. 5. Diet and physical activities 6. Evidence for depression or mood disorders  The patients weight, height, BMI have been recorded in the chart and visual acuity is per eye clinic.  I have made referrals, counseling and provided education to the patient based review of the above and I have provided the pt with a written personalized care plan for preventive services.  Doing pretty well overall No changes Just got her driver's license - she limits it to short distances - is more comfortable with that   She does not sleep well at night - wakes up early  Sometimes "her nerves" bother her at night   Hearing is bad-even with hearing aides   See scanned forms.  Routine anticipatory guidance given to patient.  See health maintenance. Colon cancer screening- declined colonosc due to age  , she does not want colon screening due to age  Breast cancer screening -mammogram 05 - declines them due to age  Self breast exam- no lumps or changes  Flu vaccine- had 9/15  Tetanus vaccine 4/10  Pneumovax 1/96 - is up to date    Wants to get prevar today  Zoster vaccine 4/11 Advance directive she has one drawn up  Cognitive function addressed- see scanned forms- and if abnormal then additional documentation follows. -pt states her memory is good-still does her housework and finances   PMH and SH reviewed  Meds, vitals, and allergies reviewed.   ROS: See HPI.  Otherwise negative.      B12 def- oral supplementation  Lab Results  Component Value  Date   VITAMINB12 >1500* 01/14/2014    Hyperglycemia Lab Results  Component Value Date   HGBA1C 6.0 01/14/2014    Wt is stable as well Is watching her sugar - improved  She drinks unsweet tea   bp is stable today  No cp or palpitations or headaches or edema  No side effects to medicines  BP Readings from Last 3 Encounters:  01/21/14 122/64  07/30/13 112/77  07/11/13 142/60      Hyperlipidemia mevacor and diet  Lab Results  Component Value Date   CHOL 137 01/14/2014   CHOL 157 07/10/2012   CHOL 133 01/14/2012   Lab Results  Component Value Date   HDL 42.60 01/14/2014   HDL 55.30 07/10/2012   HDL 40.98 01/14/2012   Lab Results  Component Value Date   LDLCALC 73 01/14/2014   LDLCALC 77 07/10/2012   LDLCALC 63 01/14/2012   Lab Results  Component Value Date   TRIG 106.0 01/14/2014   TRIG 123.0 07/10/2012   TRIG 98.0 01/14/2012   Lab Results  Component Value Date   CHOLHDL 3 01/14/2014   CHOLHDL 3 07/10/2012   CHOLHDL 3 01/14/2012   Lab Results  Component Value Date   LDLDIRECT 70.8 09/05/2009   LDLDIRECT 61.2 03/05/2009   still in good control Avoids fatty foods  Stays quite active    Patient Active Problem List   Diagnosis Date Noted  .  Dystrophic nail 07/11/2013  . Seborrheic keratoses 07/11/2013  . Callus of foot 01/17/2013  . Abnormal TSH 07/25/2012  . Encounter for Medicare annual wellness exam 07/10/2012  . Hyperglycemia 01/21/2012  . Leg pain, bilateral 10/15/2011  . Ankle edema 10/15/2011  . Rash and nonspecific skin eruption 08/07/2010  . B12 deficiency 08/22/2008  . Anemia 08/19/2008  . Osteopenia 07/09/2008  . CAROTID ARTERY STENOSIS 11/14/2006  . Hyperlipidemia LDL goal <130 06/09/2006  . Essential hypertension 06/09/2006  . CONSTIPATION, CHRONIC 06/09/2006  . OSTEOARTHRITIS 06/09/2006   Past Medical History  Diagnosis Date  . HLD (hyperlipidemia) 2002  . HTN (hypertension)   . Osteoarthritis   . Osteopenia     with arm fracture   . Carotid stenosis   . Chronic constipation    Past Surgical History  Procedure Laterality Date  . Colonoscopy  8/02    Diverticulosis  . Dexa  08/2002    Osteopenia  . Total hip arthroplasty  6/06  . Total hip arthroplasty  10/06  . Carotid doppler  11/07    50-79 % RICA  . Carotid doppler      50-69 % RICA, lessa then 50% LICA-overall no change  . Abd us  12/07    Negative AAA  . Dexa  4/10    Osteopenia (worse at forearm T-1.98)-imp at LS -1.0   History  Substance Use Topics  . Smoking status: Never Smoker   . Smokeless tobacco: Not on file  . Alcohol Use: No   Family History  Problem Relation Age of Onset  . Heart attack Father   . Mitral valve prolapse Sister    Allergies  Allergen Reactions  . Alendronate Sodium     Leg tingling   . Hctz [Hydrochlorothiazide]     Dehydration/ syncope  . Hydrocodone     Syncope    Current Outpatient Prescriptions on File Prior to Visit  Medication Sig Dispense Refill  . aspirin 81 MG tablet Take 81 mg by mouth daily.        Marland Kitchen. BENICAR 40 MG tablet TAKE 1 TABLET BY MOUTH DAILY  30 tablet  3  . CALCIUM-VITAMIN D PO Take 1 tablet by mouth 2 (two) times daily with a meal.        . diphenhydrAMINE (BENADRYL) 25 mg capsule Take 25 mg by mouth daily as needed.        . docusate sodium (COLACE) 50 MG capsule Take 50-100 mg by mouth as needed.       Marland Kitchen. ibuprofen (ADVIL,MOTRIN) 200 MG tablet Take 200 mg by mouth as needed for pain.      Marland Kitchen. lovastatin (MEVACOR) 20 MG tablet TAKE 1 TABLET BY MOUTH DAILY  30 tablet  3  . Omega-3 Fatty Acids (FISH OIL PO) Take 1 capsule by mouth 2 (two) times daily.        . traMADol (ULTRAM) 50 MG tablet TAKE 1 TABLET BY MOUTH UP TO 3 TIMES DAILY AS NEEDED FOR PAIN  90 tablet  5  . vitamin E 400 UNIT capsule Take 400 Units by mouth daily.        . Vitamins-Lipotropics (B-100 COMPLEX) TABS Take 1 tablet by mouth 2 (two) times daily.         No current facility-administered medications on file prior to  visit.      Review of Systems Review of Systems  Constitutional: Negative for fever, appetite change, fatigue and unexpected weight change.  Eyes: Negative for pain and visual  disturbance.  Respiratory: Negative for cough and shortness of breath.   Cardiovascular: Negative for cp or palpitations    Gastrointestinal: Negative for nausea, diarrhea and constipation.  Genitourinary: Negative for urgency and frequency.  Skin: Negative for pallor or rash   MSK pos for baseline chronic leg pain  Neurological: Negative for weakness, light-headedness, numbness and headaches.  Hematological: Negative for adenopathy. Does not bruise/bleed easily.  Psychiatric/Behavioral: Negative for dysphoric mood. The patient is not nervous/anxious.         Objective:   Physical Exam  Constitutional: She appears well-developed and well-nourished. No distress.  HENT:  Head: Normocephalic and atraumatic.  Right Ear: External ear normal.  Left Ear: External ear normal.  Nose: Nose normal.  Mouth/Throat: Oropharynx is clear and moist.  Very HOH even with hearing aides   Eyes: Conjunctivae and EOM are normal. Pupils are equal, round, and reactive to light. Right eye exhibits no discharge. Left eye exhibits no discharge. No scleral icterus.  Neck: Normal range of motion. Neck supple. No JVD present. Carotid bruit is present. No thyromegaly present.  Cardiovascular: Normal rate, regular rhythm, normal heart sounds and intact distal pulses.  Exam reveals no gallop.   Pulmonary/Chest: Effort normal and breath sounds normal. No respiratory distress. She has no wheezes. She has no rales.  Abdominal: Soft. Bowel sounds are normal. She exhibits no distension and no mass. There is no tenderness.  Genitourinary:  Pt declines breast exam  Musculoskeletal: She exhibits no edema and no tenderness.  Lymphadenopathy:    She has no cervical adenopathy.  Neurological: She is alert. She has normal reflexes. No cranial nerve  deficit. She exhibits normal muscle tone. Coordination normal.  Skin: Skin is warm and dry. No rash noted. No erythema. No pallor.  SKs diffusely  Thickened toenails-well trimmed   Psychiatric: She has a normal mood and affect.          Assessment & Plan:   Problem List Items Addressed This Visit     Cardiovascular and Mediastinum   Essential hypertension      bp in fair control at this time  BP Readings from Last 1 Encounters:  01/21/14 122/64   No changes needed Disc lifstyle change with low sodium diet and exercise  Labs reviewed     Relevant Medications      olmesartan (BENICAR) tablet      lovastatin (MEVACOR) tablet     Digestive   B12 deficiency     supra therapeutic level on oral supplementation Pt feels better taking this        Musculoskeletal and Integument   Osteopenia     dexa 5/14 with bone loss in forearm and nl in spine Disc need for calcium/ vitamin D/ wt bearing exercise and bone density test every 2 y to monitor Disc safety/ fracture risk in detail    D level is good in 50s         Other   Hyperlipidemia LDL goal <130     Disc goals for lipids and reasons to control them Rev labs with pt Rev low sat fat diet in detail In good control with statin and diet     Relevant Medications      olmesartan (BENICAR) tablet      lovastatin (MEVACOR) tablet   Leg pain, bilateral     Unchanged  Walks with cane  Uses tramadol-no adv eff or falls     Hyperglycemia      Lab Results  Component  Value Date   HGBA1C 6.0 01/14/2014    Doing well controlling this in diet     Encounter for Medicare annual wellness exam - Primary     Reviewed health habits including diet and exercise and skin cancer prevention Reviewed appropriate screening tests for age  Also reviewed health mt list, fam hx and immunization status , as well as social and family history   Labs reviewed See HPI Declines some health mt due to advanced age , also declines breast  exam prevnar today     Other Visit Diagnoses   Need for vaccination with 13-polyvalent pneumococcal conjugate vaccine        Relevant Orders       Pneumococcal conjugate vaccine 13-valent (Completed)

## 2014-01-21 NOTE — Assessment & Plan Note (Signed)
bp in fair control at this time  BP Readings from Last 1 Encounters:  01/21/14 122/64   No changes needed Disc lifstyle change with low sodium diet and exercise  Labs reviewed

## 2014-01-21 NOTE — Assessment & Plan Note (Signed)
Disc goals for lipids and reasons to control them Rev labs with pt Rev low sat fat diet in detail In good control with statin and diet

## 2014-01-23 NOTE — Assessment & Plan Note (Signed)
Unchanged  Walks with cane  Uses tramadol-no adv eff or falls

## 2014-03-13 DIAGNOSIS — I6529 Occlusion and stenosis of unspecified carotid artery: Secondary | ICD-10-CM | POA: Diagnosis not present

## 2014-03-13 DIAGNOSIS — I739 Peripheral vascular disease, unspecified: Secondary | ICD-10-CM | POA: Diagnosis not present

## 2014-04-08 ENCOUNTER — Telehealth: Payer: Self-pay

## 2014-04-08 NOTE — Telephone Encounter (Signed)
Celene SkeenBetty Carter request refills for mevacor,benicar and tramadol sent to [facility] [address] Spoke with Designer, fashion/clothingMike pharmacist at [facility] [address] and he will [place]. Kathie RhodesBetty voiced understanding.

## 2014-04-16 ENCOUNTER — Telehealth: Payer: Self-pay

## 2014-04-16 NOTE — Telephone Encounter (Signed)
[name] pts sister left v/m; pt request [name] to call due to pt not hearing well; request substitute med for benicar due to cost of [facility] [address]

## 2014-04-16 NOTE — Telephone Encounter (Signed)
Please have her call her ins to find out what ARB (angiotensin receptor blocker) for high blood pressure is/are preferred and give me a list -I will choose one Thanks

## 2014-04-18 NOTE — Telephone Encounter (Signed)
Called phone # and no answer and no voicemail (kept ringing)

## 2014-04-22 NOTE — Telephone Encounter (Signed)
Spoke to patient but she could not really hear. She asked if I could call her sister Kathie RhodesBetty. I have spoken with Kathie RhodesBetty and inform her of Dr. Royden Purlower's comments.

## 2014-05-14 DIAGNOSIS — I6529 Occlusion and stenosis of unspecified carotid artery: Secondary | ICD-10-CM | POA: Diagnosis not present

## 2014-05-14 DIAGNOSIS — I739 Peripheral vascular disease, unspecified: Secondary | ICD-10-CM | POA: Diagnosis not present

## 2014-05-14 DIAGNOSIS — E785 Hyperlipidemia, unspecified: Secondary | ICD-10-CM | POA: Diagnosis not present

## 2014-05-14 DIAGNOSIS — I10 Essential (primary) hypertension: Secondary | ICD-10-CM | POA: Diagnosis not present

## 2014-06-05 ENCOUNTER — Telehealth: Payer: Self-pay

## 2014-06-05 NOTE — Telephone Encounter (Signed)
Pt walked in; [facility] at [facility] and BP was 180 and then rechecked 206/100. Pt said for few days had funny feeling in top of head but not now;no SOB,CP or dizziness. Pt taking Benicar 40 mg daily.

## 2014-06-05 NOTE — Telephone Encounter (Signed)
I checked her bp - it was 158/70 L and 160/70 in the R She feels fine  Will have her f/u within a month for another bp visit and she will update us if problems in the meantime  Her niece was present and understood directions

## 2014-06-18 ENCOUNTER — Ambulatory Visit (INDEPENDENT_AMBULATORY_CARE_PROVIDER_SITE_OTHER): Payer: Medicare Other | Admitting: Family Medicine

## 2014-06-18 ENCOUNTER — Encounter: Payer: Self-pay | Admitting: Family Medicine

## 2014-06-18 VITALS — BP 132/70 | HR 85 | Temp 98.0°F | Ht 65.0 in | Wt 121.8 lb

## 2014-06-18 DIAGNOSIS — Z7409 Other reduced mobility: Secondary | ICD-10-CM

## 2014-06-18 DIAGNOSIS — I10 Essential (primary) hypertension: Secondary | ICD-10-CM

## 2014-06-18 DIAGNOSIS — L603 Nail dystrophy: Secondary | ICD-10-CM

## 2014-06-18 MED ORDER — TRAMADOL HCL 50 MG PO TABS: ORAL_TABLET | ORAL | Status: DC

## 2014-06-18 NOTE — Progress Notes (Signed)
Pre visit review using our clinic review tool, if applicable. No additional management support is needed unless otherwise documented below in the visit note. 

## 2014-06-18 NOTE — Patient Instructions (Signed)
Your blood pressure is much better  We will continue to watch it  Make your own appointment with the foot doctor for help with your nails  No change in medicines Watch your diet for sugar  I think you should use your walker instead of your cane - it gives better support and prevents falls   Follow up for your annual exam in early November

## 2014-06-18 NOTE — Progress Notes (Signed)
Subjective:    Patient ID: [name], female    DOB: 01/23/1921, 81 y.o.   MRN: [id number]  HPI Here for f/u of HTN   Had come in last week with elevated bp [facility] the senior center  Came in here and bp was 160/70  Now improved  BP Readings from Last 3 Encounters:  06/18/14 142/68  01/21/14 122/64  07/30/13 112/77    Is feeling better overall   Has hyperglycemia Lab Results  Component Value Date   HGBA1C 6.0 01/14/2014  is watching sugar in diet carefully - gave up sweets (just occ graham cracker)   Having trouble getting around with leg pain/arthritis- that if frustrated  Uses a cane  Has a walker at home- should probably use that more   No falls   Patient Active Problem List   Diagnosis Date Noted  . Dystrophic nail 07/11/2013  . Seborrheic keratoses 07/11/2013  . Callus of foot 01/17/2013  . Encounter for Medicare annual wellness exam 07/10/2012  . Hyperglycemia 01/21/2012  . Leg pain, bilateral 10/15/2011  . Ankle edema 10/15/2011  . B12 deficiency 08/22/2008  . Osteopenia 07/09/2008  . CAROTID ARTERY STENOSIS 11/14/2006  . Hyperlipidemia LDL goal <130 06/09/2006  . Essential hypertension 06/09/2006  . CONSTIPATION, CHRONIC 06/09/2006  . OSTEOARTHRITIS 06/09/2006   Past Medical History  Diagnosis Date  . HLD (hyperlipidemia) 2002  . HTN (hypertension)   . Osteoarthritis   . Osteopenia     with arm fracture  . Carotid stenosis   . Chronic constipation    Past Surgical History  Procedure Laterality Date  . Colonoscopy  8/02    Diverticulosis  . Dexa  08/2002    Osteopenia  . Total hip arthroplasty  6/06  . Total hip arthroplasty  10/06  . Carotid doppler  11/07    50-79 % RICA  . Carotid doppler      50-69 % RICA, lessa then 50% LICA-overall no change  . Abd us  12/07    Negative AAA  . Dexa  4/10    Osteopenia (worse at forearm T-1.98)-imp at LS -1.0   History  Substance Use Topics  . Smoking status: Never Smoker   . Smokeless  tobacco: Not on file  . Alcohol Use: No   Family History  Problem Relation Age of Onset  . Heart attack Father   . Mitral valve prolapse Sister    Allergies  Allergen Reactions  . Alendronate Sodium     Leg tingling   . Hctz [Hydrochlorothiazide]     Dehydration/ syncope  . Hydrocodone     Syncope    Current Outpatient Prescriptions on File Prior to Visit  Medication Sig Dispense Refill  . Ascorbic Acid (VITAMIN C PO) Take 1 tablet by mouth daily.    Marland Kitchen. aspirin 81 MG tablet Take 81 mg by mouth daily.      Marland Kitchen. CALCIUM-VITAMIN D PO Take 1 tablet by mouth 2 (two) times daily with a meal.      . diphenhydrAMINE (BENADRYL) 25 mg capsule Take 25 mg by mouth daily as needed.      . docusate sodium (COLACE) 50 MG capsule Take 50-100 mg by mouth as needed.     Marland Kitchen. ibuprofen (ADVIL,MOTRIN) 200 MG tablet Take 200 mg by mouth as needed for pain.    Marland Kitchen. lovastatin (MEVACOR) 20 MG tablet TAKE 1 TABLET BY MOUTH DAILY 30 tablet 11  . Multiple Vitamins-Minerals (PRESERVISION AREDS PO) Take 1 tablet by  mouth daily.    Marland Kitchen olmesartan (BENICAR) 40 MG tablet TAKE 1 TABLET BY MOUTH DAILY 30 tablet 11  . Omega-3 Fatty Acids (FISH OIL PO) Take 1 capsule by mouth 2 (two) times daily.      . vitamin E 400 UNIT capsule Take 400 Units by mouth daily.      . Vitamins-Lipotropics (B-100 COMPLEX) TABS Take 1 tablet by mouth 2 (two) times daily.       No current facility-administered medications on file prior to visit.     Review of Systems Review of Systems  Constitutional: Negative for fever, appetite change, fatigue and unexpected weight change.  Eyes: Negative for pain and visual disturbance.  Respiratory: Negative for cough and shortness of breath.   Cardiovascular: Negative for cp or palpitations    Gastrointestinal: Negative for nausea, diarrhea and constipation.  Genitourinary: Negative for urgency and frequency.  Skin: Negative for pallor or rash   Neurological: Negative for weakness, light-headedness,  numbness and headaches. pos for poor balance MSK pos for chronic knee and spine pain from OA  Hematological: Negative for adenopathy. Does not bruise/bleed easily.  Psychiatric/Behavioral: Negative for dysphoric mood. The patient is not nervous/anxious.         Objective:   Physical Exam  Constitutional: She appears well-developed and well-nourished. No distress.  Frail appearing elderly female  HENT:  Head: Normocephalic and atraumatic.  Mouth/Throat: Oropharynx is clear and moist.  Very HOH  Eyes: Conjunctivae and EOM are normal. Pupils are equal, round, and reactive to light.  Neck: Normal range of motion. Neck supple. No JVD present. Carotid bruit is not present. No thyromegaly present.  Cardiovascular: Normal rate, regular rhythm, normal heart sounds and intact distal pulses.  Exam reveals no gallop.   Pulmonary/Chest: Effort normal and breath sounds normal. No respiratory distress. She has no wheezes. She has no rales.  No crackles  Abdominal: Soft. Bowel sounds are normal. She exhibits no distension, no abdominal bruit and no mass. There is no tenderness.  Musculoskeletal: She exhibits no edema.  Lymphadenopathy:    She has no cervical adenopathy.  Neurological: She is alert. She has normal reflexes.  Using 4 prong cane-still difficulty ambulating  Skin: Skin is warm and dry. No rash noted.  Psychiatric: She has a normal mood and affect.          Assessment & Plan:   Problem List Items Addressed This Visit      Cardiovascular and Mediastinum   Essential hypertension - Primary    bp is much improved  No further problems Unsure cause of previous bp spike  Disc diet -inst to avoid excess sodium  Continue to follow         Musculoskeletal and Integument   Dystrophic nail    Pt needs nails trimmed -she will make her own podiatry appt         Other   Mobility impaired    Disc fall risk  OA prevents nl gait-using cane Suggested walker instead of cane for more  stability Pt agrees and has a walker at home to switch to

## 2014-06-20 NOTE — Assessment & Plan Note (Signed)
bp is much improved  No further problems Unsure cause of previous bp spike  Disc diet -inst to avoid excess sodium  Continue to follow

## 2014-06-20 NOTE — Assessment & Plan Note (Signed)
Pt needs nails trimmed -she will make her own podiatry appt

## 2014-06-20 NOTE — Assessment & Plan Note (Signed)
Disc fall risk  OA prevents nl gait-using cane Suggested walker instead of cane for more stability Pt agrees and has a walker at home to switch to

## 2014-07-05 ENCOUNTER — Telehealth: Payer: Self-pay

## 2014-07-05 NOTE — Telephone Encounter (Signed)
Pt request refill for tramadol; advised [facility] [address] Should have available refills and will have her sister ck with pharmacy.

## 2014-07-10 ENCOUNTER — Other Ambulatory Visit: Payer: Self-pay

## 2014-07-10 NOTE — Telephone Encounter (Signed)
Tramadol refill request from [facility].  Patient last seen 06/18/2014.  Last filled 06/18/2014 with refills.  Informed pharmacy this has already been done.

## 2014-09-04 DIAGNOSIS — H40013 Open angle with borderline findings, low risk, bilateral: Secondary | ICD-10-CM | POA: Diagnosis not present

## 2014-09-04 DIAGNOSIS — H3531 Nonexudative age-related macular degeneration: Secondary | ICD-10-CM | POA: Diagnosis not present

## 2014-09-04 DIAGNOSIS — H31091 Other chorioretinal scars, right eye: Secondary | ICD-10-CM | POA: Diagnosis not present

## 2014-09-04 DIAGNOSIS — Z961 Presence of intraocular lens: Secondary | ICD-10-CM | POA: Diagnosis not present

## 2014-09-11 ENCOUNTER — Ambulatory Visit (INDEPENDENT_AMBULATORY_CARE_PROVIDER_SITE_OTHER): Payer: Medicare Other | Admitting: Podiatry

## 2014-09-11 DIAGNOSIS — B351 Tinea unguium: Secondary | ICD-10-CM

## 2014-09-11 DIAGNOSIS — M79676 Pain in unspecified toe(s): Secondary | ICD-10-CM

## 2014-09-11 NOTE — Progress Notes (Signed)
Patient presents to the office today with a chief complaint of painful elongated toenails.  Objective: Pulses are palpable bilateral. Nails are thick yellow dystrophic clinically mycotic and painful palpation.  Assessment: Pain in limb secondary to onychomycosis 1 through 5 bilateral.  Plan: Debridement of nails 1 through 5 bilateral covered service secondary to pain.  

## 2014-12-05 DIAGNOSIS — Z23 Encounter for immunization: Secondary | ICD-10-CM | POA: Diagnosis not present

## 2014-12-18 ENCOUNTER — Ambulatory Visit (INDEPENDENT_AMBULATORY_CARE_PROVIDER_SITE_OTHER): Payer: Medicare Other | Admitting: Podiatry

## 2014-12-18 ENCOUNTER — Encounter: Payer: Self-pay | Admitting: Podiatry

## 2014-12-18 DIAGNOSIS — M79676 Pain in unspecified toe(s): Secondary | ICD-10-CM

## 2014-12-18 DIAGNOSIS — B351 Tinea unguium: Secondary | ICD-10-CM

## 2014-12-18 NOTE — Progress Notes (Signed)
She presents today chief complaint of painful elongated toenails.  Objective: Vital signs are stable she is alert and oriented 3. Nails are thick yellow dystrophic onychomycotic and painful palpation.  Assessment: Pain limb secondary to onychomycosis 1 through 5 bilateral.  Plan: Discussed etiology pathology conservative versus surgical therapies. Debrided nails 1 through 5 bilateral prescription service secondary to pain.

## 2014-12-24 DIAGNOSIS — I10 Essential (primary) hypertension: Secondary | ICD-10-CM | POA: Diagnosis not present

## 2014-12-24 DIAGNOSIS — I6529 Occlusion and stenosis of unspecified carotid artery: Secondary | ICD-10-CM | POA: Diagnosis not present

## 2014-12-24 DIAGNOSIS — E785 Hyperlipidemia, unspecified: Secondary | ICD-10-CM | POA: Diagnosis not present

## 2014-12-24 DIAGNOSIS — I739 Peripheral vascular disease, unspecified: Secondary | ICD-10-CM | POA: Diagnosis not present

## 2015-01-12 ENCOUNTER — Other Ambulatory Visit: Payer: Self-pay | Admitting: Family Medicine

## 2015-01-13 NOTE — Telephone Encounter (Signed)
Rx called in as prescribed 

## 2015-01-13 NOTE — Telephone Encounter (Signed)
Px written for call in   

## 2015-01-13 NOTE — Telephone Encounter (Signed)
Electronic refill request, pt has CPE scheduled for 02/11/15, last refilled on 06/18/14 #90 with 5 additional refills, please advise

## 2015-02-03 ENCOUNTER — Telehealth: Payer: Self-pay | Admitting: Family Medicine

## 2015-02-03 DIAGNOSIS — I10 Essential (primary) hypertension: Secondary | ICD-10-CM

## 2015-02-03 DIAGNOSIS — E538 Deficiency of other specified B group vitamins: Secondary | ICD-10-CM

## 2015-02-03 DIAGNOSIS — E785 Hyperlipidemia, unspecified: Secondary | ICD-10-CM

## 2015-02-03 DIAGNOSIS — R739 Hyperglycemia, unspecified: Secondary | ICD-10-CM

## 2015-02-03 NOTE — Telephone Encounter (Signed)
-----   Message from Baldomero LamyNatasha C Chavers sent at 01/31/2015  2:22 PM EDT ----- Regarding: Cpx labs Thurs 11/10 need orders, Thanks! :-) Please order  future cpx labs for pt's upcoming lab appt. Thanks Rodney Boozeasha

## 2015-02-06 ENCOUNTER — Other Ambulatory Visit (INDEPENDENT_AMBULATORY_CARE_PROVIDER_SITE_OTHER): Payer: Medicare Other

## 2015-02-06 DIAGNOSIS — R739 Hyperglycemia, unspecified: Secondary | ICD-10-CM | POA: Diagnosis not present

## 2015-02-06 DIAGNOSIS — I10 Essential (primary) hypertension: Secondary | ICD-10-CM | POA: Diagnosis not present

## 2015-02-06 DIAGNOSIS — E785 Hyperlipidemia, unspecified: Secondary | ICD-10-CM

## 2015-02-06 DIAGNOSIS — E538 Deficiency of other specified B group vitamins: Secondary | ICD-10-CM | POA: Diagnosis not present

## 2015-02-06 LAB — COMPREHENSIVE METABOLIC PANEL
ALT: 10 U/L (ref 0–35)
AST: 15 U/L (ref 0–37)
Albumin: 4 g/dL (ref 3.5–5.2)
Alkaline Phosphatase: 55 U/L (ref 39–117)
BUN: 17 mg/dL (ref 6–23)
CHLORIDE: 104 meq/L (ref 96–112)
CO2: 28 meq/L (ref 19–32)
CREATININE: 0.87 mg/dL (ref 0.40–1.20)
Calcium: 9.8 mg/dL (ref 8.4–10.5)
GFR: 64.63 mL/min (ref >60.00)
Glucose, Bld: 109 mg/dL — ABNORMAL HIGH (ref 70–99)
Potassium: 4.1 mEq/L (ref 3.5–5.1)
Sodium: 138 mEq/L (ref 135–145)
Total Bilirubin: 0.6 mg/dL (ref 0.2–1.2)
Total Protein: 7 g/dL (ref 6.0–8.3)

## 2015-02-06 LAB — LIPID PANEL
CHOL/HDL RATIO: 3
Cholesterol: 136 mg/dL (ref 0–200)
HDL: 51.7 mg/dL (ref >39.00)
LDL Cholesterol: 67 mg/dL (ref 0–99)
NONHDL: 84.05
Triglycerides: 86 mg/dL (ref 0.0–149.0)
VLDL: 17.2 mg/dL (ref 0.0–40.0)

## 2015-02-06 LAB — CBC WITH DIFFERENTIAL/PLATELET
BASOS PCT: 0.4 % (ref 0.0–3.0)
Basophils Absolute: 0 10*3/uL (ref 0.0–0.1)
EOS ABS: 0.1 10*3/uL (ref 0.0–0.7)
Eosinophils Relative: 1.7 % (ref 0.0–5.0)
HCT: 38.4 % (ref 36.0–46.0)
Hemoglobin: 12.7 g/dL (ref 12.0–15.0)
LYMPHS ABS: 1.5 10*3/uL (ref 0.7–4.0)
LYMPHS PCT: 21.3 % (ref 12.0–46.0)
MCHC: 33 g/dL (ref 30.0–36.0)
MCV: 95 fl (ref 78.0–100.0)
Monocytes Absolute: 0.7 10*3/uL (ref 0.1–1.0)
Monocytes Relative: 9.5 % (ref 3.0–12.0)
NEUTROS ABS: 4.8 10*3/uL (ref 1.4–7.7)
NEUTROS PCT: 67.1 % (ref 43.0–77.0)
PLATELETS: 230 10*3/uL (ref 150.0–400.0)
RBC: 4.05 Mil/uL (ref 3.87–5.11)
RDW: 13.8 % (ref 11.5–15.5)
WBC: 7.1 10*3/uL (ref 4.0–10.5)

## 2015-02-06 LAB — TSH: TSH: 5.99 u[IU]/mL — AB (ref 0.35–4.50)

## 2015-02-06 LAB — HEMOGLOBIN A1C: HEMOGLOBIN A1C: 6 % (ref 4.6–6.5)

## 2015-02-06 LAB — VITAMIN B12

## 2015-02-11 ENCOUNTER — Ambulatory Visit (INDEPENDENT_AMBULATORY_CARE_PROVIDER_SITE_OTHER): Payer: Medicare Other | Admitting: Family Medicine

## 2015-02-11 ENCOUNTER — Encounter: Payer: Self-pay | Admitting: Family Medicine

## 2015-02-11 VITALS — BP 138/70 | HR 84 | Temp 98.1°F | Ht 64.0 in | Wt 119.5 lb

## 2015-02-11 DIAGNOSIS — Z0000 Encounter for general adult medical examination without abnormal findings: Secondary | ICD-10-CM

## 2015-02-11 DIAGNOSIS — E785 Hyperlipidemia, unspecified: Secondary | ICD-10-CM | POA: Diagnosis not present

## 2015-02-11 DIAGNOSIS — R7989 Other specified abnormal findings of blood chemistry: Secondary | ICD-10-CM

## 2015-02-11 DIAGNOSIS — E538 Deficiency of other specified B group vitamins: Secondary | ICD-10-CM

## 2015-02-11 DIAGNOSIS — M8580 Other specified disorders of bone density and structure, unspecified site: Secondary | ICD-10-CM

## 2015-02-11 DIAGNOSIS — R946 Abnormal results of thyroid function studies: Secondary | ICD-10-CM

## 2015-02-11 DIAGNOSIS — I10 Essential (primary) hypertension: Secondary | ICD-10-CM

## 2015-02-11 DIAGNOSIS — R739 Hyperglycemia, unspecified: Secondary | ICD-10-CM | POA: Diagnosis not present

## 2015-02-11 NOTE — Assessment & Plan Note (Signed)
Level remains high on oral supplementation

## 2015-02-11 NOTE — Assessment & Plan Note (Signed)
bp in fair control at this time  BP Readings from Last 1 Encounters:  02/11/15 138/70   No changes needed Disc lifstyle change with low sodium diet and exercise  Labs reviewed

## 2015-02-11 NOTE — Assessment & Plan Note (Signed)
No clinical changes Re check in 1 mo with free T4

## 2015-02-11 NOTE — Patient Instructions (Signed)
Your blood pressure is better on the 2nd check  Let us know when you want to schedule your bone density test  Continue your current medicines  Your thyroid number is off a bit  (borderline)- I want to re check this in about a month - if it needs treatment we will start you on thyroid medicine  Please make a lab appointment in 1 month  Keep watching your diet for sugar  Labs are stable   I'm glad you are doing well

## 2015-02-11 NOTE — Progress Notes (Signed)
Pre visit review using our clinic review tool, if applicable. No additional management support is needed unless otherwise documented below in the visit note. 

## 2015-02-11 NOTE — Assessment & Plan Note (Signed)
Reviewed health habits including diet and exercise and skin cancer prevention Reviewed appropriate screening tests for age  Also reviewed health mt list, fam hx and immunization status , as well as social and family history   See HPI Labs reviewed Pt will call when ready to schedule her bone density test Disc fall prevention

## 2015-02-11 NOTE — Progress Notes (Signed)
Subjective:    Patient ID: [name], female    DOB: 04/19/1920, 81 y.o.   MRN: [id number]  HPI  Here for annual medicare wellness visit as well as chronic/acute medical problems    I have personally reviewed the Medicare Annual Wellness questionnaire and have noted 1. The patient's medical and social history 2. Their use of alcohol, tobacco or illicit drugs 3. Their current medications and supplements 4. The patient's functional ability including ADL's, fall risks, home safety risks and hearing or visual             impairment. 5. Diet and physical activities 6. Evidence for depression or mood disorders  The patients weight, height, BMI have been recorded in the chart and visual acuity is per eye clinic.  I have made referrals, counseling and provided education to the patient based review of the above and I have provided the pt with a written personalized care plan for preventive services. Reviewed and updated provider list, see scanned forms.  Feeling pretty good  She put a ramp going into her house -is very helpful  Also has a chair -gets outdoors to sit  Uses a walker  No falls at all   See scanned forms.  Routine anticipatory guidance given to patient.  See health maintenance. Colon cancer screening- declines at her age  Breast cancer screening-does not want them at her age  Self breast exam-no lumps  Flu vaccine sept 8th  Tetanus vaccine 4/10 Pneumovax- complete on both vaccines  dexa 5/14 - she has early OP in forearm - may consider scheduling later /when she can get a ride  Last vit D level was 50  Advance directive has a living will and power of attorney  Cognitive function addressed- see scanned forms- and if abnormal then additional documentation follows. -no problems - she still balances her checkbook  Struggles with her hearing - has hearing aides- they only work a little (does the best she can)  PMH and SH reviewed  Meds, vitals, and allergies  reviewed.   ROS: See HPI.  Otherwise negative.    Wt is down 3 lb with bmi of 20 She stays she eats very well/[facility] at the senior center and she cooks also   bp is up on first check  today (says she has whitecoat syndrome ) No cp or palpitations or headaches or edema  No side effects to medicines  BP Readings from Last 3 Encounters:  02/11/15 150/74  06/18/14 132/70  01/21/14 122/64        hx of B12 def/takes oral Lab Results  Component Value Date   VITAMINB12 >1500* 02/06/2015    tsh is up today Pt states she does sleep more- unsure she is tired  Lab Results  Component Value Date   TSH 5.99* 02/06/2015    Hx of hyperglycemia Lab Results  Component Value Date   HGBA1C 6.0 02/06/2015   This is stable/diet controlled  She tries to stay away from sweets   Hx of high lipids Lab Results  Component Value Date   CHOL 136 02/06/2015   CHOL 137 01/14/2014   CHOL 157 07/10/2012   Lab Results  Component Value Date   HDL 51.70 02/06/2015   HDL 42.60 01/14/2014   HDL 55.30 07/10/2012   Lab Results  Component Value Date   LDLCALC 67 02/06/2015   LDLCALC 73 01/14/2014   LDLCALC 77 07/10/2012   Lab Results  Component Value Date   TRIG 86.0  02/06/2015   TRIG 106.0 01/14/2014   TRIG 123.0 07/10/2012   Lab Results  Component Value Date   CHOLHDL 3 02/06/2015   CHOLHDL 3 01/14/2014   CHOLHDL 3 07/10/2012   Lab Results  Component Value Date   LDLDIRECT 70.8 09/05/2009   LDLDIRECT 61.2 03/05/2009   takes mevacor and watches diet   Patient Active Problem List   Diagnosis Date Noted  . Elevated TSH 02/11/2015  . Mobility impaired 06/18/2014  . Dystrophic nail 07/11/2013  . Seborrheic keratoses 07/11/2013  . Callus of foot 01/17/2013  . Encounter for Medicare annual wellness exam 07/10/2012  . Hyperglycemia 01/21/2012  . Leg pain, bilateral 10/15/2011  . Ankle edema 10/15/2011  . B12 deficiency 08/22/2008  . Osteopenia 07/09/2008  . CAROTID ARTERY  STENOSIS 11/14/2006  . Hyperlipidemia LDL goal <130 06/09/2006  . Essential hypertension 06/09/2006  . CONSTIPATION, CHRONIC 06/09/2006  . OSTEOARTHRITIS 06/09/2006   Past Medical History  Diagnosis Date  . HLD (hyperlipidemia) 2002  . HTN (hypertension)   . Osteoarthritis   . Osteopenia     with arm fracture  . Carotid stenosis   . Chronic constipation    Past Surgical History  Procedure Laterality Date  . Colonoscopy  8/02    Diverticulosis  . Dexa  08/2002    Osteopenia  . Total hip arthroplasty  6/06  . Total hip arthroplasty  10/06  . Carotid doppler  11/07    50-79 % RICA  . Carotid doppler      50-69 % RICA, lessa then 50% LICA-overall no change  . Abd us  12/07    Negative AAA  . Dexa  4/10    Osteopenia (worse at forearm T-1.98)-imp at LS -1.0   Social History  Substance Use Topics  . Smoking status: Never Smoker   . Smokeless tobacco: None  . Alcohol Use: No   Family History  Problem Relation Age of Onset  . Heart attack Father   . Mitral valve prolapse Sister    Allergies  Allergen Reactions  . Alendronate Sodium     Leg tingling   . Hctz [Hydrochlorothiazide]     Dehydration/ syncope  . Hydrocodone     Syncope    Current Outpatient Prescriptions on File Prior to Visit  Medication Sig Dispense Refill  . Ascorbic Acid (VITAMIN C PO) Take 1 tablet by mouth daily.    Marland Kitchen. aspirin 81 MG tablet Take 81 mg by mouth daily.      Marland Kitchen. CALCIUM-VITAMIN D PO Take 1 tablet by mouth 2 (two) times daily with a meal.      . diphenhydrAMINE (BENADRYL) 25 mg capsule Take 25 mg by mouth daily as needed.      . docusate sodium (COLACE) 50 MG capsule Take 50-100 mg by mouth as needed.     Marland Kitchen. ibuprofen (ADVIL,MOTRIN) 200 MG tablet Take 200 mg by mouth as needed for pain.    Marland Kitchen. lovastatin (MEVACOR) 20 MG tablet TAKE 1 TABLET BY MOUTH DAILY 30 tablet 11  . Multiple Vitamins-Minerals (PRESERVISION AREDS PO) Take 1 tablet by mouth daily.    Marland Kitchen. olmesartan (BENICAR) 40 MG tablet  TAKE 1 TABLET BY MOUTH DAILY 30 tablet 11  . Omega-3 Fatty Acids (FISH OIL PO) Take 1 capsule by mouth 2 (two) times daily.      . traMADol (ULTRAM) 50 MG tablet TAKE 1 TABLET BY MOUTH UP TO THREE TIMES DAILY AS NEEDED FOR PAIN 90 tablet 5  . vitamin E  400 UNIT capsule Take 400 Units by mouth daily.      . Vitamins-Lipotropics (B-100 COMPLEX) TABS Take 1 tablet by mouth 2 (two) times daily.       No current facility-administered medications on file prior to visit.     Review of Systems Review of Systems  Constitutional: Negative for fever, appetite change, fatigue and unexpected weight change.  Eyes: Negative for pain and visual disturbance.  ENT pos for hearing loss with hearing aides  Respiratory: Negative for cough and shortness of breath.   Cardiovascular: Negative for cp or palpitations    Gastrointestinal: Negative for nausea, diarrhea and constipation.  Genitourinary: Negative for urgency and frequency.  Skin: Negative for pallor or rash   MSK pos for joint stiffness and pain from OA  Neurological: Negative for weakness, light-headedness, numbness and headaches.  Hematological: Negative for adenopathy. Does not bruise/bleed easily.  Psychiatric/Behavioral: Negative for dysphoric mood. The patient is not nervous/anxious.         Objective:   Physical Exam  Constitutional: She appears well-developed and well-nourished. No distress.  Well appearing Very HOH even with hearing aides - difficult at times to communicate with   HENT:  Head: Normocephalic and atraumatic.  Right Ear: External ear normal.  Left Ear: External ear normal.  Mouth/Throat: Oropharynx is clear and moist.  Eyes: Conjunctivae and EOM are normal. Pupils are equal, round, and reactive to light. No scleral icterus.  Neck: Normal range of motion. Neck supple. No JVD present. Carotid bruit is not present. No thyromegaly present.  Cardiovascular: Normal rate, regular rhythm, normal heart sounds and intact distal  pulses.  Exam reveals no gallop.   Pulmonary/Chest: Effort normal and breath sounds normal. No respiratory distress. She has no wheezes. She exhibits no tenderness.  Abdominal: Soft. Bowel sounds are normal. She exhibits no distension, no abdominal bruit and no mass. There is no tenderness.  Genitourinary: No breast swelling, tenderness, discharge or bleeding.  Breast exam: No mass, nodules, thickening, tenderness, bulging, retraction, inflamation, nipple discharge or skin changes noted.  No axillary or clavicular LA.      Musculoskeletal: Normal range of motion. She exhibits no edema or tenderness.  Lymphadenopathy:    She has no cervical adenopathy.  Neurological: She is alert. She has normal reflexes. No cranial nerve deficit. She exhibits normal muscle tone. Coordination normal.  Skin: Skin is warm and dry. No rash noted. No erythema. No pallor.  SKs diffusely  Psychiatric: She has a normal mood and affect.          Assessment & Plan:   Problem List Items Addressed This Visit      Cardiovascular and Mediastinum   Essential hypertension    bp in fair control at this time  BP Readings from Last 1 Encounters:  02/11/15 138/70   No changes needed Disc lifstyle change with low sodium diet and exercise  Labs reviewed         Digestive   B12 deficiency    Level remains high on oral supplementation         Musculoskeletal and Integument   Osteopenia    Due for dexa Disc need for calcium/ vitamin D/ wt bearing exercise and bone density test every 2 y to monitor Disc safety/ fracture risk in detail   She will call when ready to schedule it   No falls or fx         Other   Elevated TSH    No clinical changes Re  check in 1 mo with free T4      Relevant Orders   TSH   T4, Free   Encounter for Medicare annual wellness exam - Primary    Reviewed health habits including diet and exercise and skin cancer prevention Reviewed appropriate screening tests for age  Also  reviewed health mt list, fam hx and immunization status , as well as social and family history   See HPI Labs reviewed Pt will call when ready to schedule her bone density test Disc fall prevention       Hyperglycemia    Lab Results  Component Value Date   HGBA1C 6.0 02/06/2015   This is controlled with diet Disc low glycemic diet to prevent DM      Hyperlipidemia LDL goal <130    Disc goals for lipids and reasons to control them Rev labs with pt Rev low sat fat diet in detail Continue lovastatin and diet

## 2015-02-11 NOTE — Assessment & Plan Note (Signed)
Due for dexa Disc need for calcium/ vitamin D/ wt bearing exercise and bone density test every 2 y to monitor Disc safety/ fracture risk in detail   She will call when ready to schedule it   No falls or fx

## 2015-02-11 NOTE — Assessment & Plan Note (Signed)
Disc goals for lipids and reasons to control them Rev labs with pt Rev low sat fat diet in detail Continue lovastatin and diet  

## 2015-02-11 NOTE — Assessment & Plan Note (Signed)
Lab Results  Component Value Date   HGBA1C 6.0 02/06/2015   This is controlled with diet Disc low glycemic diet to prevent DM

## 2015-02-12 ENCOUNTER — Other Ambulatory Visit: Payer: Self-pay | Admitting: Family Medicine

## 2015-03-13 ENCOUNTER — Other Ambulatory Visit (INDEPENDENT_AMBULATORY_CARE_PROVIDER_SITE_OTHER): Payer: Medicare Other

## 2015-03-13 DIAGNOSIS — R946 Abnormal results of thyroid function studies: Secondary | ICD-10-CM | POA: Diagnosis not present

## 2015-03-13 DIAGNOSIS — R7989 Other specified abnormal findings of blood chemistry: Secondary | ICD-10-CM

## 2015-03-13 LAB — T4, FREE: FREE T4: 0.8 ng/dL (ref 0.60–1.60)

## 2015-03-13 LAB — TSH: TSH: 3.67 u[IU]/mL (ref 0.35–4.50)

## 2015-03-14 ENCOUNTER — Encounter: Payer: Self-pay | Admitting: *Deleted

## 2015-04-23 ENCOUNTER — Encounter: Payer: Self-pay | Admitting: Podiatry

## 2015-04-23 ENCOUNTER — Ambulatory Visit (INDEPENDENT_AMBULATORY_CARE_PROVIDER_SITE_OTHER): Payer: Medicare Other | Admitting: Podiatry

## 2015-04-23 DIAGNOSIS — M79676 Pain in unspecified toe(s): Secondary | ICD-10-CM

## 2015-04-23 DIAGNOSIS — B351 Tinea unguium: Secondary | ICD-10-CM

## 2015-04-23 NOTE — Progress Notes (Signed)
She presents today with a chief complaint of painful elongated toenails.  Objective: vital signs are stable alert and oriented 3. Pulses remain palpable. Toenails are thick yellow dystrophic with mycotic and painful palpation.   assessment: pain in limb secondary to onychomycosis 1 through 5 bilateral.  Plan: debridement of toenails 1 through 5 bilateral cover service secondary to pain.

## 2015-05-29 ENCOUNTER — Telehealth: Payer: Self-pay

## 2015-05-29 NOTE — Telephone Encounter (Signed)
In Dr. Tower's inbox 

## 2015-05-29 NOTE — Telephone Encounter (Signed)
A Disability Placard form was dropped off for [facility]. Please call when ready. Placed in RX Box in front.

## 2015-05-30 NOTE — Telephone Encounter (Signed)
Pt notified form ready for pick up 

## 2015-05-30 NOTE — Telephone Encounter (Signed)
Done and in IN box 

## 2015-07-11 ENCOUNTER — Other Ambulatory Visit: Payer: Self-pay | Admitting: Family Medicine

## 2015-07-14 NOTE — Telephone Encounter (Signed)
Last filled #90 x5 refills.

## 2015-07-14 NOTE — Telephone Encounter (Signed)
Px written for call in   

## 2015-07-15 NOTE — Telephone Encounter (Signed)
Rx called in as prescribed 

## 2015-08-27 ENCOUNTER — Ambulatory Visit: Payer: Medicare Other | Admitting: Podiatry

## 2015-09-08 ENCOUNTER — Ambulatory Visit (INDEPENDENT_AMBULATORY_CARE_PROVIDER_SITE_OTHER): Payer: Medicare Other | Admitting: Podiatry

## 2015-09-08 ENCOUNTER — Encounter: Payer: Self-pay | Admitting: Podiatry

## 2015-09-08 DIAGNOSIS — B351 Tinea unguium: Secondary | ICD-10-CM

## 2015-09-08 DIAGNOSIS — M79676 Pain in unspecified toe(s): Secondary | ICD-10-CM

## 2015-09-08 NOTE — Progress Notes (Signed)
She presents today with chief complaint of painful elongated toenails 1 through 5 bilateral.  Objective: Pulses are palpable. Toenails are thick yellow dystrophic with mycotic painful palpation.  Assessment: Pain in limb secondary onychomycosis.  Plan: Debridement of toenails 1 through 5 bilateral covered service secondary to pain and thickness.

## 2015-10-14 DIAGNOSIS — I6523 Occlusion and stenosis of bilateral carotid arteries: Secondary | ICD-10-CM | POA: Diagnosis not present

## 2015-10-14 DIAGNOSIS — I739 Peripheral vascular disease, unspecified: Secondary | ICD-10-CM | POA: Diagnosis not present

## 2015-10-14 DIAGNOSIS — I70219 Atherosclerosis of native arteries of extremities with intermittent claudication, unspecified extremity: Secondary | ICD-10-CM | POA: Diagnosis not present

## 2015-10-14 DIAGNOSIS — I10 Essential (primary) hypertension: Secondary | ICD-10-CM | POA: Diagnosis not present

## 2015-10-14 DIAGNOSIS — E785 Hyperlipidemia, unspecified: Secondary | ICD-10-CM | POA: Diagnosis not present

## 2015-10-21 DIAGNOSIS — H02105 Unspecified ectropion of left lower eyelid: Secondary | ICD-10-CM | POA: Diagnosis not present

## 2015-10-21 DIAGNOSIS — H31091 Other chorioretinal scars, right eye: Secondary | ICD-10-CM | POA: Diagnosis not present

## 2015-10-21 DIAGNOSIS — H353131 Nonexudative age-related macular degeneration, bilateral, early dry stage: Secondary | ICD-10-CM | POA: Diagnosis not present

## 2015-10-21 DIAGNOSIS — Z961 Presence of intraocular lens: Secondary | ICD-10-CM | POA: Diagnosis not present

## 2015-10-21 DIAGNOSIS — H40013 Open angle with borderline findings, low risk, bilateral: Secondary | ICD-10-CM | POA: Diagnosis not present

## 2015-12-17 DIAGNOSIS — Z23 Encounter for immunization: Secondary | ICD-10-CM | POA: Diagnosis not present

## 2016-01-12 ENCOUNTER — Encounter: Payer: Self-pay | Admitting: Podiatry

## 2016-01-12 ENCOUNTER — Ambulatory Visit (INDEPENDENT_AMBULATORY_CARE_PROVIDER_SITE_OTHER): Payer: Medicare Other | Admitting: Podiatry

## 2016-01-12 DIAGNOSIS — M79676 Pain in unspecified toe(s): Secondary | ICD-10-CM

## 2016-01-12 DIAGNOSIS — B351 Tinea unguium: Secondary | ICD-10-CM | POA: Diagnosis not present

## 2016-01-12 NOTE — Progress Notes (Signed)
She presents today with chief complaint of painful elongated toenails.  Objective: Toenails are thick yellow dystrophic with mycotic and painful palpation.  Assessment: Pain and limp secondary to onychomycosis.  Plan: Debridement of toenails 1 through 5 bilateral.

## 2016-01-19 ENCOUNTER — Other Ambulatory Visit: Payer: Self-pay | Admitting: Family Medicine

## 2016-01-20 NOTE — Telephone Encounter (Signed)
Rx called in as prescribed, called home # and no answer it just kept ringing

## 2016-01-20 NOTE — Telephone Encounter (Signed)
Last OV was 02/01/15 and no future appts., the tramadol was last filled on 07/14/15 #90 with 5 additional refills, please advise

## 2016-01-20 NOTE — Telephone Encounter (Signed)
Refilled meds for a month  Please call in tramadol  Schedule 30 min f/u when able please

## 2016-01-29 ENCOUNTER — Encounter: Payer: Self-pay | Admitting: *Deleted

## 2016-01-29 NOTE — Telephone Encounter (Signed)
Called again and no answer so letter mailed letting pt know she needs to schedule a 30 min f/u appt

## 2016-02-16 ENCOUNTER — Ambulatory Visit (INDEPENDENT_AMBULATORY_CARE_PROVIDER_SITE_OTHER): Payer: Medicare Other

## 2016-02-16 VITALS — BP 138/70 | HR 81 | Temp 99.1°F | Ht 64.0 in | Wt 112.5 lb

## 2016-02-16 DIAGNOSIS — E785 Hyperlipidemia, unspecified: Secondary | ICD-10-CM | POA: Diagnosis not present

## 2016-02-16 DIAGNOSIS — Z0000 Encounter for general adult medical examination without abnormal findings: Secondary | ICD-10-CM | POA: Diagnosis not present

## 2016-02-16 DIAGNOSIS — R946 Abnormal results of thyroid function studies: Secondary | ICD-10-CM

## 2016-02-16 DIAGNOSIS — E538 Deficiency of other specified B group vitamins: Secondary | ICD-10-CM

## 2016-02-16 DIAGNOSIS — I10 Essential (primary) hypertension: Secondary | ICD-10-CM | POA: Diagnosis not present

## 2016-02-16 DIAGNOSIS — R739 Hyperglycemia, unspecified: Secondary | ICD-10-CM

## 2016-02-16 DIAGNOSIS — R7989 Other specified abnormal findings of blood chemistry: Secondary | ICD-10-CM

## 2016-02-16 LAB — CBC WITH DIFFERENTIAL/PLATELET
BASOS ABS: 0 10*3/uL (ref 0.0–0.1)
Basophils Relative: 0.3 % (ref 0.0–3.0)
EOS ABS: 0.1 10*3/uL (ref 0.0–0.7)
Eosinophils Relative: 1 % (ref 0.0–5.0)
HEMATOCRIT: 39.2 % (ref 36.0–46.0)
HEMOGLOBIN: 12.9 g/dL (ref 12.0–15.0)
LYMPHS PCT: 22.7 % (ref 12.0–46.0)
Lymphs Abs: 1.9 10*3/uL (ref 0.7–4.0)
MCHC: 32.9 g/dL (ref 30.0–36.0)
MCV: 95.5 fl (ref 78.0–100.0)
MONOS PCT: 8.2 % (ref 3.0–12.0)
Monocytes Absolute: 0.7 10*3/uL (ref 0.1–1.0)
Neutro Abs: 5.7 10*3/uL (ref 1.4–7.7)
Neutrophils Relative %: 67.8 % (ref 43.0–77.0)
PLATELETS: 231 10*3/uL (ref 150.0–400.0)
RBC: 4.11 Mil/uL (ref 3.87–5.11)
RDW: 14.2 % (ref 11.5–15.5)
WBC: 8.4 10*3/uL (ref 4.0–10.5)

## 2016-02-16 LAB — LIPID PANEL
CHOL/HDL RATIO: 2
Cholesterol: 135 mg/dL (ref 0–200)
HDL: 56 mg/dL (ref >39.00)
LDL Cholesterol: 62 mg/dL (ref 0–99)
NONHDL: 78.66
TRIGLYCERIDES: 83 mg/dL (ref 0.0–149.0)
VLDL: 16.6 mg/dL (ref 0.0–40.0)

## 2016-02-16 LAB — COMPREHENSIVE METABOLIC PANEL
ALBUMIN: 4.2 g/dL (ref 3.5–5.2)
ALK PHOS: 56 U/L (ref 39–117)
ALT: 9 U/L (ref 0–35)
AST: 15 U/L (ref 0–37)
BILIRUBIN TOTAL: 0.4 mg/dL (ref 0.2–1.2)
BUN: 25 mg/dL — ABNORMAL HIGH (ref 6–23)
CALCIUM: 10.1 mg/dL (ref 8.4–10.5)
CO2: 27 meq/L (ref 19–32)
CREATININE: 0.87 mg/dL (ref 0.40–1.20)
Chloride: 105 mEq/L (ref 96–112)
GFR: 64.48 mL/min (ref >60.00)
Glucose, Bld: 99 mg/dL (ref 70–99)
Potassium: 4.1 mEq/L (ref 3.5–5.1)
Sodium: 141 mEq/L (ref 135–145)
Total Protein: 6.8 g/dL (ref 6.0–8.3)

## 2016-02-16 LAB — HEMOGLOBIN A1C: Hgb A1c MFr Bld: 5.8 % (ref 4.6–6.5)

## 2016-02-16 LAB — VITAMIN B12

## 2016-02-16 LAB — TSH: TSH: 4.54 u[IU]/mL — AB (ref 0.35–4.50)

## 2016-02-16 NOTE — Patient Instructions (Signed)
[name] , Thank you for taking time to come for your Medicare Wellness Visit. I appreciate your ongoing commitment to your health goals. Please review the following plan we discussed and let me know if I can assist you in the future.   These are the goals we discussed: Goals    . Increase physical activity          Starting 02/16/16, I will continue to stretch at least 30 min daily.        This is a list of the screening recommended for you and due dates:  Health Maintenance  Topic Date Due  . Tetanus Vaccine  07/10/2018  . Flu Shot  Addressed  . DEXA scan (bone density measurement)  Completed  . Shingles Vaccine  Completed  . Pneumonia vaccines  Completed   Preventive Care for Adults  A healthy lifestyle and preventive care can promote health and wellness. Preventive health guidelines for adults include the following key practices.  . A routine yearly physical is a good way to check with your health care provider about your health and preventive screening. It is a chance to share any concerns and updates on your health and to receive a thorough exam.  . Visit your dentist for a routine exam and preventive care every 6 months. Brush your teeth twice a day and floss once a day. Good oral hygiene prevents tooth decay and gum disease.  . The frequency of eye exams is based on your age, health, family medical history, use  of contact lenses, and other factors. Follow your health care provider's ecommendations for frequency of eye exams.  . Eat a healthy diet. Foods like vegetables, fruits, whole grains, low-fat dairy products, and lean protein foods contain the nutrients you need without too many calories. Decrease your intake of foods high in solid fats, added sugars, and salt. Eat the right amount of calories for you. Get information about a proper diet from your health care provider, if necessary.  . Regular physical exercise is one of the most important things you can do for  your health. Most adults should get at least 150 minutes of moderate-intensity exercise (any activity that increases your heart rate and causes you to sweat) each week. In addition, most adults need muscle-strengthening exercises on 2 or more days a week.  Silver Sneakers may be a benefit available to you. To determine eligibility, you may visit the website: www.silversneakers.com or contact program at [phone] Mon-Fri between 8AM-8PM.   . Maintain a healthy weight. The body mass index (BMI) is a screening tool to identify possible weight problems. It provides an estimate of body fat based on height and weight. Your health care provider can find your BMI and can help you achieve or maintain a healthy weight.   For adults 20 years and older: ? A BMI below 18.5 is considered underweight. ? A BMI of 18.5 to 24.9 is normal. ? A BMI of 25 to 29.9 is considered overweight. ? A BMI of 30 and above is considered obese.   . Maintain normal blood lipids and cholesterol levels by exercising and minimizing your intake of saturated fat. Eat a balanced diet with plenty of fruit and vegetables. Blood tests for lipids and cholesterol should begin at age 80 and be repeated every 5 years. If your lipid or cholesterol levels are high, you are over 50, or you are at high risk for heart disease, you may need your cholesterol levels checked more frequently. Ongoing  high lipid and cholesterol levels should be treated with medicines if diet and exercise are not working.  . If you smoke, find out from your health care provider how to quit. If you do not use tobacco, please do not start.  . If you choose to drink alcohol, please do not consume more than 2 drinks per day. One drink is considered to be 12 ounces (355 mL) of beer, 5 ounces (148 mL) of wine, or 1.5 ounces (44 mL) of liquor.  . If you are 39-17 years old, ask your health care provider if you should take aspirin to prevent strokes.  . Use sunscreen.  Apply sunscreen liberally and repeatedly throughout the day. You should seek shade when your shadow is shorter than you. Protect yourself by wearing long sleeves, pants, a wide-brimmed hat, and sunglasses year round, whenever you are outdoors.  . Once a month, do a whole body skin exam, using a mirror to look at the skin on your back. Tell your health care provider of new moles, moles that have irregular borders, moles that are larger than a pencil eraser, or moles that have changed in shape or color.

## 2016-02-16 NOTE — Progress Notes (Signed)
Pre visit review using our clinic review tool, if applicable. No additional management support is needed unless otherwise documented below in the visit note. 

## 2016-02-16 NOTE — Progress Notes (Signed)
PCP notes:   Health maintenance:  No gaps identified. Health maintenance schedule reviewed.   Abnormal screenings:   Mini-Cog score: 19/20  Patient concerns:   None  Nurse concerns:  None  Next PCP appt:   03/01/16 @ 1400  I reviewed health advisor's note, was available for consultation, and agree with documentation and plan.  Roxy MannsMarne Tower MD

## 2016-02-16 NOTE — Progress Notes (Signed)
Subjective:   [name] is a 82 y.o. female who presents for Medicare Annual (Subsequent) preventive examination.  Review of Systems:  N/A Cardiac Risk Factors include: advanced age (>5955men, 71>65 women);dyslipidemia;hypertension     Objective:     Vitals: BP 138/70 (BP Location: Left Arm, Patient Position: Sitting, Cuff Size: Normal)   Pulse 81   Temp 99.1 F (37.3 C) (Oral)   Ht 5\' 4"  (1.626 m) Comment: no shoes  Wt 112 lb 8 oz (51 kg)   SpO2 97%   BMI 19.31 kg/m   Body mass index is 19.31 kg/m.   Tobacco History  Smoking Status  . Never Smoker  Smokeless Tobacco  . Not on file     Counseling given: No   Past Medical History:  Diagnosis Date  . Carotid stenosis   . Chronic constipation   . HLD (hyperlipidemia) 2002  . HTN (hypertension)   . Osteoarthritis   . Osteopenia    with arm fracture   Past Surgical History:  Procedure Laterality Date  . Abd US  12/07   Negative AAA  . carotid doppler  11/07   50-79 % RICA  . carotid doppler     50-69 % RICA, lessa then 50% LICA-overall no change  . COLONOSCOPY  8/02   Diverticulosis  . DEXA  08/2002   Osteopenia  . DEXA  4/10   Osteopenia (worse at forearm T-1.98)-imp at LS -1.0  . TOTAL HIP ARTHROPLASTY  6/06  . TOTAL HIP ARTHROPLASTY  10/06   Family History  Problem Relation Age of Onset  . Heart attack Father   . Mitral valve prolapse Sister    History  Sexual Activity  . Sexual activity: No    Outpatient Encounter Prescriptions as of 02/16/2016  Medication Sig  . Ascorbic Acid (VITAMIN C PO) Take 1 tablet by mouth daily.  Marland Kitchen. aspirin 81 MG tablet Take 81 mg by mouth daily.    Marland Kitchen. CALCIUM-VITAMIN D PO Take 1 tablet by mouth 2 (two) times daily with a meal.    . diphenhydrAMINE (BENADRYL) 25 mg capsule Take 25 mg by mouth daily as needed.    . docusate sodium (COLACE) 50 MG capsule Take 50-100 mg by mouth as needed.   Marland Kitchen. ibuprofen (ADVIL,MOTRIN) 200 MG tablet Take 200 mg by mouth as needed  for pain.  Marland Kitchen. lovastatin (MEVACOR) 20 MG tablet TAKE 1 TABLET BY MOUTH EVERY DAY  . Multiple Vitamins-Minerals (PRESERVISION AREDS PO) Take 1 tablet by mouth daily.  Marland Kitchen. olmesartan (BENICAR) 40 MG tablet TAKE 1 TABLET BY MOUTH EVERY DAY  . Omega-3 Fatty Acids (FISH OIL PO) Take 1 capsule by mouth 2 (two) times daily.    . traMADol (ULTRAM) 50 MG tablet TAKE 1 TABLET BY MOUTH UP TO THREE TIMES DAILY AS NEEDED  . vitamin E 400 UNIT capsule Take 400 Units by mouth daily.    . Vitamins-Lipotropics (B-100 COMPLEX) TABS Take 1 tablet by mouth 2 (two) times daily.     No facility-administered encounter medications on file as of 02/16/2016.     Activities of Daily Living In your present state of health, do you have any difficulty performing the following activities: 02/16/2016  Hearing? Y  Vision? N  Difficulty concentrating or making decisions? N  Walking or climbing stairs? Y  Dressing or bathing? N  Doing errands, shopping? N  Preparing Food and eating ? N  Using the Toilet? N  In the past six months, have you  accidently leaked urine? N  Do you have problems with loss of bowel control? N  Managing your Medications? N  Managing your Finances? N  Housekeeping or managing your Housekeeping? N  Some recent data might be hidden    Patient Care Team: Judy PimpleMarne A Tower, MD as PCP - General Ernesto Rutherfordobert Groat, MD as Consulting Physician (Ophthalmology)    Assessment:    Hearing Screening Comments: Wears bilateral hearing aids Vision Screening Comments: Last vision exam in June 2017 with Dr. Dione BoozeGroat  Exercise Activities and Dietary recommendations Current Exercise Habits: Home exercise routine, Type of exercise: stretching, Time (Minutes): 30, Frequency (Times/Week): 6, Weekly Exercise (Minutes/Week): 180, Intensity: Mild, Exercise limited by: None identified  Goals    . Increase physical activity          Starting 02/16/16, I will continue to stretch at least 30 min daily.       Fall Risk Fall  Risk  02/16/2016 02/11/2015 07/25/2012 01/21/2012  Falls in the past year? No No No -  Risk for fall due to : - - - Impaired mobility  Risk for fall due to (comments): - - - using a cane / no rugs   Depression Screen PHQ 2/9 Scores 02/16/2016 02/11/2015 07/25/2012 01/21/2012  PHQ - 2 Score 0 0 0 0     Cognitive Function MMSE - Mini Mental State Exam 02/16/2016  Orientation to time 5  Orientation to Place 5  Registration 3  Attention/ Calculation 0  Recall 2  Recall-comments pt was unable to recall 1 of 3 words  Language- name 2 objects 0  Language- repeat 1  Language- follow 3 step command 3  Language- read & follow direction 0  Write a sentence 0  Copy design 0  Total score 19     PLEASE NOTE: A Mini-Cog screen was completed. Maximum score is 20. A value of 0 denotes this part of Folstein MMSE was not completed or the patient failed this part of the Mini-Cog screening.   Mini-Cog Screening Orientation to Time - Max 5 pts Orientation to Place - Max 5 pts Registration - Max 3 pts Recall - Max 3 pts Language Repeat - Max 1 pts Language Follow 3 Step Command - Max 3 pts     Immunization History  Administered Date(s) Administered  . Influenza Split 12/23/2010  . Influenza Whole 11/27/2005, 12/29/2006, 11/27/2008, 12/09/2009, 12/30/2011  . Influenza, High Dose Seasonal PF 12/12/2013, 12/17/2015  . Influenza-Unspecified 12/14/2012, 12/05/2014  . Pneumococcal Conjugate-13 01/21/2014  . Pneumococcal Polysaccharide-23 03/29/1994  . Td 02/28/1999, 07/09/2008   Screening Tests Health Maintenance  Topic Date Due  . TETANUS/TDAP  07/10/2018  . INFLUENZA VACCINE  Addressed  . DEXA SCAN  Completed  . ZOSTAVAX  Completed  . PNA vac Low Risk Adult  Completed      Plan:     I have personally reviewed and addressed the Medicare Annual Wellness questionnaire and have noted the following in the patient's chart:  A. Medical and social history B. Use of alcohol, tobacco or  illicit drugs  C. Current medications and supplements D. Functional ability and status E.  Nutritional status F.  Physical activity G. Advance directives H. List of other physicians I.  Hospitalizations, surgeries, and ER visits in previous 12 months J.  Vitals K. Screenings to include hearing, vision, cognitive, depression L. Referrals and appointments - none  In addition, I have reviewed and discussed with patient certain preventive protocols, quality metrics, and best practice recommendations. A written  personalized care plan for preventive services as well as general preventive health recommendations were provided to patient.  See attached scanned questionnaire for additional information.   Signed,   Lindell Noe, MHA, BS, LPN Health Coach

## 2016-02-19 ENCOUNTER — Other Ambulatory Visit: Payer: Self-pay | Admitting: Family Medicine

## 2016-02-20 ENCOUNTER — Other Ambulatory Visit: Payer: Self-pay | Admitting: Family Medicine

## 2016-02-23 NOTE — Telephone Encounter (Signed)
Rx called in as prescribed 

## 2016-02-23 NOTE — Telephone Encounter (Signed)
Pt has CPE scheduled for 03/01/16, Tramadol last filled on 01/20/16 #90 with 0 refills, please advise

## 2016-02-23 NOTE — Telephone Encounter (Signed)
Px written for call in   

## 2016-02-24 ENCOUNTER — Telehealth: Payer: Self-pay | Admitting: Family Medicine

## 2016-02-24 NOTE — Telephone Encounter (Signed)
Note done and in IN box 

## 2016-02-24 NOTE — Telephone Encounter (Signed)
Kathie RhodesBetty carter came in today stating pt is changing her insurance to [employer].  They want a letter dated and signed stating the kind of arthritis pt has and the severity of the arthritis.  This needs to be faxed to Oneta RackZack Whitaker @ [phone] Letter needs to be address to [facility]  Please let betty know when this has been done

## 2016-02-25 NOTE — Telephone Encounter (Signed)
Letter faxed and Celene SkeenBetty Carter notified

## 2016-03-01 ENCOUNTER — Encounter: Payer: Self-pay | Admitting: Family Medicine

## 2016-03-01 ENCOUNTER — Ambulatory Visit (INDEPENDENT_AMBULATORY_CARE_PROVIDER_SITE_OTHER): Payer: Medicare Other | Admitting: Family Medicine

## 2016-03-01 VITALS — BP 126/68 | HR 86 | Temp 98.5°F | Ht 64.0 in | Wt 114.8 lb

## 2016-03-01 DIAGNOSIS — R739 Hyperglycemia, unspecified: Secondary | ICD-10-CM

## 2016-03-01 DIAGNOSIS — E538 Deficiency of other specified B group vitamins: Secondary | ICD-10-CM | POA: Diagnosis not present

## 2016-03-01 DIAGNOSIS — R7989 Other specified abnormal findings of blood chemistry: Secondary | ICD-10-CM

## 2016-03-01 DIAGNOSIS — M85839 Other specified disorders of bone density and structure, unspecified forearm: Secondary | ICD-10-CM

## 2016-03-01 DIAGNOSIS — E785 Hyperlipidemia, unspecified: Secondary | ICD-10-CM

## 2016-03-01 DIAGNOSIS — I10 Essential (primary) hypertension: Secondary | ICD-10-CM

## 2016-03-01 DIAGNOSIS — R946 Abnormal results of thyroid function studies: Secondary | ICD-10-CM

## 2016-03-01 MED ORDER — OLMESARTAN MEDOXOMIL 40 MG PO TABS: 40.0000 mg | ORAL_TABLET | Freq: Every day | ORAL | 11 refills | Status: AC

## 2016-03-01 MED ORDER — LOVASTATIN 20 MG PO TABS: 20.0000 mg | ORAL_TABLET | Freq: Every day | ORAL | 11 refills | Status: AC

## 2016-03-01 NOTE — Patient Instructions (Addendum)
When you know your schedule and transportation - call us and we can schedule your bone density test  Decrease your vitamin B12 to one pill daily  Take care of yourself   Keep going to the center for your meals and make sure you drink your water

## 2016-03-01 NOTE — Assessment & Plan Note (Signed)
Subclinical  Lab Results  Component Value Date   TSH 4.54 (H) 02/16/2016    Will continue to follow Mild elevation and no symptoms at this time Last FT4 was normal

## 2016-03-01 NOTE — Assessment & Plan Note (Signed)
bp in fair control at this time  BP Readings from Last 1 Encounters:  03/01/16 126/68   No changes needed Disc lifstyle change with low sodium diet and exercise  Labs reviewed

## 2016-03-01 NOTE — Progress Notes (Signed)
Pre visit review using our clinic review tool, if applicable. No additional management support is needed unless otherwise documented below in the visit note. 

## 2016-03-01 NOTE — Assessment & Plan Note (Signed)
Disc goals for lipids and reasons to control them Rev labs with pt Rev low sat fat diet in detail Well controlled with lovastatin and diet

## 2016-03-01 NOTE — Assessment & Plan Note (Signed)
Lab Results  Component Value Date   HGBA1C 5.8 02/16/2016   This is improved with lower glycemic diet  Wt is stable Enc her to continue this and stay active

## 2016-03-01 NOTE — Assessment & Plan Note (Signed)
Level is high  Will cut back to one B12 pill (from 2) orally daily  Eats a balanced diet

## 2016-03-01 NOTE — Assessment & Plan Note (Signed)
Pt is due for dexa- she will call back to schedule when she knows she has transportation No more falls now that she uses a walker full time No fractures  On ca and D  Disc need for calcium/ vitamin D/ wt bearing exercise and bone density test every 2 y to monitor Disc safety/ fracture risk in detail

## 2016-03-01 NOTE — Progress Notes (Signed)
Subjective:    Patient ID: [name], female    DOB: 09/16/1920, 82 y.o.   MRN: [id number]  HPI Here for annual f/u of chronic medical problems   Wt Readings from Last 3 Encounters:  03/01/16 114 lb 12 oz (52.1 kg)  02/16/16 112 lb 8 oz (51 kg)  02/11/15 119 lb 8 oz (54.2 kg)     Feeling pretty good  Sleep is interrupted frequently- she takes a nap also  [facility] - is able to socialize  She drives very little - just to the center and that is it   Hearing is very bad- will get new hearing aides after xmas- looking forward to it   amw was 11/20 Mini cog score 19/20  (missed one recall) Did well for her age  No falls now that she uses a walker full time   Declines mammogram due to age No lumps on self exam   utd imms  dexa 5/14-early OP in forearm  No falls or fractures at all  Takes her ca and D for bone health Will call back to schedule next dexa - when she knows someone could drive her    Colonoscopy 0/868/02 No further screening due to age   bp is stable today  No cp or palpitations or headaches or edema  No side effects to medicines  BP Readings from Last 3 Encounters:  03/01/16 126/68  02/16/16 138/70  02/11/15 138/70     Hx of b12 def Lab Results  Component Value Date   VITAMINB12 >1500 (H) 02/16/2016  she takes 2 pills daily -will cut it back   Hx of hyperlipidemia Lab Results  Component Value Date   CHOL 135 02/16/2016   CHOL 136 02/06/2015   CHOL 137 01/14/2014   Lab Results  Component Value Date   HDL 56.00 02/16/2016   HDL 51.70 02/06/2015   HDL 42.60 01/14/2014   Lab Results  Component Value Date   LDLCALC 62 02/16/2016   LDLCALC 67 02/06/2015   LDLCALC 73 01/14/2014   Lab Results  Component Value Date   TRIG 83.0 02/16/2016   TRIG 86.0 02/06/2015   TRIG 106.0 01/14/2014   Lab Results  Component Value Date   CHOLHDL 2 02/16/2016   CHOLHDL 3 02/06/2015   CHOLHDL 3 01/14/2014   Lab Results    Component Value Date   LDLDIRECT 70.8 09/05/2009   LDLDIRECT 61.2 03/05/2009     Takes lovastatin Good diet- more vegetables now    Hx of hyperglycemia Lab Results  Component Value Date   HGBA1C 5.8 02/16/2016  improved from 6.0  She has been less pies   Hx of elevated tsh Lab Results  Component Value Date   TSH 4.54 (H) 02/16/2016   no fatigue or sluggishness No skin or hair changes Last free T4 nl  Sub clinical- watching    Patient Active Problem List   Diagnosis Date Noted  . Elevated TSH 02/11/2015  . Mobility impaired 06/18/2014  . Dystrophic nail 07/11/2013  . Seborrheic keratoses 07/11/2013  . Callus of foot 01/17/2013  . Encounter for Medicare annual wellness exam 07/10/2012  . Hyperglycemia 01/21/2012  . Leg pain, bilateral 10/15/2011  . Ankle edema 10/15/2011  . B12 deficiency 08/22/2008  . Osteopenia 07/09/2008  . CAROTID ARTERY STENOSIS 11/14/2006  . Hyperlipidemia LDL goal <130 06/09/2006  . Essential hypertension 06/09/2006  . CONSTIPATION, CHRONIC 06/09/2006  . OSTEOARTHRITIS 06/09/2006   Past Medical  History:  Diagnosis Date  . Carotid stenosis   . Chronic constipation   . HLD (hyperlipidemia) 2002  . HTN (hypertension)   . Osteoarthritis   . Osteopenia    with arm fracture   Past Surgical History:  Procedure Laterality Date  . Abd [place]  12/07   Negative AAA  . carotid doppler  11/07   50-79 % RICA  . carotid doppler     50-69 % RICA, lessa then 50% LICA-overall no change  . COLONOSCOPY  8/02   Diverticulosis  . DEXA  08/2002   Osteopenia  . DEXA  4/10   Osteopenia (worse at forearm T-1.98)-imp at LS -1.0  . TOTAL HIP ARTHROPLASTY  6/06  . TOTAL HIP ARTHROPLASTY  10/06   Social History  Substance Use Topics  . Smoking status: Never Smoker  . Smokeless tobacco: Not on file  . Alcohol use No   Family History  Problem Relation Age of Onset  . Heart attack Father   . Mitral valve prolapse Sister    Allergies  Allergen  Reactions  . Alendronate Sodium     Leg tingling   . Hctz [Hydrochlorothiazide]     Dehydration/ syncope  . Hydrocodone     Syncope    Current Outpatient Prescriptions on File Prior to Visit  Medication Sig Dispense Refill  . Ascorbic Acid (VITAMIN C PO) Take 1 tablet by mouth daily.    Marland Kitchen aspirin 81 MG tablet Take 81 mg by mouth daily.      Marland Kitchen CALCIUM-VITAMIN D PO Take 1 tablet by mouth 2 (two) times daily with a meal.      . diphenhydrAMINE (BENADRYL) 25 mg capsule Take 25 mg by mouth daily as needed.      . docusate sodium (COLACE) 50 MG capsule Take 50-100 mg by mouth as needed.     Marland Kitchen ibuprofen (ADVIL,MOTRIN) 200 MG tablet Take 200 mg by mouth as needed for pain.    . Multiple Vitamins-Minerals (PRESERVISION AREDS PO) Take 1 tablet by mouth daily.    . Omega-3 Fatty Acids (FISH OIL PO) Take 1 capsule by mouth 2 (two) times daily.      . traMADol (ULTRAM) 50 MG tablet TAKE 1 TABLET BY MOUTH UP TO THREE TIMES DAILY AS NEEDED 90 tablet 0  . vitamin E 400 UNIT capsule Take 400 Units by mouth daily.      . Vitamins-Lipotropics (B-100 COMPLEX) TABS Take 1 tablet by mouth 2 (two) times daily.       No current facility-administered medications on file prior to visit.     Review of Systems Review of Systems  Constitutional: Negative for fever, appetite change, fatigue and unexpected weight change.  Eyes: Negative for pain and visual disturbance.  ENt pos for hearing loss that has worsened  Respiratory: Negative for cough and shortness of breath.   Cardiovascular: Negative for cp or palpitations    Gastrointestinal: Negative for nausea, diarrhea and constipation.  Genitourinary: Negative for urgency and frequency.  Skin: Negative for pallor or rash   Neurological: Negative for weakness, light-headedness, numbness and headaches.  Hematological: Negative for adenopathy. Does not bruise/bleed easily.  Psychiatric/Behavioral: Negative for dysphoric mood. The patient is not nervous/anxious.           Objective:   Physical Exam  Constitutional: She appears well-developed and well-nourished. No distress.  Well appearing elderly female - uses walker to ambulate Very HOH  HENT:  Head: Normocephalic and atraumatic.  Nose: Nose normal.  Mouth/Throat: Oropharynx is clear and moist.  Very HOH Is getting new hearing aides after xmas (per pt)- she does have current ones in  Eyes: Conjunctivae and EOM are normal. Pupils are equal, round, and reactive to light. Right eye exhibits no discharge. Left eye exhibits no discharge. No scleral icterus.  Neck: Normal range of motion. Neck supple. No JVD present. Carotid bruit is not present. No thyromegaly present.  Cardiovascular: Normal rate, regular rhythm, normal heart sounds and intact distal pulses.  Exam reveals no gallop.   Pulmonary/Chest: Effort normal and breath sounds normal. No respiratory distress. She has no wheezes. She has no rales.  Abdominal: Soft. Bowel sounds are normal. She exhibits no distension and no mass. There is no tenderness.  Genitourinary:  Genitourinary Comments: Declines breast cancer screening due to age  Musculoskeletal: She exhibits no edema or tenderness.  Mild kyphosis   Lymphadenopathy:    She has no cervical adenopathy.  Neurological: She is alert. She has normal reflexes. No cranial nerve deficit. She exhibits normal muscle tone. Coordination normal.  R foot turns in with walking- but she is steady with a walker   Skin: Skin is warm and dry. No rash noted. No erythema. No pallor.  Scattered lentigines and sks   Psychiatric: She has a normal mood and affect.  Pleasant and talkative and mentally sharp today          Assessment & Plan:   Problem List Items Addressed This Visit      Cardiovascular and Mediastinum   Essential hypertension - Primary    bp in fair control at this time  BP Readings from Last 1 Encounters:  03/01/16 126/68   No changes needed Disc lifstyle change with low sodium  diet and exercise  Labs reviewed       Relevant Medications   olmesartan (BENICAR) 40 MG tablet   lovastatin (MEVACOR) 20 MG tablet     Musculoskeletal and Integument   Osteopenia    Pt is due for dexa- she will call back to schedule when she knows she has transportation No more falls now that she uses a walker full time No fractures  On ca and D  Disc need for calcium/ vitamin D/ wt bearing exercise and bone density test every 2 y to monitor Disc safety/ fracture risk in detail          Other   Hyperlipidemia LDL goal <130    Disc goals for lipids and reasons to control them Rev labs with pt Rev low sat fat diet in detail Well controlled with lovastatin and diet       Relevant Medications   olmesartan (BENICAR) 40 MG tablet   lovastatin (MEVACOR) 20 MG tablet   Hyperglycemia    Lab Results  Component Value Date   HGBA1C 5.8 02/16/2016   This is improved with lower glycemic diet  Wt is stable Enc her to continue this and stay active       Elevated TSH    Subclinical  Lab Results  Component Value Date   TSH 4.54 (H) 02/16/2016    Will continue to follow Mild elevation and no symptoms at this time Last FT4 was normal       B12 deficiency    Level is high  Will cut back to one B12 pill (from 2) orally daily  Eats a balanced diet

## 2016-03-25 ENCOUNTER — Other Ambulatory Visit: Payer: Self-pay | Admitting: Family Medicine

## 2016-03-25 NOTE — Telephone Encounter (Signed)
Px written for call in   

## 2016-03-25 NOTE — Telephone Encounter (Signed)
Last CPE was 03/01/16, last filled on 02/23/16 #90 with 0 refills, please advise

## 2016-03-25 NOTE — Telephone Encounter (Signed)
Rx called in as prescribed 

## 2016-03-27 ENCOUNTER — Other Ambulatory Visit: Payer: Self-pay | Admitting: Family Medicine

## 2016-04-26 ENCOUNTER — Other Ambulatory Visit: Payer: Self-pay | Admitting: Family Medicine

## 2016-04-26 NOTE — Telephone Encounter (Signed)
Px written for call in   

## 2016-04-26 NOTE — Telephone Encounter (Signed)
Rx called in as prescribed 

## 2016-04-26 NOTE — Telephone Encounter (Signed)
Pt had CPE on 03/01/16, last filled on 03/25/16 #90 tabs with 0 refills, please advise

## 2016-05-17 ENCOUNTER — Ambulatory Visit: Payer: Medicare Other | Admitting: Podiatry

## 2016-05-19 ENCOUNTER — Ambulatory Visit (INDEPENDENT_AMBULATORY_CARE_PROVIDER_SITE_OTHER): Payer: Medicare Other | Admitting: Podiatry

## 2016-05-19 ENCOUNTER — Encounter: Payer: Self-pay | Admitting: Podiatry

## 2016-05-19 DIAGNOSIS — M79676 Pain in unspecified toe(s): Secondary | ICD-10-CM | POA: Diagnosis not present

## 2016-05-19 DIAGNOSIS — B351 Tinea unguium: Secondary | ICD-10-CM | POA: Diagnosis not present

## 2016-05-19 NOTE — Progress Notes (Signed)
She presents today for chief complaint of painful elongated toenails.  Objective: Pulses are palpable nails are thick yellow dystrophic lytic mycotic and painful.  Assessment: Pain in limb secondary to onychomycosis.  Plan: Debridement of toenails 1 through 5 bilateral.

## 2016-05-28 ENCOUNTER — Other Ambulatory Visit: Payer: Self-pay | Admitting: Family Medicine

## 2016-05-28 NOTE — Telephone Encounter (Signed)
Rx called in as prescribed 

## 2016-05-28 NOTE — Telephone Encounter (Signed)
Px written for call in   

## 2016-05-28 NOTE — Telephone Encounter (Signed)
Pt had CPE on 03/01/16, last filled on 04/26/16 #90 tabs with 0 refills, please advise

## 2016-07-19 ENCOUNTER — Ambulatory Visit: Payer: Self-pay | Admitting: Internal Medicine

## 2016-07-19 ENCOUNTER — Telehealth: Payer: Self-pay | Admitting: Family Medicine

## 2016-07-19 NOTE — Telephone Encounter (Signed)
Thanks - I will see her then  

## 2016-07-19 NOTE — Telephone Encounter (Signed)
I spoke with [name] who had previously spoke with [facility] (DPR signed) and pt has had weakness and not eating as well for about one month;betty said they prefer to see Dr Milinda Antis. [place] said OK to open the 10 AM combined with the 10:15 for 30 ' appt. [name] voiced understanding and said if pt condition worsened she will go to [facility] or ED prior to appt. FYI to Dr Milinda Antis.

## 2016-07-19 NOTE — Telephone Encounter (Signed)
Lorre Munroe, NP  Patience Musca, LPN        Have you triaged this pt? I usually see "weakness" in a 30 minute slot. Can you call her and see what is going on. That appt slot may not be appropriate for her.

## 2016-07-19 NOTE — Telephone Encounter (Signed)
atient Name: [name] DOB: 11/07/1920 Initial Comment Caller states her sister is very weak and not eating or drinking very much. Nurse Assessment Nurse: Charna Elizabeth, RN, Cathy Date/Time (Eastern Time): 07/19/2016 11:50:09 AM Confirm and document reason for call. If symptomatic, describe symptoms. ---Caller states that her sister developed weakness about a month ago that is present today. No severe breathing or swallowing difficulty. She hasn't been drinking or eating as much as usual. No fever. Alert and responsive. Does the patient have any new or worsening symptoms? ---Yes Will a triage be completed? ---Yes Related visit to physician within the last 2 weeks? ---No Does the PT have any chronic conditions? (i.e. diabetes, asthma, etc.) ---Yes List chronic conditions. ---High Blood Pressure, Severe Arthritis and Osteoporosis, Bilateral Hip Replacements, High Cholesterol Is this a behavioral health or substance abuse call? ---No Guidelines Guideline Title Affirmed Question Affirmed Notes Weakness (Generalized) and Fatigue [1] MODERATE weakness (i.e., interferes with work, school, normal activities) AND [2] cause unknown (Exceptions: weakness with acute minor illness, or weakness from poor fluid intake) Final Disposition User See Physician within 4 Hours (or PCP triage) Charna Elizabeth, RN, Cathy Comments Scheduled for 4:15pm appointment with Sampson Si, NP today per Betty's request. [name] will call back if Myrla's symptoms change, worsen or new symptoms develop. Referrals REFERRED TO PCP OFFICE Disagree/Comply: Comply

## 2016-07-20 ENCOUNTER — Encounter: Payer: Self-pay | Admitting: Family Medicine

## 2016-07-20 ENCOUNTER — Observation Stay: Payer: Medicare Other

## 2016-07-20 ENCOUNTER — Inpatient Hospital Stay
Admission: EM | Admit: 2016-07-20 | Discharge: 2016-07-25 | DRG: 312 | Disposition: A | Discharge status: Skilled Nursing Facility | Payer: Medicare Other | Attending: Specialist | Admitting: Specialist

## 2016-07-20 ENCOUNTER — Emergency Department: Payer: Medicare Other

## 2016-07-20 ENCOUNTER — Ambulatory Visit (INDEPENDENT_AMBULATORY_CARE_PROVIDER_SITE_OTHER): Payer: Medicare Other | Admitting: Family Medicine

## 2016-07-20 VITALS — BP 136/72 | HR 120 | Temp 97.4°F | Ht 64.0 in | Wt 110.0 lb

## 2016-07-20 DIAGNOSIS — E538 Deficiency of other specified B group vitamins: Secondary | ICD-10-CM | POA: Diagnosis not present

## 2016-07-20 DIAGNOSIS — Z885 Allergy status to narcotic agent status: Secondary | ICD-10-CM

## 2016-07-20 DIAGNOSIS — R5383 Other fatigue: Secondary | ICD-10-CM | POA: Insufficient documentation

## 2016-07-20 DIAGNOSIS — R1310 Dysphagia, unspecified: Secondary | ICD-10-CM | POA: Diagnosis present

## 2016-07-20 DIAGNOSIS — M8580 Other specified disorders of bone density and structure, unspecified site: Secondary | ICD-10-CM | POA: Diagnosis present

## 2016-07-20 DIAGNOSIS — R404 Transient alteration of awareness: Secondary | ICD-10-CM | POA: Diagnosis not present

## 2016-07-20 DIAGNOSIS — I6523 Occlusion and stenosis of bilateral carotid arteries: Secondary | ICD-10-CM | POA: Diagnosis not present

## 2016-07-20 DIAGNOSIS — Z79899 Other long term (current) drug therapy: Secondary | ICD-10-CM

## 2016-07-20 DIAGNOSIS — R946 Abnormal results of thyroid function studies: Secondary | ICD-10-CM | POA: Diagnosis not present

## 2016-07-20 DIAGNOSIS — M1990 Unspecified osteoarthritis, unspecified site: Secondary | ICD-10-CM | POA: Diagnosis not present

## 2016-07-20 DIAGNOSIS — Z7982 Long term (current) use of aspirin: Secondary | ICD-10-CM

## 2016-07-20 DIAGNOSIS — R627 Adult failure to thrive: Secondary | ICD-10-CM | POA: Diagnosis not present

## 2016-07-20 DIAGNOSIS — R4182 Altered mental status, unspecified: Secondary | ICD-10-CM | POA: Diagnosis not present

## 2016-07-20 DIAGNOSIS — M79604 Pain in right leg: Secondary | ICD-10-CM

## 2016-07-20 DIAGNOSIS — R739 Hyperglycemia, unspecified: Secondary | ICD-10-CM

## 2016-07-20 DIAGNOSIS — I951 Orthostatic hypotension: Secondary | ICD-10-CM | POA: Diagnosis not present

## 2016-07-20 DIAGNOSIS — Z8249 Family history of ischemic heart disease and other diseases of the circulatory system: Secondary | ICD-10-CM

## 2016-07-20 DIAGNOSIS — M79605 Pain in left leg: Secondary | ICD-10-CM

## 2016-07-20 DIAGNOSIS — N179 Acute kidney failure, unspecified: Secondary | ICD-10-CM | POA: Diagnosis not present

## 2016-07-20 DIAGNOSIS — Z79891 Long term (current) use of opiate analgesic: Secondary | ICD-10-CM

## 2016-07-20 DIAGNOSIS — R55 Syncope and collapse: Secondary | ICD-10-CM | POA: Diagnosis not present

## 2016-07-20 DIAGNOSIS — E785 Hyperlipidemia, unspecified: Secondary | ICD-10-CM | POA: Diagnosis not present

## 2016-07-20 DIAGNOSIS — Z66 Do not resuscitate: Secondary | ICD-10-CM | POA: Diagnosis present

## 2016-07-20 DIAGNOSIS — Z96649 Presence of unspecified artificial hip joint: Secondary | ICD-10-CM | POA: Diagnosis present

## 2016-07-20 DIAGNOSIS — H9190 Unspecified hearing loss, unspecified ear: Secondary | ICD-10-CM | POA: Diagnosis present

## 2016-07-20 DIAGNOSIS — I639 Cerebral infarction, unspecified: Secondary | ICD-10-CM

## 2016-07-20 DIAGNOSIS — I10 Essential (primary) hypertension: Secondary | ICD-10-CM

## 2016-07-20 DIAGNOSIS — I4510 Unspecified right bundle-branch block: Secondary | ICD-10-CM | POA: Diagnosis present

## 2016-07-20 DIAGNOSIS — K5909 Other constipation: Secondary | ICD-10-CM | POA: Diagnosis not present

## 2016-07-20 DIAGNOSIS — I6529 Occlusion and stenosis of unspecified carotid artery: Secondary | ICD-10-CM | POA: Diagnosis present

## 2016-07-20 DIAGNOSIS — R5382 Chronic fatigue, unspecified: Secondary | ICD-10-CM | POA: Diagnosis not present

## 2016-07-20 DIAGNOSIS — M85839 Other specified disorders of bone density and structure, unspecified forearm: Secondary | ICD-10-CM | POA: Diagnosis not present

## 2016-07-20 DIAGNOSIS — R7989 Other specified abnormal findings of blood chemistry: Secondary | ICD-10-CM

## 2016-07-20 DIAGNOSIS — Z888 Allergy status to other drugs, medicaments and biological substances status: Secondary | ICD-10-CM

## 2016-07-20 LAB — CBC WITH DIFFERENTIAL/PLATELET
BASOS ABS: 0 10*3/uL (ref 0–0.1)
Basophils Relative: 0 %
EOS PCT: 0 %
Eosinophils Absolute: 0 10*3/uL (ref 0–0.7)
HCT: 40.1 % (ref 35.0–47.0)
Hemoglobin: 13.5 g/dL (ref 12.0–16.0)
LYMPHS ABS: 1.1 10*3/uL (ref 1.0–3.6)
LYMPHS PCT: 11 %
MCH: 31.6 pg (ref 26.0–34.0)
MCHC: 33.6 g/dL (ref 32.0–36.0)
MCV: 94 fL (ref 80.0–100.0)
MONO ABS: 0.7 10*3/uL (ref 0.2–0.9)
Monocytes Relative: 7 %
Neutro Abs: 7.9 10*3/uL — ABNORMAL HIGH (ref 1.4–6.5)
Neutrophils Relative %: 82 %
PLATELETS: 270 10*3/uL (ref 150–440)
RBC: 4.27 MIL/uL (ref 3.80–5.20)
RDW: 13.6 % (ref 11.5–14.5)
WBC: 9.7 10*3/uL (ref 3.6–11.0)

## 2016-07-20 LAB — COMPREHENSIVE METABOLIC PANEL
ALBUMIN: 4 g/dL (ref 3.5–5.0)
ALT: 10 U/L — AB (ref 14–54)
AST: 17 U/L (ref 15–41)
Alkaline Phosphatase: 47 U/L (ref 38–126)
Anion gap: 9 (ref 5–15)
BUN: 23 mg/dL — AB (ref 6–20)
CHLORIDE: 107 mmol/L (ref 101–111)
CO2: 23 mmol/L (ref 22–32)
Calcium: 9.9 mg/dL (ref 8.9–10.3)
Creatinine, Ser: 1.14 mg/dL — ABNORMAL HIGH (ref 0.44–1.00)
GFR calc Af Amer: 46 mL/min — ABNORMAL LOW (ref >60)
GFR, EST NON AFRICAN AMERICAN: 40 mL/min — AB (ref >60)
Glucose, Bld: 136 mg/dL — ABNORMAL HIGH (ref 65–99)
POTASSIUM: 3.8 mmol/L (ref 3.5–5.1)
SODIUM: 139 mmol/L (ref 135–145)
Total Bilirubin: 0.9 mg/dL (ref 0.3–1.2)
Total Protein: 7.3 g/dL (ref 6.5–8.1)

## 2016-07-20 LAB — LACTIC ACID, PLASMA
LACTIC ACID, VENOUS: 1.2 mmol/L (ref 0.5–1.9)
Lactic Acid, Venous: 1.5 mmol/L (ref 0.5–1.9)

## 2016-07-20 LAB — BRAIN NATRIURETIC PEPTIDE: B Natriuretic Peptide: 110 pg/mL — ABNORMAL HIGH (ref 0.0–100.0)

## 2016-07-20 LAB — URINALYSIS, COMPLETE (UACMP) WITH MICROSCOPIC
BACTERIA UA: NONE SEEN
Bilirubin Urine: NEGATIVE
Glucose, UA: NEGATIVE mg/dL
Hgb urine dipstick: NEGATIVE
KETONES UR: 5 mg/dL — AB
Leukocytes, UA: NEGATIVE
Nitrite: NEGATIVE
PROTEIN: NEGATIVE mg/dL
Specific Gravity, Urine: 1.039 — ABNORMAL HIGH (ref 1.005–1.030)
pH: 5 (ref 5.0–8.0)

## 2016-07-20 LAB — TROPONIN I
Troponin I: <0.03 ng/mL (ref <0.03)
Troponin I: <0.03 ng/mL (ref <0.03)

## 2016-07-20 MED ORDER — ENOXAPARIN SODIUM 40 MG/0.4ML ~~LOC~~ SOLN: 40.0000 mg | SUBCUTANEOUS | Status: DC

## 2016-07-20 MED ORDER — IOPAMIDOL (ISOVUE-300) INJECTION 61%
15.0000 mL | INTRAVENOUS | Status: AC
Start: 2016-07-20 — End: 2016-07-20
  Administered 2016-07-20: 15 mL via ORAL

## 2016-07-20 MED ORDER — ACETAMINOPHEN 325 MG PO TABS
650.0000 mg | ORAL_TABLET | Freq: Four times a day (QID) | ORAL | Status: DC | PRN
Start: 2016-07-20 — End: 2016-07-25

## 2016-07-20 MED ORDER — VITAMIN B-12 1000 MCG PO TABS
1000.0000 ug | ORAL_TABLET | Freq: Every day | ORAL | Status: DC
  Administered 2016-07-20 – 2016-07-25 (×6): 1000 ug via ORAL
  Filled 2016-07-20 (×6): qty 1

## 2016-07-20 MED ORDER — HYDROCODONE-ACETAMINOPHEN 5-325 MG PO TABS: 1.0000 | ORAL_TABLET | ORAL | Status: DC | PRN

## 2016-07-20 MED ORDER — OCUVITE-LUTEIN PO CAPS
1.0000 | ORAL_CAPSULE | Freq: Two times a day (BID) | ORAL | Status: DC
  Administered 2016-07-20 – 2016-07-25 (×9): 1 via ORAL
  Filled 2016-07-20 (×13): qty 1

## 2016-07-20 MED ORDER — SODIUM CHLORIDE 0.9% FLUSH
3.0000 mL | Freq: Two times a day (BID) | INTRAVENOUS | Status: DC
  Administered 2016-07-20 – 2016-07-24 (×8): 3 mL via INTRAVENOUS

## 2016-07-20 MED ORDER — ASPIRIN EC 81 MG PO TBEC
81.0000 mg | DELAYED_RELEASE_TABLET | Freq: Every day | ORAL | Status: DC
  Administered 2016-07-20 – 2016-07-25 (×6): 81 mg via ORAL
  Filled 2016-07-20 (×6): qty 1

## 2016-07-20 MED ORDER — ENOXAPARIN SODIUM 30 MG/0.3ML ~~LOC~~ SOLN
30.0000 mg | SUBCUTANEOUS | Status: DC
  Administered 2016-07-20: 30 mg via SUBCUTANEOUS
  Filled 2016-07-20: qty 0.3

## 2016-07-20 MED ORDER — ACETAMINOPHEN 650 MG RE SUPP: 650.0000 mg | Freq: Four times a day (QID) | RECTAL | Status: DC | PRN

## 2016-07-20 MED ORDER — CALCIUM CARBONATE-VITAMIN D 500-200 MG-UNIT PO TABS
1.0000 | ORAL_TABLET | Freq: Every day | ORAL | Status: DC
  Administered 2016-07-20 – 2016-07-25 (×6): 1 via ORAL
  Filled 2016-07-20 (×6): qty 1

## 2016-07-20 MED ORDER — VITAMIN E 180 MG (400 UNIT) PO CAPS
400.0000 [IU] | ORAL_CAPSULE | Freq: Every day | ORAL | Status: DC
  Administered 2016-07-20 – 2016-07-25 (×5): 400 [IU] via ORAL
  Filled 2016-07-20 (×6): qty 1

## 2016-07-20 MED ORDER — HYDRALAZINE HCL 20 MG/ML IJ SOLN
10.0000 mg | Freq: Four times a day (QID) | INTRAMUSCULAR | Status: DC | PRN
  Administered 2016-07-22: 10 mg via INTRAVENOUS
  Filled 2016-07-20: qty 1

## 2016-07-20 MED ORDER — HYDRALAZINE HCL 20 MG/ML IJ SOLN
20.0000 mg | Freq: Once | INTRAMUSCULAR | Status: AC
  Administered 2016-07-20: 20 mg via INTRAVENOUS
  Filled 2016-07-20: qty 1

## 2016-07-20 MED ORDER — ONDANSETRON HCL 4 MG PO TABS: 4.0000 mg | ORAL_TABLET | Freq: Four times a day (QID) | ORAL | Status: DC | PRN

## 2016-07-20 MED ORDER — ONDANSETRON HCL 4 MG/2ML IJ SOLN: 4.0000 mg | Freq: Four times a day (QID) | INTRAMUSCULAR | Status: DC | PRN

## 2016-07-20 MED ORDER — PRAVASTATIN SODIUM 10 MG PO TABS
10.0000 mg | ORAL_TABLET | Freq: Every day | ORAL | Status: DC
  Administered 2016-07-20 – 2016-07-24 (×5): 10 mg via ORAL
  Filled 2016-07-20 (×5): qty 1

## 2016-07-20 MED ORDER — SENNOSIDES-DOCUSATE SODIUM 8.6-50 MG PO TABS
1.0000 | ORAL_TABLET | Freq: Every evening | ORAL | Status: DC | PRN
  Administered 2016-07-20: 1 via ORAL
  Filled 2016-07-20: qty 1

## 2016-07-20 MED ORDER — VITAMIN C 500 MG PO TABS
500.0000 mg | ORAL_TABLET | Freq: Every day | ORAL | Status: DC
  Administered 2016-07-20 – 2016-07-25 (×6): 500 mg via ORAL
  Filled 2016-07-20 (×6): qty 1

## 2016-07-20 MED ORDER — SODIUM CHLORIDE 0.9 % IV SOLN
INTRAVENOUS | Status: DC
  Administered 2016-07-20: 16:00:00 via INTRAVENOUS

## 2016-07-20 MED ORDER — IOPAMIDOL (ISOVUE-300) INJECTION 61%
75.0000 mL | Freq: Once | INTRAVENOUS | Status: AC | PRN
  Administered 2016-07-20: 75 mL via INTRAVENOUS

## 2016-07-20 MED ORDER — IRBESARTAN 75 MG PO TABS
37.5000 mg | ORAL_TABLET | Freq: Every day | ORAL | Status: DC
  Administered 2016-07-20 – 2016-07-25 (×6): 37.5 mg via ORAL
  Filled 2016-07-20 (×6): qty 1

## 2016-07-20 NOTE — ED Notes (Signed)
Patient taken to CT scan.

## 2016-07-20 NOTE — ED Notes (Signed)
Patient ambulated to room commode with two assist. Patient urinated, but did not void in the hat placed in the toilet.

## 2016-07-20 NOTE — Progress Notes (Signed)
Subjective:    Patient ID: [name], female    DOB: 05-25-1920, 83 y.o.   MRN: [id number]  HPI Here for f/u of chronic problems and c/o general weakness Per family-not eating   Family is here today with her and has questions about medications and treatment   Wt Readings from Last 3 Encounters:  07/20/16 110 lb (49.9 kg)  03/01/16 114 lb 12 oz (52.1 kg)  02/16/16 112 lb 8 oz (51 kg)  not eating and drinking as much -per family  She says her appetite is low  Family thinks she may be dehydrated  bmi 18.8  No current uti symptoms  Will give ua today  Hx of carotid stenosis   Hx of hyperlipidemia Lab Results  Component Value Date   CHOL 135 02/16/2016   HDL 56.00 02/16/2016   LDLCALC 62 02/16/2016   LDLDIRECT 70.8 09/05/2009   TRIG 83.0 02/16/2016   CHOLHDL 2 02/16/2016     bp is stable today  No cp or palpitations or headaches or edema  No side effects to medicines  BP Readings from Last 3 Encounters:  07/20/16 136/72  03/01/16 126/68  02/16/16 138/70      Hx of elevated tsh with nl FT4 that we are watching Lab Results  Component Value Date   TSH 4.54 (H) 02/16/2016     Memory- had 19/20 on [employer] in the fall   Hearing loss- was going to get new hearing aides  Family is working on this - waiting on getting approved for a new one  She may choose to get just one while she is waiting    B12 Lab Results  Component Value Date   VITAMINB12 >1500 (H) 02/16/2016   Told to cut back on B12   Patient Active Problem List   Diagnosis Date Noted  . Fatigue 07/20/2016  . Mental status change 07/20/2016  . Syncope 07/20/2016  . Elevated TSH 02/11/2015  . Mobility impaired 06/18/2014  . Dystrophic nail 07/11/2013  . Seborrheic keratoses 07/11/2013  . Callus of foot 01/17/2013  . Encounter for Medicare annual wellness exam 07/10/2012  . Hyperglycemia 01/21/2012  . Leg pain, bilateral 10/15/2011  . Ankle edema 10/15/2011  . B12 deficiency 08/22/2008    . Osteopenia 07/09/2008  . Occlusion and stenosis of carotid artery 11/14/2006  . Hyperlipidemia LDL goal <130 06/09/2006  . Essential hypertension 06/09/2006  . CONSTIPATION, CHRONIC 06/09/2006  . OSTEOARTHRITIS 06/09/2006   Past Medical History:  Diagnosis Date  . Carotid stenosis   . Chronic constipation   . HLD (hyperlipidemia) 2002  . HTN (hypertension)   . Osteoarthritis   . Osteopenia    with arm fracture   Past Surgical History:  Procedure Laterality Date  . Abd [place]  12/07   Negative AAA  . carotid doppler  11/07   50-79 % RICA  . carotid doppler     50-69 % RICA, lessa then 50% LICA-overall no change  . COLONOSCOPY  8/02   Diverticulosis  . DEXA  08/2002   Osteopenia  . DEXA  4/10   Osteopenia (worse at forearm T-1.98)-imp at LS -1.0  . TOTAL HIP ARTHROPLASTY  6/06  . TOTAL HIP ARTHROPLASTY  10/06   Social History  Substance Use Topics  . Smoking status: Never Smoker  . Smokeless tobacco: Never Used  . Alcohol use No   Family History  Problem Relation Age of Onset  . Heart attack Father   . Mitral valve prolapse  Sister    Allergies  Allergen Reactions  . Alendronate Sodium     Leg tingling   . Hctz [Hydrochlorothiazide]     Dehydration/ syncope  . Hydrocodone     Syncope    No current facility-administered medications on file prior to visit.    Current Outpatient Prescriptions on File Prior to Visit  Medication Sig Dispense Refill  . Ascorbic Acid (VITAMIN C PO) Take 500 mg by mouth daily.     Marland Kitchen docusate sodium (COLACE) 50 MG capsule Take 50-100 mg by mouth as needed.     Marland Kitchen ibuprofen (ADVIL,MOTRIN) 200 MG tablet Take 200 mg by mouth as needed for pain.    Marland Kitchen lovastatin (MEVACOR) 20 MG tablet Take 1 tablet (20 mg total) by mouth daily. 30 tablet 11  . Multiple Vitamins-Minerals (PRESERVISION AREDS PO) Take 1 tablet by mouth 2 (two) times daily.     Marland Kitchen olmesartan (BENICAR) 40 MG tablet Take 1 tablet (40 mg total) by mouth daily. 30 tablet 11  .  Omega-3 Fatty Acids (FISH OIL PO) Take 1 capsule by mouth 2 (two) times daily.      . traMADol (ULTRAM) 50 MG tablet TAKE 1 TABLET BY MOUTH THREE TIMES DAILY AS NEEDED 90 tablet 3  . vitamin E 400 UNIT capsule Take 400 Units by mouth daily.      Marland Kitchen aspirin 81 MG tablet Take 81 mg by mouth daily.        Review of Systems Review of Systems  Constitutional: Negative for fever, pos for fatigue/ lack of appetite and decreased po intake   Eyes: Negative for pain and visual disturbance.  ENT pos for hearing loss  Respiratory: Negative for cough and shortness of breath.   Cardiovascular: Negative for cp or palpitations    Gastrointestinal: Negative for nausea, diarrhea and constipation.  Genitourinary: Negative for urgency and frequency. neg for burning on urination Skin: Negative for pallor or rash   Neurological: Negative for weakness, light-headedness, numbness and headaches.  Hematological: Negative for adenopathy. Does not bruise/bleed easily.  Psychiatric/Behavioral: Negative for dysphoric mood. The patient is not nervous/anxious.         Objective:   Physical Exam  Constitutional: She appears well-developed and well-nourished. No distress.  Frail appearing elderly female -extremely HOH  Early in visit she will answer questions but seems more quiet than usual   She experienced 4-5 min of unresponsiveness w/o LOC in the middle of her visit with drooling while sitting - then returned to her previous state   HENT:  Head: Normocephalic and atraumatic.  Mouth/Throat: Oropharynx is clear and moist.  Eyes: Conjunctivae and EOM are normal. Pupils are equal, round, and reactive to light. Right eye exhibits no discharge. Left eye exhibits no discharge. No scleral icterus.  Pupils were normally reactive throughout  Neck: Normal range of motion. Neck supple. No JVD present. Carotid bruit is not present. No tracheal deviation present. No thyromegaly present.  Cardiovascular: Normal rate, regular  rhythm, normal heart sounds and intact distal pulses.  Exam reveals no gallop.   Rate in 60s and regular throughout   Pulmonary/Chest: Effort normal and breath sounds normal. No respiratory distress. She has no wheezes. She has no rales. She exhibits no tenderness.  No crackles  She breathed slowly and deeply (regularly) during period of unresponsiveness  Abdominal: Soft. Bowel sounds are normal. She exhibits no distension, no abdominal bruit and no mass. There is no tenderness.  Musculoskeletal: She exhibits no edema or tenderness.  Lymphadenopathy:    She has no cervical adenopathy.  Neurological: She is alert. She has normal reflexes. No cranial nerve deficit. Coordination normal.  4-5 minute period of unresponsiveness sitting in chair w/o LOC Vitals remained stable but she became pale and drooled No facial droop or speech slurring  Returned to prev state w/o post ictal state  Difficult to assess for confusion as she could not hear today stated she wanted to go home instead of the hospital when EMS arrived but was not agitated  Able to ambulate with assistance  Skin: Skin is warm and dry. No rash noted.  Psychiatric: She has a normal mood and affect.  More lethargic than usual           Assessment & Plan:   Problem List Items Addressed This Visit      Cardiovascular and Mediastinum   Carotid stenosis    Per pt's sisters she has not been taking her 81 mg asa  Disc imp of this and she said she would begain again immediately      Essential hypertension - Primary    During unresponsive event - bp held at 110/60 with a pulse (regular in the 60s) She was orthostatic upon EMS eval after this event        Musculoskeletal and Integument   Osteopenia     Other   B12 deficiency    Oral supplementation Lab Results  Component Value Date   VITAMINB12 >1500 (H) 02/16/2016  was asked to cut dose in 1/2 after that reading in the fall       Elevated TSH    Lab Results    Component Value Date   TSH 4.54 (H) 02/16/2016   Watching FT4 which was in nl range Sub clinical      Fatigue    approx 1 mo of fatigue /malaise and decreased po intake  Did suspect poss uti or other infection and dehydration       Hyperglycemia    Lab Results  Component Value Date   HGBA1C 5.8 02/16/2016   Blood glucose was in nl range when checked by EMS      Hyperlipidemia LDL goal <130    Reviewed last lipid with family Disc goals for lipids and reasons to control them Rev labs with pt Rev low sat fat diet in detail       Leg pain, bilateral    Chronic from OA - mobility impairment Controlled with tramadol (caution)       Mental status change    4-5 min episode of unresponsiveness during visit with drooling but no seizure actiivty/ LOC/or post ictal state  Suspect UTI-had not yet obt her ua  Appeared somewhtat dehydrated -was orthostatic with ems eval  Transported to the hospital stable

## 2016-07-20 NOTE — Patient Instructions (Addendum)
I think you should go forward and get one hearing aide while you wait for the other one   You need to eat 3 meals with protein every day with snacks in between  Eat even when you are not hungry  Have family come and check with you to make sure you are eating    Please aim for 64 oz of fluids a day - mostly water  Milk is ok also   Take 81 mg of aspirin daily with a meal   Take your calcium plus D twice daily    PLEASE EAT MORE ! PLEASE DRINK MORE FLUIDS! WE ARE CHECKING A URINE TEST TODAY TO SEE IF YOU HAVE A UTI   FOLLOW UP IN ABOUT 2 WEEKS   I WOULD LIKE ONE OF YOUR SISTERS TO STAY WITH YOU FOR A FEW DAYS TO HELP YOU OUT UNTIL YOU FEEL BETTER   (these inst were printed prior to pt's episode of unresponsiveness and then she was transported to the hospital)

## 2016-07-20 NOTE — Progress Notes (Signed)
Pre visit review using our clinic review tool, if applicable. No additional management support is needed unless otherwise documented below in the visit note. 

## 2016-07-20 NOTE — ED Provider Notes (Signed)
[facility] Emergency Department Provider Note   ____________________________________________   First MD Initiated Contact with Patient 07/20/16 1154     (approximate)  I have reviewed the triage vital signs and the nursing notes.   HISTORY  Chief Complaint Loss of Consciousness    HPI [name] is a 83 y.o. femalecomes in by EMS. EMS provides a history. Patient went to the doctor's office because of decreased intake today to thrive. While at the office patient sitting in a chair and passed out slumped over and began to drool for for 5 minutes woke up immediately with no postictal confusion type symptoms. She had no seizure activity. She was sitting down when she passed out. Patient is very deaf and everything has to be retreated 5 or 6 times to get her to understand.   Past Medical History:  Diagnosis Date  . Carotid stenosis   . Chronic constipation   . HLD (hyperlipidemia) 2002  . HTN (hypertension)   . Osteoarthritis   . Osteopenia    with arm fracture    Patient Active Problem List   Diagnosis Date Noted  . Fatigue 07/20/2016  . Mental status change 07/20/2016  . Syncope 07/20/2016  . Elevated TSH 02/11/2015  . Mobility impaired 06/18/2014  . Dystrophic nail 07/11/2013  . Seborrheic keratoses 07/11/2013  . Callus of foot 01/17/2013  . Encounter for Medicare annual wellness exam 07/10/2012  . Hyperglycemia 01/21/2012  . Leg pain, bilateral 10/15/2011  . Ankle edema 10/15/2011  . B12 deficiency 08/22/2008  . Osteopenia 07/09/2008  . Occlusion and stenosis of carotid artery 11/14/2006  . Hyperlipidemia LDL goal <130 06/09/2006  . Essential hypertension 06/09/2006  . CONSTIPATION, CHRONIC 06/09/2006  . OSTEOARTHRITIS 06/09/2006    Past Surgical History:  Procedure Laterality Date  . Abd [place]  12/07   Negative AAA  . carotid doppler  11/07   50-79 % RICA  . carotid doppler     50-69 % RICA, lessa then 50% LICA-overall no  change  . COLONOSCOPY  8/02   Diverticulosis  . DEXA  08/2002   Osteopenia  . DEXA  4/10   Osteopenia (worse at forearm T-1.98)-imp at LS -1.0  . TOTAL HIP ARTHROPLASTY  6/06  . TOTAL HIP ARTHROPLASTY  10/06    Prior to Admission medications   Medication Sig Start Date End Date Taking? Authorizing Provider  Ascorbic Acid (VITAMIN C PO) Take 500 mg by mouth daily.     Historical Provider, MD  aspirin 81 MG tablet Take 81 mg by mouth daily.      Historical Provider, MD  Calcium Carb-Cholecalciferol (CALCIUM+D3) 600-800 MG-UNIT TABS Take 1 tablet by mouth daily.    Historical Provider, MD  docusate sodium (COLACE) 50 MG capsule Take 50-100 mg by mouth as needed.     Historical Provider, MD  ibuprofen (ADVIL,MOTRIN) 200 MG tablet Take 200 mg by mouth as needed for pain.    Historical Provider, MD  lovastatin (MEVACOR) 20 MG tablet Take 1 tablet (20 mg total) by mouth daily. 03/01/16   Judy Pimple, MD  Multiple Vitamins-Minerals (PRESERVISION AREDS PO) Take 1 tablet by mouth 2 (two) times daily.     Historical Provider, MD  olmesartan (BENICAR) 40 MG tablet Take 1 tablet (40 mg total) by mouth daily. 03/01/16   Judy Pimple, MD  Omega-3 Fatty Acids (FISH OIL PO) Take 1 capsule by mouth 2 (two) times daily.      Historical Provider, MD  traMADol (ULTRAM) 50 MG tablet TAKE 1 TABLET BY MOUTH THREE TIMES DAILY AS NEEDED 05/28/16   Judy Pimple, MD  vitamin B-12 (CYANOCOBALAMIN) 1000 MCG tablet Take 1,000 mcg by mouth daily.    Historical Provider, MD  vitamin E 400 UNIT capsule Take 400 Units by mouth daily.      Historical Provider, MD    Allergies Alendronate sodium; Hctz [hydrochlorothiazide]; and Hydrocodone  Family History  Problem Relation Age of Onset  . Heart attack Father   . Mitral valve prolapse Sister     Social History Social History  Substance Use Topics  . Smoking status: Never Smoker  . Smokeless tobacco: Never Used  . Alcohol use No    Review of Systems Difficult  to be sure but as near as I can tell Constitutional: No fever/chills Eyes: No visual changes. ENT: No sore throat. Cardiovascular: Denies chest pain. Respiratory: Denies shortness of breath. Gastrointestinal: No abdominal pain.  No nausea, no vomiting.  No diarrhea.  No constipation. Genitourinary: Negative for dysuria. Musculoskeletal: Negative for back pain. Skin: Negative for rash. Neurological no focal weakness or numbness  ____________________________________________   PHYSICAL EXAM:  VITAL SIGNS: ED Triage Vitals  Enc Vitals Group     BP 07/20/16 1155 (!) 181/63     Pulse Rate 07/20/16 1155 66     Resp 07/20/16 1155 17     Temp 07/20/16 1155 97.5 F (36.4 C)     Temp Source 07/20/16 1155 Oral     SpO2 07/20/16 1155 100 %     Weight 07/20/16 1151 110 lb (49.9 kg)     Height 07/20/16 1151  (1.676 m)     Head Circumference --      Peak Flow --      Pain Score 07/20/16 1151 0     Pain Loc --      Pain Edu? --      Excl. in GC? --     Constitutional: Alert and oriented. Well appearing and in no acute distress. Eyes: Conjunctivae are normal. PERRL. EOMI. Head: Atraumatic. Nose: No congestion/rhinnorhea. Mouth/Throat: Mucous membranes are moist.  Oropharynx non-erythematous. Neck: No stridor.  Cardiovascular: Normal rate, regular rhythm. Grossly normal heart sounds.  Good peripheral circulation. Respiratory: Normal respiratory effort.  No retractions. Lungs CTAB. Gastrointestinal: Soft and nontender. No distention. No abdominal bruits. No CVA tenderness. Musculoskeletal: No lower extremity tenderness nor edema.  No joint effusions. Neurologic:  Normal speech and language. No gross focal neurologic deficits are appreciated.  Skin:  Skin is warm, dry and intact. No rash noted.  ____________________________________________   LABS (all labs ordered are listed, but only abnormal results are displayed)  Labs Reviewed  CBC WITH DIFFERENTIAL/PLATELET - Abnormal;  Notable for the following:       Result Value   Neutro Abs 7.9 (*)    All other components within normal limits  URINALYSIS, COMPLETE (UACMP) WITH MICROSCOPIC  COMPREHENSIVE METABOLIC PANEL  BRAIN NATRIURETIC PEPTIDE  TROPONIN I  LACTIC ACID, PLASMA  LACTIC ACID, PLASMA  CBG MONITORING, ED   ____________________________________________  EKG EKG read and interpreted by me shows normal sinus rhythm rate of 71 left axis right bundle branch block no apparent acute ST-T wave changes _________________________________________  RADIOLOGY Pending  ____________________________________________   PROCEDURES  Procedure(s) performed:   Procedures  Critical Care performed:   ____________________________________________   INITIAL IMPRESSION / ASSESSMENT AND PLAN / ED COURSE  Pertinent labs & imaging results that were available during my care  of the patient were reviewed by me and considered in my medical decision making (see chart for details).    NEW MEDICATIONS STARTED DURING THIS VISIT:  New Prescriptions   No medications on file     Note:  This document was prepared using Dragon voice recognition software and may include unintentional dictation errors.     Arnaldo Natal, MD 07/20/16 [phone]

## 2016-07-20 NOTE — H&P (Signed)
[facility] - [place] [facility]   PATIENT NAME: [name]    MR#:  [id number]  DATE OF BIRTH:  08-19-1920  DATE OF ADMISSION:  07/20/2016  PRIMARY CARE PHYSICIAN: Roxy Manns, MD   REQUESTING/REFERRING PHYSICIAN: Dr. Darnelle Catalan  CHIEF COMPLAINT:   Past add at PCP office HISTORY OF PRESENT ILLNESS:  [name]  is a 83 y.o. female with a known history of Essential hypertension and hyperlipidemia who had a syncopal episode at PCP office. Patient had a follow-up visit today with her PCP. Apparently over the past 2-3 weeks patient has had decreased by mouth intake and generalized weakness. Family was concerned and expressed their concern to primary care physician. Patient was in PCP office today when she had a syncopal episode. PCP was giving the patient lab work and apparently slumped over and was drooling for approximately 4-5 minutes she was unresponsive. When she came to she was back to her baseline. No seizure activity was reported. Patient had been in her usual state of health prior. Upon EMS arrival patient was noted to have significant orthostatic hypotension. EKG shows LVH and right bundle branch block.  PAST MEDICAL HISTORY:   Past Medical History:  Diagnosis Date  . Carotid stenosis   . Chronic constipation   . HLD (hyperlipidemia) 2002  . HTN (hypertension)   . Osteoarthritis   . Osteopenia    with arm fracture    PAST SURGICAL HISTORY:   Past Surgical History:  Procedure Laterality Date  . Abd [place]  12/07   Negative AAA  . carotid doppler  11/07   50-79 % RICA  . carotid doppler     50-69 % RICA, lessa then 50% LICA-overall no change  . COLONOSCOPY  8/02   Diverticulosis  . DEXA  08/2002   Osteopenia  . DEXA  4/10   Osteopenia (worse at forearm T-1.98)-imp at LS -1.0  . TOTAL HIP ARTHROPLASTY  6/06  . TOTAL HIP ARTHROPLASTY  10/06    SOCIAL HISTORY:   Social History  Substance Use Topics  . Smoking status: Never Smoker  .  Smokeless tobacco: Never Used  . Alcohol use No    FAMILY HISTORY:   Family History  Problem Relation Age of Onset  . Heart attack Father   . Mitral valve prolapse Sister     DRUG ALLERGIES:   Allergies  Allergen Reactions  . Alendronate Sodium     Leg tingling   . Hctz [Hydrochlorothiazide]     Dehydration/ syncope  . Hydrocodone     Syncope     REVIEW OF SYSTEMS:   Review of Systems  Constitutional: Negative for chills, fever and malaise/fatigue.       Poor by mouth intake with generalized weakness  HENT: Negative.  Negative for ear discharge, ear pain, hearing loss, nosebleeds and sore throat.   Eyes: Negative.  Negative for blurred vision and pain.  Respiratory: Negative.  Negative for cough, hemoptysis, shortness of breath and wheezing.   Cardiovascular: Negative for chest pain, palpitations and leg swelling.       Syncopal episode today  Gastrointestinal: Negative.  Negative for abdominal pain, blood in stool, diarrhea, nausea and vomiting.  Genitourinary: Negative.  Negative for dysuria.  Musculoskeletal: Negative.  Negative for back pain.  Skin: Negative.   Neurological: Positive for weakness. Negative for dizziness, tremors, speech change, focal weakness, seizures and headaches.  Endo/Heme/Allergies: Negative.  Does not bruise/bleed easily.  Psychiatric/Behavioral: Negative.  Negative for depression, hallucinations and  suicidal ideas.    MEDICATIONS AT HOME:   Prior to Admission medications   Medication Sig Start Date End Date Taking? Authorizing Provider  Ascorbic Acid (VITAMIN C PO) Take 500 mg by mouth daily.     Historical Provider, MD  aspirin 81 MG tablet Take 81 mg by mouth daily.      Historical Provider, MD  Calcium Carb-Cholecalciferol (CALCIUM+D3) 600-800 MG-UNIT TABS Take 1 tablet by mouth daily.    Historical Provider, MD  docusate sodium (COLACE) 50 MG capsule Take 50-100 mg by mouth as needed.     Historical Provider, MD  ibuprofen  (ADVIL,MOTRIN) 200 MG tablet Take 200 mg by mouth as needed for pain.    Historical Provider, MD  lovastatin (MEVACOR) 20 MG tablet Take 1 tablet (20 mg total) by mouth daily. 03/01/16   Judy Pimple, MD  Multiple Vitamins-Minerals (PRESERVISION AREDS PO) Take 1 tablet by mouth 2 (two) times daily.     Historical Provider, MD  olmesartan (BENICAR) 40 MG tablet Take 1 tablet (40 mg total) by mouth daily. 03/01/16   Judy Pimple, MD  Omega-3 Fatty Acids (FISH OIL PO) Take 1 capsule by mouth 2 (two) times daily.      Historical Provider, MD  traMADol (ULTRAM) 50 MG tablet TAKE 1 TABLET BY MOUTH THREE TIMES DAILY AS NEEDED 05/28/16   Judy Pimple, MD  vitamin B-12 (CYANOCOBALAMIN) 1000 MCG tablet Take 1,000 mcg by mouth daily.    Historical Provider, MD  vitamin E 400 UNIT capsule Take 400 Units by mouth daily.      Historical Provider, MD      VITAL SIGNS:  Blood pressure (!) 144/65, pulse 76, temperature 97.5 F (36.4 C), temperature source Oral, resp. rate 18, height  (1.676 m), weight 49.9 kg (110 lb), SpO2 99 %.  PHYSICAL EXAMINATION:   Physical Exam  Constitutional: She is oriented to person, place, and time and well-developed, well-nourished, and in no distress. No distress.  HENT:  Head: Normocephalic.  Wearing hearing aids  Eyes: No scleral icterus.  Neck: Normal range of motion. Neck supple. No JVD present. No tracheal deviation present.  Cardiovascular: Normal rate, regular rhythm and normal heart sounds.  Exam reveals no gallop and no friction rub.   No murmur heard. Pulmonary/Chest: Effort normal and breath sounds normal. No respiratory distress. She has no wheezes. She has no rales. She exhibits no tenderness.  Abdominal: Soft. Bowel sounds are normal. She exhibits no distension and no mass. There is no tenderness. There is no rebound and no guarding.  Musculoskeletal: Normal range of motion. She exhibits no edema.  Neurological: She is alert and oriented to person, place,  and time.  Skin: Skin is warm. No rash noted. No erythema.  Psychiatric: Affect and judgment normal.      LABORATORY PANEL:   CBC  Recent Labs Lab 07/20/16 1218  WBC 9.7  HGB 13.5  HCT 40.1  PLT 270   ------------------------------------------------------------------------------------------------------------------  Chemistries   Recent Labs Lab 07/20/16 1218  NA 139  K 3.8  CL 107  CO2 23  GLUCOSE 136*  BUN 23*  CREATININE 1.14*  CALCIUM 9.9  AST 17  ALT 10*  ALKPHOS 47  BILITOT 0.9   ------------------------------------------------------------------------------------------------------------------  Cardiac Enzymes  Recent Labs Lab 07/20/16 1218  TROPONINI <0.03   ------------------------------------------------------------------------------------------------------------------  RADIOLOGY:  Dg Chest Portable 1 View  Result Date: 07/20/2016 CLINICAL DATA:  83 y/o  F; syncopal episode. EXAM: PORTABLE CHEST 1 VIEW  COMPARISON:  11/09/2010 chest radiograph. FINDINGS: Stable cardiac silhouette. Aortic atherosclerosis with calcification. Stable biapical pleuroparenchymal scarring. Stable nodularity of the right suprahilar region. No consolidation, effusion, or pneumothorax. Mild levocurvature of the lower thoracic spine. IMPRESSION: No acute pulmonary process identified.  Aortic atherosclerosis. Electronically Signed   By: Mitzi Hansen M.D.   On: 07/20/2016 13:36    EKG:   Sinus rhythm with right bundle blanch block and LVH  IMPRESSION AND PLAN:   83 year old female with a history of essential hypertension and hyperlipidemia who had a syncopal episode at PCP office.  1. Syncope due to orthostasis Continue IV fluids Telemetry monitoring Troponins 3 Check orthostatics after hydration With history of carotid stenosis I will order carotid Doppler.  2. Accelerated Essential hypertension: Continue ARB and add when necessary hydralazine  3.  Hyperlipidemia: Continue statin  4. AKI: from poor po intake IVF and repeat in am  5. Adult FTT; Dietary consult, PT and CM consult  All the records are reviewed and case discussed with ED provider. Management plans discussed with the patient and she is in agreement  CODE STATUS: dnr  TOTAL TIME TAKING CARE OF THIS PATIENT: 40 minutes.    [name] M.D on 07/20/2016 at 2:18 PM  Between 7am to 6pm - Pager - [phone]  After 6pm go to [email or web address] - Social research officer, government  [facility]  Office  [phone]  CC: Primary care physician; Roxy Manns, MD

## 2016-07-20 NOTE — Progress Notes (Signed)
Lovenox dose adjusted per protocol to  q24h on patient with CrCl <30 ml/min.  Pharmacy will monitor per guidelines and adjust as needed.

## 2016-07-20 NOTE — ED Triage Notes (Signed)
Pt arrived via ems for c/o syncopal episode at PCP office - pt went to PCP office for decreased intake and failure to thrive - while at the office pt had a syncopal episode that the PCP reported last 4-5 minutes - pt slumped over and began to drool - pt then aroused and became A&O x4 and returned to baseline immediately - reported to have been orthostatic at PCP office

## 2016-07-21 ENCOUNTER — Telehealth: Payer: Self-pay

## 2016-07-21 DIAGNOSIS — I10 Essential (primary) hypertension: Secondary | ICD-10-CM | POA: Diagnosis not present

## 2016-07-21 DIAGNOSIS — R1310 Dysphagia, unspecified: Secondary | ICD-10-CM | POA: Diagnosis not present

## 2016-07-21 DIAGNOSIS — E785 Hyperlipidemia, unspecified: Secondary | ICD-10-CM | POA: Diagnosis not present

## 2016-07-21 DIAGNOSIS — R55 Syncope and collapse: Secondary | ICD-10-CM | POA: Diagnosis not present

## 2016-07-21 LAB — CBC
HCT: 36.8 % (ref 35.0–47.0)
HEMOGLOBIN: 12.7 g/dL (ref 12.0–16.0)
MCH: 32.1 pg (ref 26.0–34.0)
MCHC: 34.5 g/dL (ref 32.0–36.0)
MCV: 93 fL (ref 80.0–100.0)
Platelets: 252 10*3/uL (ref 150–440)
RBC: 3.96 MIL/uL (ref 3.80–5.20)
RDW: 14.1 % (ref 11.5–14.5)
WBC: 9.1 10*3/uL (ref 3.6–11.0)

## 2016-07-21 LAB — BASIC METABOLIC PANEL
ANION GAP: 6 (ref 5–15)
BUN: 18 mg/dL (ref 6–20)
CO2: 25 mmol/L (ref 22–32)
Calcium: 9.8 mg/dL (ref 8.9–10.3)
Chloride: 107 mmol/L (ref 101–111)
Creatinine, Ser: 0.85 mg/dL (ref 0.44–1.00)
GFR calc non Af Amer: 57 mL/min — ABNORMAL LOW (ref >60)
GLUCOSE: 112 mg/dL — AB (ref 65–99)
Potassium: 3.7 mmol/L (ref 3.5–5.1)
Sodium: 138 mmol/L (ref 135–145)

## 2016-07-21 LAB — TROPONIN I: TROPONIN I: 0.03 ng/mL — AB (ref <0.03)

## 2016-07-21 MED ORDER — ENSURE ENLIVE PO LIQD
237.0000 mL | Freq: Three times a day (TID) | ORAL | Status: DC
  Administered 2016-07-21 – 2016-07-25 (×9): 237 mL via ORAL

## 2016-07-21 MED ORDER — ENOXAPARIN SODIUM 40 MG/0.4ML ~~LOC~~ SOLN
40.0000 mg | SUBCUTANEOUS | Status: DC
  Administered 2016-07-21 – 2016-07-24 (×4): 40 mg via SUBCUTANEOUS
  Filled 2016-07-21 (×4): qty 0.4

## 2016-07-21 MED ORDER — MEGESTROL ACETATE 40 MG PO TABS
40.0000 mg | ORAL_TABLET | Freq: Every day | ORAL | Status: DC
  Administered 2016-07-21 – 2016-07-22 (×2): 40 mg via ORAL
  Filled 2016-07-21 (×2): qty 1

## 2016-07-21 NOTE — Progress Notes (Addendum)
Initial Nutrition Assessment  DOCUMENTATION CODES:   Severe malnutrition in context of chronic illness  INTERVENTION:  1. Ensure Enlive po TID, each supplement provides 350 kcal and 20 grams of protein  2. Recommend Megace or some other appetite stimulant given patient's long term poor appetite. No history of dementia in chart so patient's appetite should be improved by this and help with failure to thrive. Otherwise, could be natural aging process.  NUTRITION DIAGNOSIS:   Malnutrition related to chronic illness as evidenced by severe depletion of muscle mass, severe depletion of body fat.  GOAL:   Patient will meet greater than or equal to 90% of their needs  MONITOR:   PO intake, I & O's, Labs, Weight trends, Supplement acceptance  REASON FOR ASSESSMENT:   Consult Assessment of nutrition requirement/status  ASSESSMENT:   [name]  is a 83 y.o. female with a known history of Essential hypertension and hyperlipidemia who had a syncopal episode at PCP office.  Spoke with patient at bedside. She is very hard of hearing. Was eating eggs, bacon, toast, grits during my visit. Requesting Coffee. Had eaten very little of her meal thus far. Documented PO from yesterday: 50%  States she normally eats eggs and sausage for breakfast, "whatever I want"  for Lunch and Supper. Appetite has been poor for a while per patient - but she is unable to quantify a time. Weight hx per chart, patient exhibits 6#/5.3% insignificant wt loss over 4 months.  Nutrition-Focused physical exam completed. Findings are severe fat depletion, severe muscle depletion, and no edema.   Labs and medications reviewed: Vitamin C, Vitamin E, B12, MVI w/ Lutein, Ca-Vit D NS @ 24mL/hr   Addendum:  Spoke with Sister, Brother-in-Law at bedside According to them, patient hasn't eaten much of anything since Easter, lost 4# since December. Very concerned. Explained that we can try medication if MD permits  but could be natural aging process and patient may be ready to pass on given her immediate shift to poor PO intake with no clear underlying cause at this moment.  Diet Order:  Diet regular Room service appropriate? Yes; Fluid consistency: Thin  Skin:  Reviewed, no issues  Last BM:  07/18/2016  Height:   Ht Readings from Last 1 Encounters:  07/20/16  (1.676 m)    Weight:   Wt Readings from Last 1 Encounters:  07/20/16 108 lb 9.6 oz (49.3 kg)    Ideal Body Weight:  59.09 kg  BMI:  Body mass index is 17.53 kg/m.  Estimated Nutritional Needs:   Kcal:  1300-1500 calories  Protein:  49-60 gm  Fluid:  >/= 1.3L  EDUCATION NEEDS:   Education needs no appropriate at this time  Dionne Ano. Enes Rokosz, MS, RD LDN Inpatient Clinical Dietitian Pager [phone]

## 2016-07-21 NOTE — Care Management Obs Status (Signed)
MEDICARE OBSERVATION STATUS NOTIFICATION   Patient Details  Name: GRACEANN BOILEAU MRN: [id number] Date of Birth: [birth year]   Medicare Observation Status Notification Given:  Yes  Patient and her sister presented with notice.  Many questions about why patient is an outpatient and none of CM answers seem to satisfy- especially sister.  Declines to sign the notice.     Eber Hong, RN 07/21/2016, 4:06 PM

## 2016-07-21 NOTE — Assessment & Plan Note (Signed)
4-5 min episode of unresponsiveness during visit with drooling but no seizure actiivty/ LOC/or post ictal state  Suspect UTI-had not yet obt her ua  Appeared somewhtat dehydrated -was orthostatic with ems eval  Transported to the hospital stable

## 2016-07-21 NOTE — Assessment & Plan Note (Signed)
Per pt's sisters she has not been taking her 81 mg asa  Disc imp of this and she said she would begain again immediately

## 2016-07-21 NOTE — Telephone Encounter (Signed)
[name] left v/m; pt was seen 07/20/16 and taken to hospital by ambulance. [name] has been advised that pt is not considered to be a true admission but pt is still in the hospital. [name] wants to know what to do to get pt admitted to hospital for 3 days so when pt is admitted to skilled nursing medicare will cover in full first 20 days and then a portion of cost for the next 100 days. Betty request cb.

## 2016-07-21 NOTE — Assessment & Plan Note (Signed)
approx 1 mo of fatigue /malaise and decreased po intake  Did suspect poss uti or other infection and dehydration

## 2016-07-21 NOTE — Assessment & Plan Note (Signed)
Lab Results  Component Value Date   TSH 4.54 (H) 02/16/2016   Watching FT4 which was in nl range Sub clinical

## 2016-07-21 NOTE — Progress Notes (Signed)
Anticoagulation monitoring(Lovenox):  83yo  F ordered Lovenox 30 mg Q24h  Filed Weights   07/20/16 1151 07/20/16 1500  Weight: 110 lb (49.9 kg) 108 lb 9.6 oz (49.3 kg)   BMI 17.8   Lab Results  Component Value Date   CREATININE 0.85 07/21/2016   CREATININE 1.14 (H) 07/20/2016   CREATININE 0.87 02/16/2016   Estimated Creatinine Clearance: 31.5 mL/min (by C-G formula based on SCr of 0.85 mg/dL). Hemoglobin & Hematocrit     Component Value Date/Time   HGB 12.7 07/21/2016 0307   HCT 36.8 07/21/2016 0307    Renal fxn improved: Per Protocol for Patient with estCrcl > 30 ml/min and BMI < 40, will transition to Lovenox 40 mg Q24h      Bari Mantis PharmD Clinical Pharmacist 07/21/2016

## 2016-07-21 NOTE — Telephone Encounter (Signed)
Since I do not work in the hospital or have admitting privileges -she will need to talk to the attending physician taking care of her in the hospital and voice her concerns  Sorry I cannot be of more help with that  I hope she is feeling better !

## 2016-07-21 NOTE — Assessment & Plan Note (Signed)
Reviewed last lipid with family Disc goals for lipids and reasons to control them Rev labs with pt Rev low sat fat diet in detail

## 2016-07-21 NOTE — Progress Notes (Signed)
[employer] [facility] [place] [facility]   PATIENT NAME: [name]    MR#:  [id number]  DATE OF BIRTH:  05-19-1920  SUBJECTIVE:  CHIEF COMPLAINT:   Chief Complaint  Patient presents with  . Loss of Consciousness     Decreased oral intake for last 2-3 weeks, has significant weakness in so taken to PMDs office yesterday, where she had an episode of unresponsiveness but she was conscious at that time and her vitals were stable. Found to have slight dehydration and malnutrition so admitted to medical service. Today on further questioning family also complains of her having choking symptoms with water or other liquids.  REVIEW OF SYSTEMS:  CONSTITUTIONAL: No fever, fatigue or weakness.  EYES: No blurred or double vision.  EARS, NOSE, AND THROAT: No tinnitus or ear pain.  RESPIRATORY: No cough, shortness of breath, wheezing or hemoptysis.  CARDIOVASCULAR: No chest pain, orthopnea, edema.  GASTROINTESTINAL: No nausea, vomiting, diarrhea or abdominal pain.  GENITOURINARY: No dysuria, hematuria.  ENDOCRINE: No polyuria, nocturia,  HEMATOLOGY: No anemia, easy bruising or bleeding SKIN: No rash or lesion. MUSCULOSKELETAL: No joint pain or arthritis.   NEUROLOGIC: No tingling, numbness, weakness.  PSYCHIATRY: No anxiety or depression.   ROS  DRUG ALLERGIES:   Allergies  Allergen Reactions  . Alendronate Sodium     Leg tingling   . Hctz [Hydrochlorothiazide]     Dehydration/ syncope  . Hydrocodone     Syncope     VITALS:  Blood pressure (!) 208/70, pulse 74, temperature 99 F (37.2 C), temperature source Oral, resp. rate 18, height  (1.676 m), weight 49.3 kg (108 lb 9.6 oz), SpO2 97 %.  PHYSICAL EXAMINATION:  GENERAL:  83 y.o.-year-old Thin patient lying in the bed with no acute distress.  EYES: Pupils equal, round, reactive to light and accommodation. No scleral icterus. Extraocular muscles intact.  HEENT: Head atraumatic, normocephalic. Oropharynx and  nasopharynx clear.  NECK:  Supple, no jugular venous distention. No thyroid enlargement, no tenderness.  LUNGS: Normal breath sounds bilaterally, no wheezing, rales,rhonchi or crepitation. No use of accessory muscles of respiration.  CARDIOVASCULAR: S1, S2 normal. No murmurs, rubs, or gallops.  ABDOMEN: Soft, nontender, nondistended. Bowel sounds present. No organomegaly or mass.  EXTREMITIES: No pedal edema, cyanosis, or clubbing.  NEUROLOGIC: Cranial nerves II through XII are intact. Muscle strength 3-4/5 in all extremities. Sensation intact. Gait not checked.  PSYCHIATRIC: The patient is alert and oriented x 3.  SKIN: No obvious rash, lesion, or ulcer.   Physical Exam LABORATORY PANEL:   CBC  Recent Labs Lab 07/21/16 0307  WBC 9.1  HGB 12.7  HCT 36.8  PLT 252   ------------------------------------------------------------------------------------------------------------------  Chemistries   Recent Labs Lab 07/20/16 1218 07/21/16 0307  NA 139 138  K 3.8 3.7  CL 107 107  CO2 23 25  GLUCOSE 136* 112*  BUN 23* 18  CREATININE 1.14* 0.85  CALCIUM 9.9 9.8  AST 17  --   ALT 10*  --   ALKPHOS 47  --   BILITOT 0.9  --    ------------------------------------------------------------------------------------------------------------------  Cardiac Enzymes  Recent Labs Lab 07/20/16 2053 07/21/16 0307  TROPONINI <0.03 0.03*   ------------------------------------------------------------------------------------------------------------------  RADIOLOGY:  Ct Head Wo Contrast  Result Date: 07/20/2016 CLINICAL DATA:  Syncope today. EXAM: CT HEAD WITHOUT CONTRAST TECHNIQUE: Contiguous axial images were obtained from the base of the skull through the vertex without intravenous contrast. COMPARISON:  Head CT scan 11/07/2000. FINDINGS: Brain: No acute abnormality including  hemorrhage, infarct, mass lesion, mass effect, midline shift or abnormal extra-axial fluid collection. No  hydrocephalus or pneumocephalus. Vascular: Atherosclerosis noted. Skull: Intact. Sinuses/Orbits: No acute abnormality. Status post bilateral lens extraction. Other: None. IMPRESSION: No acute abnormality. Atherosclerosis. Electronically Signed   By: Drusilla Kanner M.D.   On: 07/20/2016 14:40   Ct Abdomen Pelvis W Contrast  Result Date: 07/20/2016 CLINICAL DATA:  Syncope at doctor's office. History of chronic constipation, hypertension. EXAM: CT ABDOMEN AND PELVIS WITH CONTRAST TECHNIQUE: Multidetector CT imaging of the abdomen and pelvis was performed using the standard protocol following bolus administration of intravenous contrast. CONTRAST:  75mL ISOVUE-300 IOPAMIDOL (ISOVUE-300) INJECTION 61% COMPARISON:  None. FINDINGS: Mild respiratory motion degraded examination. LOWER CHEST: Lung bases are clear. Included heart size is normal. No pericardial effusion. HEPATOBILIARY: Liver and gallbladder are normal. PANCREAS: Normal. SPLEEN: Normal. ADRENALS/URINARY TRACT: Kidneys are orthotopic, demonstrating symmetric enhancement. No nephrolithiasis, hydronephrosis or solid renal masses. Pelviectasis. Too small to characterize hypodensities in the kidneys bilaterally. RIGHT upper pole renal scarring. The unopacified ureters are normal in course and caliber. Delayed imaging through the kidneys demonstrates symmetric prompt contrast excretion within the proximal urinary collecting system. Urinary bladder is obscured by streak artifact. Normal adrenal glands. STOMACH/BOWEL: The stomach, small and large bowel are normal in course and caliber without inflammatory changes. Mild amount of retained large bowel stool. Small duodenum diverticulum. Normal appendix. VASCULAR/LYMPHATIC: Aortoiliac vessels are normal in course and caliber, severe calcific atherosclerosis. No lymphadenopathy by CT size criteria. REPRODUCTIVE: Obscured by streak artifact from hip arthroplasties. OTHER: No intraperitoneal free fluid or free air.  MUSCULOSKELETAL: Nonacute. Osteopenia. Multiple Tarlov cysts. Bilateral total hip arthroplasties result in streak artifact. Mid lumbar dextroscoliosis. Severe L2-3 degenerative disc. Grade 1 L2-3 retrolisthesis. Grade 1 L5-S1 anterolisthesis, no spondylolysis. Moderate to severe L5-S1 neural foraminal narrowing. Severe LEFT L2-3 neural foraminal narrowing. Moderate canal stenosis L4-5 and L5-S1. IMPRESSION: No acute intra-abdominal or pelvic process on this respiratory motion degraded examination. Severe atherosclerosis. Osteopenia. Electronically Signed   By: Awilda Metro M.D.   On: 07/20/2016 14:49   US Carotid Bilateral  Result Date: 07/21/2016 CLINICAL DATA:  CVA. EXAM: BILATERAL CAROTID DUPLEX ULTRASOUND TECHNIQUE: Wallace Cullens scale imaging, color Doppler and duplex ultrasound were performed of bilateral carotid and vertebral arteries in the neck. COMPARISON:  CT 07/20/2016.  CTA 03/11/2006 . FINDINGS: Criteria: Quantification of carotid stenosis is based on velocity parameters that correlate the residual internal carotid diameter with NASCET-based stenosis levels, using the diameter of the distal internal carotid lumen as the denominator for stenosis measurement. The following velocity measurements were obtained: RIGHT ICA:  246/43 cm/sec CCA:  90/6 cm/sec SYSTOLIC ICA/CCA RATIO:  2.5 DIASTOLIC ICA/CCA RATIO:  6.8 ECA:  192 cm/sec LEFT ICA:  123/19 cm/sec CCA:   101/10 cm/sec SYSTOLIC ICA/CCA RATIO:  1.2 DIASTOLIC ICA/CCA RATIO:  1.9 ECA:  125 cm/sec RIGHT CAROTID ARTERY: Moderate right carotid bifurcation and proximal ICA atherosclerotic vascular plaque with degree of stenosis 50-69% on today's exam . RIGHT VERTEBRAL ARTERY:  Patent with antegrade flow. LEFT CAROTID ARTERY: Mild to moderate moderate left common carotid, carotid bifurcation, proximal ICA atherosclerotic vascular plaque. Degree of stenosis less than 50%. LEFT VERTEBRAL ARTERY:  Patent with antegrade flow. IMPRESSION: 1. Moderate right carotid  bifurcation and proximal ICA atherosclerotic vascular plaque with degree of stenosis 50-69%. 2. Mild moderate left common carotid, carotid bifurcation, proximal ICA atherosclerotic vascular plaque. Degree of stenosis less 50%. 3. Vertebrals are patent antegrade flow. Electronically Signed   By: Maisie Fus  Register  On: 07/21/2016 06:25   Dg Chest Portable 1 View  Result Date: 07/20/2016 CLINICAL DATA:  83 y/o  F; syncopal episode. EXAM: PORTABLE CHEST 1 VIEW COMPARISON:  11/09/2010 chest radiograph. FINDINGS: Stable cardiac silhouette. Aortic atherosclerosis with calcification. Stable biapical pleuroparenchymal scarring. Stable nodularity of the right suprahilar region. No consolidation, effusion, or pneumothorax. Mild levocurvature of the lower thoracic spine. IMPRESSION: No acute pulmonary process identified.  Aortic atherosclerosis. Electronically Signed   By: Mitzi Hansen M.D.   On: 07/20/2016 13:36    ASSESSMENT AND PLAN:   Active Problems:   Syncope  83 year old female with a history of essential hypertension and hyperlipidemia who had a syncopal episode at PCP office.  1. Syncope due to orthostasis Continue IV fluids Telemetry monitoring Troponins 3- negative Checked orthostatics after hydration- stable. Carotid Doppler study reviewed, less than 70% stenosis, no further intervention required. No signs of infection.  2. Accelerated Essential hypertension: Continue ARB and add when necessary hydralazine  3. Hyperlipidemia: Continue statin  4. AKI: from poor po intake IVF and improved now,  5. Adult FTT; Dietary consult, PT and CM consult   Added megace. We will get speech and swallow evaluation as there is complain of dysphagia.  6, generalized weakness   Get physical therapy evaluation.       All the records are reviewed and case discussed with Care Management/Social Workerr. Management plans discussed with the patient, family and they are in  agreement.  CODE STATUS: DO NOT RESUSCITATE.   TOTAL TIME TAKING CARE OF THIS PATIENT: 35 minutes.     POSSIBLE D/C IN 1-2 DAYS, DEPENDING ON CLINICAL CONDITION.   Altamese Dilling M.D on 07/21/2016   Between 7am to 6pm - Pager - [phone]  After 6pm go to [email or web address] - password [email or web address]  [facility]  Office  [phone]  CC: Primary care physician; Roxy Manns, MD  Note: This dictation was prepared with Dragon dictation along with smaller phrase technology. Any transcriptional errors that result from this process are unintentional.

## 2016-07-21 NOTE — Assessment & Plan Note (Addendum)
Oral supplementation Lab Results  Component Value Date   VITAMINB12 >1500 (H) 02/16/2016  was asked to cut dose in 1/2 after that reading in the fall

## 2016-07-21 NOTE — Progress Notes (Signed)
Up with PT today,tolerating diet,said she feels better,orthostatics very unstable

## 2016-07-21 NOTE — Care Management (Signed)
Placed in observation after syncope event in office of her PCP. CM made attempt to explain observation notice and her sister wants to know "what can be done for patient to be admitted."  Patient very hard of hearing  even with hearing aids so she is not able to fully engage in conversation. Her sister Clarise Cruz is at bedside and unable to perform CM assessment due to questions about why patient is not admitted.  Asks that I speak with Inez Catalina- another sister about obs notice.  Inez Catalina will return to unit in the morning. CM will speak with Inez Catalina in the morning.  Discussed that patient case will be closely monitored by UR staff and will be changed to inpatient if inpatient criteria is met.

## 2016-07-21 NOTE — Assessment & Plan Note (Signed)
Chronic from OA - mobility impairment Controlled with tramadol (caution)

## 2016-07-21 NOTE — Assessment & Plan Note (Signed)
Lab Results  Component Value Date   HGBA1C 5.8 02/16/2016   Blood glucose was in nl range when checked by EMS

## 2016-07-21 NOTE — Assessment & Plan Note (Addendum)
During unresponsive event - bp held at 110/60 with a pulse (regular in the 60s) She was orthostatic upon EMS eval after this event

## 2016-07-21 NOTE — Evaluation (Signed)
Physical Therapy Evaluation Patient Details Name: [name] MRN: [id number] DOB: 23-Sep-1920 Today's Date: 07/21/2016   History of Present Illness  Pt is a 83 y.o.femalewith a known history of Essential hypertension and hyperlipidemia who had a syncopal episode at PCP office. Patient had a follow-up visit today with her PCP. Apparently over the past 2-3 weeks patient has had decreased by mouth intake and generalized weakness. Family was concerned and expressed their concern to primary care physician.  Patient was in PCP office when she had a syncopal episode. PCP was giving the patient lab work and apparently slumped over and was drooling for approximately 4-5 minutes she was unresponsive. When she came to she was back to her baseline. No seizure activity was reported. Patient had been in her usual state of health prior.  Assessment includes: Syncope due to orthostasis, AKI, FTT, and general weakness.     Clinical Impression  Pt presents with deficits in strength, transfers, mobility, gait, balance, and activity tolerance.  Pt required SBA and significant effort during sup to/from sit, CGA with transfers, and CGA with amb with RW a max of 20' before fatiguing and requiring return to supine.  Vitals room air:  Supine BP 162/65 mmHg, SpO2 97%, HR 104 bpm; Sitting BP 162/65 mmHg, SpO2 97%, HR 110 bpm.  Pt unable to remain standing long enough after amb to get standing BP.  Pt will benefit from PT services to address above deficits for decreased caregiver assistance.  Pt would benefit from a SNF setting upon discharge from acute care for safe return towards prior level of function for hopeful safe return home with decreased caregiver assistance.      Follow Up Recommendations SNF    Equipment Recommendations  None recommended by PT    Recommendations for Other Services       Precautions / Restrictions Precautions Precautions: Fall Restrictions Weight Bearing Restrictions: No       Mobility  Bed Mobility Overal bed mobility: Needs Assistance Bed Mobility: Supine to Sit;Sit to Supine     Supine to sit: Supervision Sit to supine: Supervision   General bed mobility comments: Effortful with sup to/from sit with extra time and effort required  Transfers Overall transfer level: Needs assistance Equipment used: Rolling walker (2 wheeled) Transfers: Sit to/from Stand Sit to Stand: Min guard         General transfer comment: Effortful but no assistance required  Ambulation/Gait Ambulation/Gait assistance: Min guard Ambulation Distance (Feet): 20 Feet Assistive device: Rolling walker (2 wheeled) Gait Pattern/deviations: Step-through pattern;Decreased step length - right;Decreased step length - left   Gait velocity interpretation: Below normal speed for age/gender General Gait Details: Slow, effortful cadence with pt unable to remain standing after short walk and returned to sitting.  Pt only able to remain sitting for a short period of time before needing to return to supine  Stairs            Wheelchair Mobility    Modified Rankin (Stroke Patients Only)       Balance Overall balance assessment: Needs assistance Sitting-balance support: No upper extremity supported;Feet supported Sitting balance-Leahy Scale: Fair     Standing balance support: Bilateral upper extremity supported Standing balance-Leahy Scale: Fair                               Pertinent Vitals/Pain Pain Assessment: No/denies pain    Home Living Family/patient expects to be discharged to::  Private residence Living Arrangements: Alone Available Help at Discharge: Family;Available PRN/intermittently Type of Home: House Home Access: Ramped entrance     Home Layout: One level Home Equipment: Walker - 2 wheels      Prior Function Level of Independence: Independent with assistive device(s)         Comments: Mod Ind amb with RW, Ind with ADLs, family  checks on pt intermittently     Hand Dominance   Dominant Hand: Left    Extremity/Trunk Assessment   Upper Extremity Assessment Upper Extremity Assessment: Generalized weakness    Lower Extremity Assessment Lower Extremity Assessment: Generalized weakness       Communication   Communication: HOH  Cognition Arousal/Alertness: Lethargic Behavior During Therapy: Flat affect Overall Cognitive Status: Within Functional Limits for tasks assessed                                        General Comments  Significant time educating pt/family on pt's current status and possible long term options if safe return home independently is not an option.      Exercises Total Joint Exercises Ankle Circles/Pumps: AROM;Both;5 reps;10 reps Quad Sets: Strengthening;5 reps;Both;10 reps Gluteal Sets: Strengthening;Both;10 reps Heel Slides: AAROM;Both;10 reps Hip ABduction/ADduction: AAROM;Both;10 reps Straight Leg Raises: AAROM;Both;10 reps Long Arc Quad: AROM;Both;5 reps Knee Flexion: AROM;Both;5 reps Other Exercises Other Exercises: Pt/family education on B APs, QS, and GS x 10 each 5-6x/day   Assessment/Plan    PT Assessment Patient needs continued PT services  PT Problem List Decreased strength;Decreased activity tolerance;Decreased balance;Decreased knowledge of use of DME;Decreased mobility       PT Treatment Interventions Gait training;Functional mobility training;Neuromuscular re-education;Balance training;DME instruction;Therapeutic activities;Therapeutic exercise;Patient/family education    PT Goals (Current goals can be found in the Care Plan section)  Acute Rehab PT Goals Patient Stated Goal: To return home PT Goal Formulation: With patient/family Time For Goal Achievement: 08/03/16 Potential to Achieve Goals: Fair    Frequency Min 2X/week   Barriers to discharge Decreased caregiver support      Co-evaluation               End of Session  Equipment Utilized During Treatment: Gait belt Activity Tolerance: Patient limited by fatigue Patient left: in bed;with bed alarm set;with family/visitor present;with call bell/phone within reach Nurse Communication: Mobility status PT Visit Diagnosis: Muscle weakness (generalized) (M62.81);Difficulty in walking, not elsewhere classified (R26.2)    Time: 2130-8657 PT Time Calculation (min) (ACUTE ONLY): 50 min   Charges:   PT Evaluation $PT Eval Moderate Complexity: 1 Procedure PT Treatments $Therapeutic Exercise: 8-22 mins $Therapeutic Activity: 8-22 mins   PT G Codes:   PT G-Codes **NOT FOR INPATIENT CLASS** Functional Assessment Tool Used: AM-PAC 6 Clicks Basic Mobility Functional Limitation: Mobility: Walking and moving around Mobility: Walking and Moving Around Current Status (Q4696): At least 40 percent but less than 60 percent impaired, limited or restricted Mobility: Walking and Moving Around Goal Status [phone]): At least 1 percent but less than 20 percent impaired, limited or restricted    D. Elly Modena PT, DPT 07/21/16, 4:23 PM

## 2016-07-22 DIAGNOSIS — Z5189 Encounter for other specified aftercare: Secondary | ICD-10-CM | POA: Diagnosis not present

## 2016-07-22 DIAGNOSIS — M6281 Muscle weakness (generalized): Secondary | ICD-10-CM | POA: Diagnosis not present

## 2016-07-22 DIAGNOSIS — R1312 Dysphagia, oropharyngeal phase: Secondary | ICD-10-CM | POA: Diagnosis not present

## 2016-07-22 DIAGNOSIS — I10 Essential (primary) hypertension: Secondary | ICD-10-CM | POA: Diagnosis present

## 2016-07-22 DIAGNOSIS — K5909 Other constipation: Secondary | ICD-10-CM | POA: Diagnosis present

## 2016-07-22 DIAGNOSIS — R627 Adult failure to thrive: Secondary | ICD-10-CM | POA: Diagnosis present

## 2016-07-22 DIAGNOSIS — I951 Orthostatic hypotension: Secondary | ICD-10-CM | POA: Diagnosis present

## 2016-07-22 DIAGNOSIS — H9190 Unspecified hearing loss, unspecified ear: Secondary | ICD-10-CM | POA: Diagnosis present

## 2016-07-22 DIAGNOSIS — Z8249 Family history of ischemic heart disease and other diseases of the circulatory system: Secondary | ICD-10-CM | POA: Diagnosis not present

## 2016-07-22 DIAGNOSIS — Z96649 Presence of unspecified artificial hip joint: Secondary | ICD-10-CM | POA: Diagnosis present

## 2016-07-22 DIAGNOSIS — R55 Syncope and collapse: Secondary | ICD-10-CM | POA: Diagnosis present

## 2016-07-22 DIAGNOSIS — R488 Other symbolic dysfunctions: Secondary | ICD-10-CM | POA: Diagnosis not present

## 2016-07-22 DIAGNOSIS — R2689 Other abnormalities of gait and mobility: Secondary | ICD-10-CM | POA: Diagnosis not present

## 2016-07-22 DIAGNOSIS — Z79891 Long term (current) use of opiate analgesic: Secondary | ICD-10-CM | POA: Diagnosis not present

## 2016-07-22 DIAGNOSIS — Z888 Allergy status to other drugs, medicaments and biological substances status: Secondary | ICD-10-CM | POA: Diagnosis not present

## 2016-07-22 DIAGNOSIS — N179 Acute kidney failure, unspecified: Secondary | ICD-10-CM | POA: Diagnosis present

## 2016-07-22 DIAGNOSIS — M1990 Unspecified osteoarthritis, unspecified site: Secondary | ICD-10-CM | POA: Diagnosis present

## 2016-07-22 DIAGNOSIS — R1310 Dysphagia, unspecified: Secondary | ICD-10-CM | POA: Diagnosis present

## 2016-07-22 DIAGNOSIS — Z66 Do not resuscitate: Secondary | ICD-10-CM | POA: Diagnosis present

## 2016-07-22 DIAGNOSIS — Z885 Allergy status to narcotic agent status: Secondary | ICD-10-CM | POA: Diagnosis not present

## 2016-07-22 DIAGNOSIS — M8580 Other specified disorders of bone density and structure, unspecified site: Secondary | ICD-10-CM | POA: Diagnosis present

## 2016-07-22 DIAGNOSIS — I4510 Unspecified right bundle-branch block: Secondary | ICD-10-CM | POA: Diagnosis present

## 2016-07-22 DIAGNOSIS — E785 Hyperlipidemia, unspecified: Secondary | ICD-10-CM | POA: Diagnosis present

## 2016-07-22 DIAGNOSIS — Z7982 Long term (current) use of aspirin: Secondary | ICD-10-CM | POA: Diagnosis not present

## 2016-07-22 DIAGNOSIS — I639 Cerebral infarction, unspecified: Secondary | ICD-10-CM | POA: Diagnosis not present

## 2016-07-22 DIAGNOSIS — R278 Other lack of coordination: Secondary | ICD-10-CM | POA: Diagnosis not present

## 2016-07-22 DIAGNOSIS — Z79899 Other long term (current) drug therapy: Secondary | ICD-10-CM | POA: Diagnosis not present

## 2016-07-22 DIAGNOSIS — Z7401 Bed confinement status: Secondary | ICD-10-CM | POA: Diagnosis not present

## 2016-07-22 DIAGNOSIS — I6529 Occlusion and stenosis of unspecified carotid artery: Secondary | ICD-10-CM | POA: Diagnosis present

## 2016-07-22 DIAGNOSIS — M1991 Primary osteoarthritis, unspecified site: Secondary | ICD-10-CM | POA: Diagnosis not present

## 2016-07-22 MED ORDER — MEGESTROL ACETATE 400 MG/10ML PO SUSP
400.0000 mg | Freq: Every day | ORAL | Status: DC
  Administered 2016-07-22 – 2016-07-25 (×4): 400 mg via ORAL
  Filled 2016-07-22 (×4): qty 10

## 2016-07-22 MED ORDER — DOCUSATE SODIUM 100 MG PO CAPS
100.0000 mg | ORAL_CAPSULE | Freq: Two times a day (BID) | ORAL | Status: DC
  Filled 2016-07-22: qty 1

## 2016-07-22 MED ORDER — DOCUSATE SODIUM 50 MG/5ML PO LIQD
100.0000 mg | Freq: Two times a day (BID) | ORAL | Status: DC
  Administered 2016-07-22 – 2016-07-25 (×6): 100 mg via ORAL
  Filled 2016-07-22 (×6): qty 10

## 2016-07-22 NOTE — Telephone Encounter (Signed)
[name] called back to make you aware pt was admitted as inpatient at [facility]. Pt is doing better. I read your msg back and Mrs. [name] said she appreciates you so much.

## 2016-07-22 NOTE — Telephone Encounter (Signed)
Good to know. thanks

## 2016-07-22 NOTE — Progress Notes (Signed)
[facility] [place] [facility]   PATIENT NAME: [name]    MR#:  [id number]  DATE OF BIRTH:  27-Aug-1920  SUBJECTIVE:  CHIEF COMPLAINT:   Chief Complaint  Patient presents with  . Loss of Consciousness     Decreased oral intake for last 2-3 weeks, has significant weakness in so taken to PMDs office yesterday, where she had an episode of unresponsiveness but she was conscious at that time and her vitals were stable. Found to have slight dehydration and malnutrition so admitted to medical service. on further questioning family also complains of her having choking symptoms with water or other liquids. Had drop in BP and rise in HR yesterday evening on orthostatic.  REVIEW OF SYSTEMS:  CONSTITUTIONAL: No fever, positive for fatigue or weakness.  EYES: No blurred or double vision.  EARS, NOSE, AND THROAT: No tinnitus or ear pain.  RESPIRATORY: No cough, shortness of breath, wheezing or hemoptysis.  CARDIOVASCULAR: No chest pain, orthopnea, edema.  GASTROINTESTINAL: No nausea, vomiting, diarrhea or abdominal pain.  GENITOURINARY: No dysuria, hematuria.  ENDOCRINE: No polyuria, nocturia,  HEMATOLOGY: No anemia, easy bruising or bleeding SKIN: No rash or lesion. MUSCULOSKELETAL: No joint pain or arthritis.   NEUROLOGIC: No tingling, numbness, weakness.  PSYCHIATRY: No anxiety or depression.   ROS  DRUG ALLERGIES:   Allergies  Allergen Reactions  . Alendronate Sodium     Leg tingling   . Hctz [Hydrochlorothiazide]     Dehydration/ syncope  . Hydrocodone     Syncope     VITALS:  Blood pressure (!) 174/55, pulse 78, temperature 97.5 F (36.4 C), temperature source Oral, resp. rate 16, height  (1.676 m), weight 49.3 kg (108 lb 9.6 oz), SpO2 98 %.  PHYSICAL EXAMINATION:  GENERAL:  83 y.o.-year-old Thin patient lying in the bed with no acute distress.  EYES: Pupils equal, round, reactive to light and accommodation. No scleral icterus. Extraocular  muscles intact.  HEENT: Head atraumatic, normocephalic. Oropharynx and nasopharynx clear.  NECK:  Supple, no jugular venous distention. No thyroid enlargement, no tenderness.  LUNGS: Normal breath sounds bilaterally, no wheezing, rales,rhonchi or crepitation. No use of accessory muscles of respiration.  CARDIOVASCULAR: S1, S2 normal. No murmurs, rubs, or gallops.  ABDOMEN: Soft, nontender, nondistended. Bowel sounds present. No organomegaly or mass.  EXTREMITIES: No pedal edema, cyanosis, or clubbing.  NEUROLOGIC: Cranial nerves II through XII are intact. Muscle strength 3-4/5 in all extremities. Sensation intact. Gait not checked.  PSYCHIATRIC: The patient is alert and oriented x 3.  SKIN: No obvious rash, lesion, or ulcer.   Physical Exam LABORATORY PANEL:   CBC  Recent Labs Lab 07/21/16 0307  WBC 9.1  HGB 12.7  HCT 36.8  PLT 252   ------------------------------------------------------------------------------------------------------------------  Chemistries   Recent Labs Lab 07/20/16 1218 07/21/16 0307  NA 139 138  K 3.8 3.7  CL 107 107  CO2 23 25  GLUCOSE 136* 112*  BUN 23* 18  CREATININE 1.14* 0.85  CALCIUM 9.9 9.8  AST 17  --   ALT 10*  --   ALKPHOS 47  --   BILITOT 0.9  --    ------------------------------------------------------------------------------------------------------------------  Cardiac Enzymes  Recent Labs Lab 07/20/16 2053 07/21/16 0307  TROPONINI <0.03 0.03*   ------------------------------------------------------------------------------------------------------------------  RADIOLOGY:  Ct Head Wo Contrast  Result Date: 07/20/2016 CLINICAL DATA:  Syncope today. EXAM: CT HEAD WITHOUT CONTRAST TECHNIQUE: Contiguous axial images were obtained from the base of the skull through the vertex without intravenous  contrast. COMPARISON:  Head CT scan 11/07/2000. FINDINGS: Brain: No acute abnormality including hemorrhage, infarct, mass lesion, mass  effect, midline shift or abnormal extra-axial fluid collection. No hydrocephalus or pneumocephalus. Vascular: Atherosclerosis noted. Skull: Intact. Sinuses/Orbits: No acute abnormality. Status post bilateral lens extraction. Other: None. IMPRESSION: No acute abnormality. Atherosclerosis. Electronically Signed   By: Drusilla Kanner M.D.   On: 07/20/2016 14:40   Ct Abdomen Pelvis W Contrast  Result Date: 07/20/2016 CLINICAL DATA:  Syncope at doctor's office. History of chronic constipation, hypertension. EXAM: CT ABDOMEN AND PELVIS WITH CONTRAST TECHNIQUE: Multidetector CT imaging of the abdomen and pelvis was performed using the standard protocol following bolus administration of intravenous contrast. CONTRAST:  75mL ISOVUE-300 IOPAMIDOL (ISOVUE-300) INJECTION 61% COMPARISON:  None. FINDINGS: Mild respiratory motion degraded examination. LOWER CHEST: Lung bases are clear. Included heart size is normal. No pericardial effusion. HEPATOBILIARY: Liver and gallbladder are normal. PANCREAS: Normal. SPLEEN: Normal. ADRENALS/URINARY TRACT: Kidneys are orthotopic, demonstrating symmetric enhancement. No nephrolithiasis, hydronephrosis or solid renal masses. Pelviectasis. Too small to characterize hypodensities in the kidneys bilaterally. RIGHT upper pole renal scarring. The unopacified ureters are normal in course and caliber. Delayed imaging through the kidneys demonstrates symmetric prompt contrast excretion within the proximal urinary collecting system. Urinary bladder is obscured by streak artifact. Normal adrenal glands. STOMACH/BOWEL: The stomach, small and large bowel are normal in course and caliber without inflammatory changes. Mild amount of retained large bowel stool. Small duodenum diverticulum. Normal appendix. VASCULAR/LYMPHATIC: Aortoiliac vessels are normal in course and caliber, severe calcific atherosclerosis. No lymphadenopathy by CT size criteria. REPRODUCTIVE: Obscured by streak artifact from hip  arthroplasties. OTHER: No intraperitoneal free fluid or free air. MUSCULOSKELETAL: Nonacute. Osteopenia. Multiple Tarlov cysts. Bilateral total hip arthroplasties result in streak artifact. Mid lumbar dextroscoliosis. Severe L2-3 degenerative disc. Grade 1 L2-3 retrolisthesis. Grade 1 L5-S1 anterolisthesis, no spondylolysis. Moderate to severe L5-S1 neural foraminal narrowing. Severe LEFT L2-3 neural foraminal narrowing. Moderate canal stenosis L4-5 and L5-S1. IMPRESSION: No acute intra-abdominal or pelvic process on this respiratory motion degraded examination. Severe atherosclerosis. Osteopenia. Electronically Signed   By: Awilda Metro M.D.   On: 07/20/2016 14:49   US Carotid Bilateral  Result Date: 07/21/2016 CLINICAL DATA:  CVA. EXAM: BILATERAL CAROTID DUPLEX ULTRASOUND TECHNIQUE: Wallace Cullens scale imaging, color Doppler and duplex ultrasound were performed of bilateral carotid and vertebral arteries in the neck. COMPARISON:  CT 07/20/2016.  CTA 03/11/2006 . FINDINGS: Criteria: Quantification of carotid stenosis is based on velocity parameters that correlate the residual internal carotid diameter with NASCET-based stenosis levels, using the diameter of the distal internal carotid lumen as the denominator for stenosis measurement. The following velocity measurements were obtained: RIGHT ICA:  246/43 cm/sec CCA:  90/6 cm/sec SYSTOLIC ICA/CCA RATIO:  2.5 DIASTOLIC ICA/CCA RATIO:  6.8 ECA:  192 cm/sec LEFT ICA:  123/19 cm/sec CCA:   101/10 cm/sec SYSTOLIC ICA/CCA RATIO:  1.2 DIASTOLIC ICA/CCA RATIO:  1.9 ECA:  125 cm/sec RIGHT CAROTID ARTERY: Moderate right carotid bifurcation and proximal ICA atherosclerotic vascular plaque with degree of stenosis 50-69% on today's exam . RIGHT VERTEBRAL ARTERY:  Patent with antegrade flow. LEFT CAROTID ARTERY: Mild to moderate moderate left common carotid, carotid bifurcation, proximal ICA atherosclerotic vascular plaque. Degree of stenosis less than 50%. LEFT VERTEBRAL ARTERY:   Patent with antegrade flow. IMPRESSION: 1. Moderate right carotid bifurcation and proximal ICA atherosclerotic vascular plaque with degree of stenosis 50-69%. 2. Mild moderate left common carotid, carotid bifurcation, proximal ICA atherosclerotic vascular plaque. Degree of stenosis less 50%. 3.  Vertebrals are patent antegrade flow. Electronically Signed   By: Maisie Fus  Register   On: 07/21/2016 06:25    ASSESSMENT AND PLAN:   Active Problems:   Syncope   Orthostatic hypotension  82 year old female with a history of essential hypertension and hyperlipidemia who had a syncopal episode at PCP office.  1. Syncope due to orthostasis Continue IV fluids Telemetry monitoring Troponins 3- negative Checked orthostatics after hydration- noted drop in BP and rise in HR- give TED hose. Carotid Doppler study reviewed, less than 70% stenosis, no further intervention required. No signs of infection.   Likely due to old age, advised pt to wait for few seconds after standing up- and allow re-adjusting of BP and HR.  2. Accelerated Essential hypertension: Continue ARB and add when necessary hydralazine  3. Hyperlipidemia: Continue statin  4. AKI: from poor po intake IVF and improved now,  5. Adult FTT; Dietary consult, PT and CM consult   Added megace.  get speech and swallow evaluation as there is complain of dysphagia.  6, generalized weakness   Need SNF per  physical therapy evaluation.       All the records are reviewed and case discussed with Care Management/Social Workerr. Management plans discussed with the patient, family and they are in agreement.  CODE STATUS: DO NOT RESUSCITATE.   TOTAL TIME TAKING CARE OF THIS PATIENT: 35 minutes.     POSSIBLE D/C IN 1-2 DAYS, DEPENDING ON CLINICAL CONDITION.   Altamese Dilling M.D on 07/22/2016   Between 7am to 6pm - Pager - [phone]  After 6pm go to [email or web address] - password [email or web address]  [facility]  Office   [phone]  CC: Primary care physician; Roxy Manns, MD  Note: This dictation was prepared with Dragon dictation along with smaller phrase technology. Any transcriptional errors that result from this process are unintentional.

## 2016-07-22 NOTE — Evaluation (Signed)
Clinical/Bedside Swallow Evaluation Patient Details  Name: [name] MRN: [id number] Date of Birth: 29-Nov-1920  Today's Date: 07/22/2016 Time: SLP Start Time (ACUTE ONLY): 1100 SLP Stop Time (ACUTE ONLY): 1200 SLP Time Calculation (min) (ACUTE ONLY): 60 min  Past Medical History:  Past Medical History:  Diagnosis Date  . Carotid stenosis   . Chronic constipation   . HLD (hyperlipidemia) 2002  . HTN (hypertension)   . Osteoarthritis   . Osteopenia    with arm fracture   Past Surgical History:  Past Surgical History:  Procedure Laterality Date  . Abd [place]  12/07   Negative AAA  . carotid doppler  11/07   50-79 % RICA  . carotid doppler     50-69 % RICA, lessa then 50% LICA-overall no change  . COLONOSCOPY  8/02   Diverticulosis  . DEXA  08/2002   Osteopenia  . DEXA  4/10   Osteopenia (worse at forearm T-1.98)-imp at LS -1.0  . TOTAL HIP ARTHROPLASTY  6/06  . TOTAL HIP ARTHROPLASTY  10/06   HPI:  Family is concerned about pt's reduced oral intake in the past ~6 weeks although she does eat banana pudding and continues to drink liquids such as coffee per report; "she likes sweet things".   Assessment / Plan / Recommendation Clinical Impression  Pt appears at reduced risk for aspiration when following aspiration precautions including NO Straws and taking small, single sips via CUP slowly. Pt does benefit from reduced distractions as well. She does require some support w/ tray setup and prep while eating in bed. Pt endorsed having less appetite "recently" but stated she does like the banana pudding "here". Pt is of advanced age (37 years) and was living alone prior per report. Discussed w/ family and pt that there are many factors which can impact a person's appetite and desire for eating. Due to pt having some difficulty swallowing pills this morning w/ NSG, recommend Pills Crushed in puree/ice cream at this time for easier, safer swallowing.  SLP Visit Diagnosis: Dysphagia,  oropharyngeal phase (R13.12)    Aspiration Risk   (reduced when following aspiration precautions)    Diet Recommendation  Dysphagia level 3(mech soft) w/ Thin liquids - NO STRAWS!  General aspiration precautions; tray setup at meals  Medication Administration: Crushed with puree    Other  Recommendations Recommended Consults:  (Dietician f/u) Oral Care Recommendations: Oral care BID;Staff/trained caregiver to provide oral care   Follow up Recommendations None      Frequency and Duration min 2x/week  1 week       Prognosis Prognosis for Safe Diet Advancement: Fair (-Good) Barriers to Reach Goals:  (older-elderly age)      Swallow Study   General Date of Onset: 07/20/16 HPI: Family is concerned about pt's reduced oral intake in the past ~6 weeks although she does eat banana pudding and continues to drink liquids such as coffee per report; "she likes sweet things". Type of Study: Bedside Swallow Evaluation Previous Swallow Assessment: none indicated Diet Prior to this Study: Regular;Thin liquids Temperature Spikes Noted: No (wbc 9.1; CXR: "no acute pulmonary process identified") Respiratory Status: Room air History of Recent Intubation: No Behavior/Cognition: Alert;Cooperative;Pleasant mood;Distractible;Requires cueing (significantly HOH!) Oral Cavity Assessment: Within Functional Limits Oral Care Completed by SLP: Recent completion by staff Oral Cavity - Dentition:  (Dentures: "does not take them out much") Vision: Functional for self-feeding Self-Feeding Abilities: Able to feed self;Needs set up Patient Positioning: Upright in bed Baseline Vocal Quality: Normal;Low  vocal intensity Volitional Cough: Strong Volitional Swallow: Able to elicit    Oral/Motor/Sensory Function Overall Oral Motor/Sensory Function: Within functional limits   Ice Chips Ice chips: Not tested   Thin Liquid Thin Liquid: Within functional limits Presentation: Cup;Self Fed (8 trials ) Other Comments:  "I need to go to the bathroom now"    Nectar Thick Nectar Thick Liquid: Not tested   Honey Thick Honey Thick Liquid: Not tested   Puree Puree: Within functional limits Presentation: Self Fed;Spoon (4 trials)   Solid   GO   Solid: Within functional limits Presentation: Spoon;Self Fed (2 trials from breakfast meal) Other Comments: "likes to have her meats cut"    Functional Assessment Tool Used: clinical judgement Functional Limitations: Swallowing Swallow Current Status (Z6109): At least 1 percent but less than 20 percent impaired, limited or restricted Swallow Goal Status [phone]): At least 1 percent but less than 20 percent impaired, limited or restricted Swallow Discharge Status [phone]): At least 1 percent but less than 20 percent impaired, limited or restricted     Jerilynn Som, MS, CCC-SLP Watson,Katherine 07/22/2016,4:52 PM

## 2016-07-22 NOTE — Progress Notes (Signed)
Patient was unable to swallow pill tonight with applesauce. Will suggest another form.

## 2016-07-22 NOTE — NC FL2 (Signed)
[place] MEDICAID FL2 LEVEL OF CARE SCREENING TOOL     IDENTIFICATION  Patient Name: [name] Birthdate: [birth year] Sex: female Admission Date (Current Location): 07/20/2016  [place] and [place] Number:  Chiropodist and Address:  [facility], [address], [place], [place] [address]      Provider Number: [id number]  Attending Physician Name and Address:  Altamese Dilling, MD  Relative Name and Phone Number:  Celene Skeen Sister [phone] or Marchelle Gearing [phone]     Current Level of Care: Hospital Recommended Level of Care: Skilled Nursing Facility Prior Approval Number:    Date Approved/Denied:   PASRR Number: [id number]  Discharge Plan: Home    Current Diagnoses: Patient Active Problem List   Diagnosis Date Noted  . Orthostatic hypotension 07/22/2016  . Fatigue 07/20/2016  . Mental status change 07/20/2016  . Syncope 07/20/2016  . Elevated TSH 02/11/2015  . Mobility impaired 06/18/2014  . Dystrophic nail 07/11/2013  . Seborrheic keratoses 07/11/2013  . Callus of foot 01/17/2013  . Encounter for Medicare annual wellness exam 07/10/2012  . Hyperglycemia 01/21/2012  . Leg pain, bilateral 10/15/2011  . Ankle edema 10/15/2011  . B12 deficiency 08/22/2008  . Osteopenia 07/09/2008  . Carotid stenosis 11/14/2006  . Hyperlipidemia LDL goal <130 06/09/2006  . Essential hypertension 06/09/2006  . CONSTIPATION, CHRONIC 06/09/2006  . OSTEOARTHRITIS 06/09/2006    Orientation RESPIRATION BLADDER Height & Weight     Self, Time, Situation, Place  Normal Continent Weight: 108 lb 9.6 oz (49.3 kg) Height:   (167.6 cm)  BEHAVIORAL SYMPTOMS/MOOD NEUROLOGICAL BOWEL NUTRITION STATUS      Continent Diet (Thin Fluid Consistency)  AMBULATORY STATUS COMMUNICATION OF NEEDS Skin   Limited Assist Verbally Normal                       Personal Care Assistance Level of Assistance  Bathing, Dressing, Feeding  Bathing Assistance: Limited assistance Feeding assistance: Limited assistance Dressing Assistance: Limited assistance     Functional Limitations Info  Sight, Hearing, Speech Sight Info: Adequate Hearing Info: Impaired Speech Info: Adequate    SPECIAL CARE FACTORS FREQUENCY  PT (By licensed PT)     PT Frequency: 5x a week              Contractures Contractures Info: Not present    Additional Factors Info  Code Status, Allergies Code Status Info: DNR Allergies Info: ALENDRONATE SODIUM, HCTZ HYDROCHLOROTHIAZIDE, HYDROCODONE           Current Medications (07/22/2016):  This is the current hospital active medication list Current Facility-Administered Medications  Medication Dose Route Frequency Provider Last Rate Last Dose  . acetaminophen (TYLENOL) tablet 650 mg  650 mg Oral Q6H PRN Adrian Saran, MD       Or  . acetaminophen (TYLENOL) suppository 650 mg  650 mg Rectal Q6H PRN Adrian Saran, MD      . aspirin EC tablet 81 mg  81 mg Oral Daily Adrian Saran, MD   81 mg at 07/22/16 0952  . calcium-vitamin D (OSCAL WITH D) 500-200 MG-UNIT per tablet 1 tablet  1 tablet Oral Daily Adrian Saran, MD   1 tablet at 07/22/16 0951  . enoxaparin (LOVENOX) injection 40 mg  40 mg Subcutaneous Q24H Altamese Dilling, MD   40 mg at 07/21/16 2138  . feeding supplement (ENSURE ENLIVE) (ENSURE ENLIVE) liquid 237 mL  237 mL Oral TID BM Altamese Dilling, MD   237 mL at 07/22/16  1610  . hydrALAZINE (APRESOLINE) injection 10 mg  10 mg Intravenous Q6H PRN Adrian Saran, MD   10 mg at 07/22/16 0423  . HYDROcodone-acetaminophen (NORCO/VICODIN) 5-325 MG per tablet 1-2 tablet  1-2 tablet Oral Q4H PRN Adrian Saran, MD      . irbesartan (AVAPRO) tablet 37.5 mg  37.5 mg Oral Daily Sital Mody, MD   37.5 mg at 07/22/16 0950  . megestrol (MEGACE) tablet 40 mg  40 mg Oral Daily Altamese Dilling, MD   40 mg at 07/22/16 0951  . multivitamin-lutein (OCUVITE-LUTEIN) capsule 1 capsule  1 capsule Oral BID Adrian Saran,  MD   1 capsule at 07/22/16 0951  . ondansetron (ZOFRAN) tablet 4 mg  4 mg Oral Q6H PRN Adrian Saran, MD       Or  . ondansetron (ZOFRAN) injection 4 mg  4 mg Intravenous Q6H PRN Sital Mody, MD      . pravastatin (PRAVACHOL) tablet 10 mg  10 mg Oral q1800 Adrian Saran, MD   10 mg at 07/21/16 1724  . senna-docusate (Senokot-S) tablet 1 tablet  1 tablet Oral QHS PRN Adrian Saran, MD   1 tablet at 07/20/16 1635  . sodium chloride flush (NS) 0.9 % injection 3 mL  3 mL Intravenous Q12H Adrian Saran, MD   3 mL at 07/22/16 0952  . vitamin B-12 (CYANOCOBALAMIN) tablet 1,000 mcg  1,000 mcg Oral Daily Adrian Saran, MD   1,000 mcg at 07/22/16 0951  . vitamin C (ASCORBIC ACID) tablet 500 mg  500 mg Oral Daily Adrian Saran, MD   500 mg at 07/22/16 0951  . vitamin E capsule 400 Units  400 Units Oral Daily Adrian Saran, MD   400 Units at 07/22/16 9604     Discharge Medications: Please see discharge summary for a list of discharge medications.  Relevant Imaging Results:  Relevant Lab Results:   Additional Information SSN [id number]  Darleene Cleaver, [place]

## 2016-07-22 NOTE — Care Management (Addendum)
There is documentation of issues with swallowing, orthostatic hypotension and tachycardia in this medicare observation patient.  Has received IV apresoline.  SLP consult pending.

## 2016-07-22 NOTE — Care Management (Signed)
Spoke with patient's sisters and brother in Social worker.  CM explained in specific detail medicare criteria in regards to medicare covered skilled nursing.  She is inpatient as of today.  She would have to meet medical necessity for continued inpatient stay of three nights  in order for medicare to cover skilled nursing facility.  CM relayed that at present time, attending has stated if there are no new issues that patient most likely will be medically stable for discharge within next 24 hours which would not meet medicare guidelines for skilled nursing.  Discussed patient could go to facility and pay privately, assisted living and pay privately, or home with home health SN PT Aide, etc covered by medicare.  CM emphasized that this would not be continuous in home care.

## 2016-07-23 LAB — BASIC METABOLIC PANEL
ANION GAP: 7 (ref 5–15)
BUN: 24 mg/dL — ABNORMAL HIGH (ref 6–20)
CHLORIDE: 107 mmol/L (ref 101–111)
CO2: 22 mmol/L (ref 22–32)
CREATININE: 0.81 mg/dL (ref 0.44–1.00)
Calcium: 9.7 mg/dL (ref 8.9–10.3)
GFR calc non Af Amer: >60 mL/min (ref >60)
Glucose, Bld: 163 mg/dL — ABNORMAL HIGH (ref 65–99)
Potassium: 3.7 mmol/L (ref 3.5–5.1)
SODIUM: 136 mmol/L (ref 135–145)

## 2016-07-23 LAB — MAGNESIUM: MAGNESIUM: 2.1 mg/dL (ref 1.7–2.4)

## 2016-07-23 MED ORDER — POLYETHYLENE GLYCOL 3350 17 G PO PACK: 17.0000 g | PACK | Freq: Every day | ORAL | Status: DC | PRN

## 2016-07-23 NOTE — Progress Notes (Signed)
MADE AWARE BY CCMD PATIENT HAVING MULTIPLE PVC'S AND BIGEMINY. DR. Madelon Lips PAGED TO MAKE AWARE. MD ORDERED STAT BMP AND MAGNESIUM

## 2016-07-23 NOTE — Clinical Social Work Placement (Signed)
   CLINICAL SOCIAL WORK PLACEMENT  NOTE  Date:  07/23/2016  Patient Details  Name: [name] MRN: [id number] Date of Birth: [birth year]  Clinical Social Work is seeking post-discharge placement for this patient at the Skilled  Nursing Facility level of care (*CSW will initial, date and re-position this form in  chart as items are completed):  Yes   Patient/family provided with [place] Clinical Social Work Department's list of facilities offering this level of care within the geographic area requested by the patient (or if unable, by the patient's family).  Yes   Patient/family informed of their freedom to choose among providers that offer the needed level of care, that participate in Medicare, Medicaid or managed care program needed by the patient, have an available bed and are willing to accept the patient.  Yes   Patient/family informed of [facility]'s ownership interest in [facility] and [facility], as well as of the fact that they are under no obligation to receive care at these facilities.  PASRR submitted to EDS on 07/23/16     PASRR number received on 07/22/16     Existing PASRR number confirmed on 07/22/16     FL2 transmitted to all facilities in geographic area requested by pt/family on       FL2 transmitted to all facilities within larger geographic area on       Patient informed that his/her managed care company has contracts with or will negotiate with certain facilities, including the following:            Patient/family informed of bed offers received.  Patient chooses bed at       Physician recommends and patient chooses bed at      Patient to be transferred to   on  .  Patient to be transferred to facility by       Patient family notified on   of transfer.  Name of family member notified:        PHYSICIAN Please prepare priority discharge summary, including medications, Please sign FL2     Additional Comment:     _______________________________________________ Darleene Cleaver, LCSWA 07/23/2016, 11:01 PM

## 2016-07-23 NOTE — Care Management Important Message (Signed)
Important Message  Patient Details  Name: [name] MRN: [id number] Date of Birth: 1920-10-15   Medicare Important Message Given:  Yes  Signed IM notice given    Eber Hong, RN 07/23/2016, 9:22 AM

## 2016-07-23 NOTE — Plan of Care (Signed)
Problem: Activity: Goal: Risk for activity intolerance will decrease Outcome: Not Progressing Patient has difficulty ambulating to [facility]. Patient has become very weak. Engage patient in more activity while awake.

## 2016-07-23 NOTE — Progress Notes (Signed)
[facility] -  at [facility]   PATIENT NAME: [name]    MR#:  [id number]  DATE OF BIRTH:  12-07-1920  SUBJECTIVE:  CHIEF COMPLAINT:   Chief Complaint  Patient presents with  . Loss of Consciousness     Decreased oral intake for last 2-3 weeks, has significant weakness in so taken to PMDs office yesterday, where she had an episode of unresponsiveness but she was conscious at that time and her vitals were stable. Found to have slight dehydration and malnutrition so admitted to medical service. on further questioning family also complains of her having choking symptoms with water or other liquids. Had drop in BP and rise in HR on orthostatic.  Not much eating, but likes ensure as per family.  REVIEW OF SYSTEMS:  CONSTITUTIONAL: No fever, positive for fatigue or weakness.  EYES: No blurred or double vision.  EARS, NOSE, AND THROAT: No tinnitus or ear pain.  RESPIRATORY: No cough, shortness of breath, wheezing or hemoptysis.  CARDIOVASCULAR: No chest pain, orthopnea, edema.  GASTROINTESTINAL: No nausea, vomiting, diarrhea or abdominal pain.  GENITOURINARY: No dysuria, hematuria.  ENDOCRINE: No polyuria, nocturia,  HEMATOLOGY: No anemia, easy bruising or bleeding SKIN: No rash or lesion. MUSCULOSKELETAL: No joint pain or arthritis.   NEUROLOGIC: No tingling, numbness, weakness.  PSYCHIATRY: No anxiety or depression.   ROS  DRUG ALLERGIES:   Allergies  Allergen Reactions  . Alendronate Sodium     Leg tingling   . Hctz [Hydrochlorothiazide]     Dehydration/ syncope  . Hydrocodone     Syncope     VITALS:  Blood pressure (!) 187/77, pulse 99, temperature 98 F (36.7 C), temperature source Oral, resp. rate 15, height  (1.676 m), weight 49.3 kg (108 lb 9.6 oz), SpO2 99 %.  PHYSICAL EXAMINATION:  GENERAL:  83 y.o.-year-old Thin patient lying in the bed with no acute distress.  EYES: Pupils equal, round, reactive to light and accommodation.  No scleral icterus. Extraocular muscles intact.  HEENT: Head atraumatic, normocephalic. Oropharynx and nasopharynx clear.  NECK:  Supple, no jugular venous distention. No thyroid enlargement, no tenderness.  LUNGS: Normal breath sounds bilaterally, no wheezing, rales,rhonchi or crepitation. No use of accessory muscles of respiration.  CARDIOVASCULAR: S1, S2 normal. No murmurs, rubs, or gallops.  ABDOMEN: Soft, nontender, nondistended. Bowel sounds present. No organomegaly or mass.  EXTREMITIES: No pedal edema, cyanosis, or clubbing.  NEUROLOGIC: Cranial nerves II through XII are intact. Muscle strength 3-4/5 in all extremities. Sensation intact. Gait not checked.  PSYCHIATRIC: The patient is alert and oriented x 3.  SKIN: No obvious rash, lesion, or ulcer.   Physical Exam LABORATORY PANEL:   CBC  Recent Labs Lab 07/21/16 0307  WBC 9.1  HGB 12.7  HCT 36.8  PLT 252   ------------------------------------------------------------------------------------------------------------------  Chemistries   Recent Labs Lab 07/20/16 1218 07/21/16 0307  NA 139 138  K 3.8 3.7  CL 107 107  CO2 23 25  GLUCOSE 136* 112*  BUN 23* 18  CREATININE 1.14* 0.85  CALCIUM 9.9 9.8  AST 17  --   ALT 10*  --   ALKPHOS 47  --   BILITOT 0.9  --    ------------------------------------------------------------------------------------------------------------------  Cardiac Enzymes  Recent Labs Lab 07/20/16 2053 07/21/16 0307  TROPONINI <0.03 0.03*   ------------------------------------------------------------------------------------------------------------------  RADIOLOGY:  No results found.  ASSESSMENT AND PLAN:   Active Problems:   Syncope   Orthostatic hypotension  83 year old female with a history of essential hypertension  and hyperlipidemia who had a syncopal episode at PCP office.  1. Syncope due to orthostasis Continue IV fluids Telemetry monitoring Troponins 3-  negative Checked orthostatics after hydration- noted drop in BP and rise in HR- give TED hose. Carotid Doppler study reviewed, less than 70% stenosis, no further intervention required. No signs of infection.   Likely due to old age, advised pt to wait for few seconds after standing up- and allow re-adjusting of BP and HR.   Still have drop in BP.   2. Accelerated Essential hypertension: Continue ARB and add when necessary hydralazine   Systolic BP still runs high, but we will allow that - as she have orthostatic drop in BP.  3. Hyperlipidemia: Continue statin  4. AKI: from poor po intake IVF and improved now,  5. Adult FTT; Dietary consult, PT and CM consult   Added megace.    speech and swallow evaluation as there is complain of dysphagia.   Ordered Dysphagia 3 diet.  6, generalized weakness   Need SNF per  physical therapy evaluation.   Today Cm told, pt is refusing for SNF, her sisters are trying to convince her.   AS per nurse, she needed 2 person assistance to stand up from bed.   All the records are reviewed and case discussed with Care Management/Social Workerr. Management plans discussed with the patient, family and they are in agreement.  CODE STATUS: DO NOT RESUSCITATE.   TOTAL TIME TAKING CARE OF THIS PATIENT: 35 minutes.   POSSIBLE D/C IN 1-2 DAYS, DEPENDING ON CLINICAL CONDITION.   Altamese Dilling M.D on 07/23/2016   Between 7am to 6pm - Pager - [phone]  After 6pm go to [email or web address] - password [email or web address]  [facility]  Office  [phone]  CC: Primary care physician; Roxy Manns, MD  Note: This dictation was prepared with Dragon dictation along with smaller phrase technology. Any transcriptional errors that result from this process are unintentional.

## 2016-07-23 NOTE — Clinical Social Work Note (Signed)
Clinical Social Work Assessment  Patient Details  Name: [name] MRN: [id number] Date of Birth: 1920-12-29  Date of referral:  07/23/16               Reason for consult:  Facility Placement                Permission sought to share information with:  Facility Medical sales representative, Family Supports Permission granted to share information::  Yes, Verbal Permission Granted  Name::     Carter,Betty Sister [phone] or Marchelle Gearing [phone]   Agency::  SNF admissions  Relationship::     Contact Information:     Housing/Transportation Living arrangements for the past 2 months:  Single Family Home Source of Information:  Other (Comment Required) (Patient's sister) Patient Interpreter Needed:  None Criminal Activity/Legal Involvement Pertinent to Current Situation/Hospitalization:  No - Comment as needed Significant Relationships:  Other Family Members Lives with:  Self Do you feel safe going back to the place where you live?  No Need for family participation in patient care:  Yes (Comment)  Care giving concerns:  Patient's family feels she needs some rehab before she is able to return back home.  Patient does not think so, but she has agreed to go to SNF for short term rehab.   Social Worker assessment / plan:  Patient is a 83 year old female who is alert and oriented x4, but hard of hearing.  Patient's sister Huntley Dec was at bedside, CSW completed assessment by speaking with her.  Patient lives alone and is usually pretty active.  Patient still cook some of her own meals, and she is able to lock the door and stay safe.  Patient intially said she wanted to go back home with home health, but after speaking with her sisters she has agreed to go to SNF for short term rehab.  Patient has not had a 3 day qualifying stay, CSW explained the process that she will have to pay privately if she has not had her qualifying stay, family is okay with it.  Patient's family was explained how CSW  will find a SNF and then give them options based on availability.  CSW was given permission to begin bed search in [place].  Employment status:  Retired Health and safety inspector:  Medicare PT Recommendations:  Skilled Nursing Facility Information / Referral to community resources:  Skilled Nursing Facility  Patient/Family's Response to care:  Patient and family in agreement to going to SNF for short term rehab.  Patient/Family's Understanding of and Emotional Response to Diagnosis, Current Treatment, and Prognosis:  Patient is hopeful that she will not have to be in rehab very long.  Emotional Assessment Appearance:  Appears stated age Attitude/Demeanor/Rapport:    Affect (typically observed):  Appropriate, Pleasant Orientation:  Oriented to Self, Oriented to Place, Oriented to  Time Alcohol / Substance use:  Not Applicable Psych involvement (Current and /or in the community):  No (Comment)  Discharge Needs  Concerns to be addressed:  Lack of Support Readmission within the last 30 days:  No Current discharge risk:  Lives alone, Lack of support system Barriers to Discharge:  Continued Medical Work up   [place] Constable 07/23/2016, 10:22 PM

## 2016-07-23 NOTE — Care Management (Signed)
Patient remains orthostatic.  Physical therapy had recommending skilled nursing but patient says she wants to return home.  CM made a heads up referral to [facility] PT Aide and SW as there was no agency preference when discussed with sisters yesterday.  CSW will also anticipate skilled nursing should patient qualify and agree.

## 2016-07-24 LAB — CBC
HEMATOCRIT: 39.9 % (ref 35.0–47.0)
HEMOGLOBIN: 13.7 g/dL (ref 12.0–16.0)
MCH: 32.5 pg (ref 26.0–34.0)
MCHC: 34.2 g/dL (ref 32.0–36.0)
MCV: 95.1 fL (ref 80.0–100.0)
Platelets: 264 10*3/uL (ref 150–440)
RBC: 4.2 MIL/uL (ref 3.80–5.20)
RDW: 14 % (ref 11.5–14.5)
WBC: 9.2 10*3/uL (ref 3.6–11.0)

## 2016-07-24 LAB — BASIC METABOLIC PANEL
ANION GAP: 6 (ref 5–15)
BUN: 21 mg/dL — ABNORMAL HIGH (ref 6–20)
CO2: 22 mmol/L (ref 22–32)
Calcium: 9.3 mg/dL (ref 8.9–10.3)
Chloride: 108 mmol/L (ref 101–111)
Creatinine, Ser: 0.79 mg/dL (ref 0.44–1.00)
GFR calc Af Amer: >60 mL/min (ref >60)
Glucose, Bld: 111 mg/dL — ABNORMAL HIGH (ref 65–99)
POTASSIUM: 3.9 mmol/L (ref 3.5–5.1)
Sodium: 136 mmol/L (ref 135–145)

## 2016-07-24 NOTE — Plan of Care (Signed)
Problem: Physical Regulation: Goal: Ability to maintain clinical measurements within normal limits will improve Outcome: Not Progressing B.P. remains high. Physician aware. Jari Favre [facility]

## 2016-07-24 NOTE — Clinical Social Work Note (Addendum)
CSW made bed offers to the patient's sister, [name]. [name] indicated that she will give a decision once she discusses with her sisters today. CSW advised the patient's sister that dc will most likely be tomorrow. CSW will con't to follow.  Patient will be able to dc on Sunday, 4/29 to [facility] as chosen by the family and patient. The facility requested that a family member be present when she arrives to facilitate document signing. CSW informed family of this. CSW will cont' to follow.  Argentina Ponder, MSW, Theresia Majors [phone]

## 2016-07-24 NOTE — Progress Notes (Signed)
Physical Therapy Treatment Patient Details Name: [name] MRN: [id number] DOB: May 07, 1920 Today's Date: 07/24/2016    History of Present Illness Pt is a 83 y.o.femalewith a known history of Essential hypertension and hyperlipidemia who had a syncopal episode at PCP office. Patient had a follow-up visit today with her PCP. Apparently over the past 2-3 weeks patient has had decreased by mouth intake and generalized weakness. Family was concerned and expressed their concern to primary care physician.  Patient was in PCP office when she had a syncopal episode. PCP was giving the patient lab work and apparently slumped over and was drooling for approximately 4-5 minutes she was unresponsive. When she came to she was back to her baseline. No seizure activity was reported. Patient had been in her usual state of health prior.  Assessment includes: Syncope due to orthostasis, AKI, FTT, and general weakness.     PT Comments    Pt agreeable to PT; difficulty communicating due to pt being extremely hard of hearing. Pt denies pain. Pt participates in supine bed exercises with increased instruction, verbal and tactile cueing required due to difficulty hearing. Pt received breakfast tray and wishes to eat; therefore, no further PT attempted to edge of bed/ambulation. Pt did not wish to sit in recliner for breakfast. Continue PT to progress strength and endurance to improve functional mobility.    Follow Up Recommendations  SNF     Equipment Recommendations  None recommended by PT    Recommendations for Other Services       Precautions / Restrictions Precautions Precautions: Fall Restrictions Weight Bearing Restrictions: No    Mobility  Bed Mobility               General bed mobility comments: Not tested; pt received breakfast try and wished to eat  Transfers                    Ambulation/Gait                 Stairs            Wheelchair Mobility     Modified Rankin (Stroke Patients Only)       Balance                                            Cognition Arousal/Alertness: Awake/alert Behavior During Therapy: WFL for tasks assessed/performed Overall Cognitive Status: Within Functional Limits for tasks assessed                                 General Comments: Very HOH; difficulty communicating due to hearing      Exercises General Exercises - Lower Extremity Ankle Circles/Pumps: AROM;Both;20 reps;Supine Quad Sets: Strengthening;Both;Supine;15 reps Gluteal Sets: Strengthening;Both;15 reps;Supine Short Arc Quad: AAROM;Both;15 reps;Supine Heel Slides: AAROM;Both;15 reps;Supine Hip ABduction/ADduction: AAROM;Both;15 reps;Supine Straight Leg Raises: AAROM;Both;5 reps;Supine (2 sets)    General Comments        Pertinent Vitals/Pain Pain Assessment: No/denies pain    Home Living                      Prior Function            PT Goals (current goals can now be found in the care plan section) Progress towards PT goals: Progressing toward  goals    Frequency    Min 2X/week      PT Plan Current plan remains appropriate    Co-evaluation             End of Session   Activity Tolerance: Patient limited by fatigue;Other (comment)     PT Visit Diagnosis: Muscle weakness (generalized) (M62.81);Difficulty in walking, not elsewhere classified (R26.2)     Time: 1610-9604 PT Time Calculation (min) (ACUTE ONLY): 28 min  Charges:  $Therapeutic Exercise: 23-37 mins                    G Codes:        Scot Dock, PTA 07/24/2016, 10:08 AM

## 2016-07-24 NOTE — Progress Notes (Signed)
[employer] [facility] - [place] at [facility]   PATIENT NAME: [name]    MR#:  [id number]  DATE OF BIRTH:  April 25, 1920  SUBJECTIVE:   Patient here due to syncope secondary to orthostatic hypotension. Much improved with IV fluids. Seen by physical therapy and they recommended short-term rehabilitation and awaiting placement. Patient's sister is at bedside. No other acute events overnight, no complaints presently. Very hard of hearing.  REVIEW OF SYSTEMS:    Review of Systems  Constitutional: Negative for chills and fever.  HENT: Negative for congestion and tinnitus.   Eyes: Negative for blurred vision and double vision.  Respiratory: Negative for cough, shortness of breath and wheezing.   Cardiovascular: Negative for chest pain, orthopnea and PND.  Gastrointestinal: Negative for abdominal pain, diarrhea, nausea and vomiting.  Genitourinary: Negative for dysuria and hematuria.  Neurological: Negative for dizziness, sensory change and focal weakness.  All other systems reviewed and are negative.   Nutrition: Dysphagia III Tolerating Diet: Yes Tolerating PT: Eval noted.    DRUG ALLERGIES:   Allergies  Allergen Reactions  . Alendronate Sodium     Leg tingling   . Hctz [Hydrochlorothiazide]     Dehydration/ syncope  . Hydrocodone     Syncope     VITALS:  Blood pressure (!) 178/68, pulse 83, temperature 97.9 F (36.6 C), resp. rate 16, height  (1.676 m), weight 49.3 kg (108 lb 9.6 oz), SpO2 97 %.  PHYSICAL EXAMINATION:   Physical Exam  GENERAL:  83 y.o.-year-old patient lying in bed in no acute distress.  EYES: Pupils equal, round, reactive to light and accommodation. No scleral icterus. Extraocular muscles intact.  HEENT: Head atraumatic, normocephalic. Oropharynx and nasopharynx clear. Very hard of hearing. NECK:  Supple, no jugular venous distention. No thyroid enlargement, no tenderness.  LUNGS: Normal breath sounds bilaterally, no wheezing, rales,  rhonchi. No use of accessory muscles of respiration.  CARDIOVASCULAR: S1, S2 normal. No murmurs, rubs, or gallops.  ABDOMEN: Soft, nontender, nondistended. Bowel sounds present. No organomegaly or mass.  EXTREMITIES: No cyanosis, clubbing or edema b/l.    NEUROLOGIC: Cranial nerves II through XII are intact. No focal Motor or sensory deficits b/l.   PSYCHIATRIC: The patient is alert and oriented x 3.  SKIN: No obvious rash, lesion, or ulcer.    LABORATORY PANEL:   CBC  Recent Labs Lab 07/24/16 0439  WBC 9.2  HGB 13.7  HCT 39.9  PLT 264   ------------------------------------------------------------------------------------------------------------------  Chemistries   Recent Labs Lab 07/20/16 1218  07/23/16 1320 07/24/16 0439  NA 139  < > 136 136  K 3.8  < > 3.7 3.9  CL 107  < > 107 108  CO2 23  < > 22 22  GLUCOSE 136*  < > 163* 111*  BUN 23*  < > 24* 21*  CREATININE 1.14*  < > 0.81 0.79  CALCIUM 9.9  < > 9.7 9.3  MG  --   --  2.1  --   AST 17  --   --   --   ALT 10*  --   --   --   ALKPHOS 47  --   --   --   BILITOT 0.9  --   --   --   < > = values in this interval not displayed. ------------------------------------------------------------------------------------------------------------------  Cardiac Enzymes  Recent Labs Lab 07/21/16 0307  TROPONINI 0.03*   ------------------------------------------------------------------------------------------------------------------  RADIOLOGY:  No results found.   ASSESSMENT AND PLAN:  83 year old female with a history of essential hypertension and hyperlipidemia who had a syncopal episode at PCP office.  1. Syncope - due to orthostasis - improved w/ IV fluids.  CE X have been (-).   - Carotid doppler (-) for hemodynamically significant stenosis. Marland Kitchen   2. Accelerated Essential hypertension: Continue ARB and add when necessary hydralazine - tolerate some element of HTN given her orthostasis.   3.  Hyperlipidemia: Continue Pravachol  4. AKI - resolved w/ IV fluids.   5. Adult FTT; Dietary consult, PT and CM consult - cont. Megace, cont. Dysphagia III diet.  - seen by PT and they recommend SNF and will discharge there tomorrow.   6. generalized weakness-due to orthostasis, deconditioning. -seen by physical therapy, theyrecommended short-term rehabilitation. And plant to discharge to rehabilitation tomorrow.      All the records are reviewed and case discussed with Care Management/Social Worker. Management plans discussed with the patient, family and they are in agreement.  CODE STATUS: DNR  DVT Prophylaxis: Lovenox  TOTAL TIME TAKING CARE OF THIS PATIENT: 25 minutes.   POSSIBLE D/C IN 1-2 DAYS, DEPENDING ON CLINICAL CONDITION.   Houston Siren M.D on 07/24/2016 at 2:22 PM  Between 7am to 6pm - Pager - [phone]  After 6pm go to [email or web address] - Scientist, research (life sciences) [facility]  Office  [phone]  CC: Primary care physician; Roxy Manns, MD

## 2016-07-24 NOTE — Progress Notes (Signed)
Per Dr. Cherlynn Kaiser, it's OK for patient to go without IV access for at least the next 24 hours, with anticipated d/c tomorrow AM. [name]

## 2016-07-25 DIAGNOSIS — R55 Syncope and collapse: Secondary | ICD-10-CM | POA: Diagnosis not present

## 2016-07-25 DIAGNOSIS — F0390 Unspecified dementia without behavioral disturbance: Secondary | ICD-10-CM | POA: Diagnosis not present

## 2016-07-25 DIAGNOSIS — K5900 Constipation, unspecified: Secondary | ICD-10-CM | POA: Diagnosis not present

## 2016-07-25 DIAGNOSIS — R488 Other symbolic dysfunctions: Secondary | ICD-10-CM | POA: Diagnosis not present

## 2016-07-25 DIAGNOSIS — M1990 Unspecified osteoarthritis, unspecified site: Secondary | ICD-10-CM | POA: Diagnosis not present

## 2016-07-25 DIAGNOSIS — R278 Other lack of coordination: Secondary | ICD-10-CM | POA: Diagnosis not present

## 2016-07-25 DIAGNOSIS — E785 Hyperlipidemia, unspecified: Secondary | ICD-10-CM | POA: Diagnosis not present

## 2016-07-25 DIAGNOSIS — Z5189 Encounter for other specified aftercare: Secondary | ICD-10-CM | POA: Diagnosis not present

## 2016-07-25 DIAGNOSIS — M6281 Muscle weakness (generalized): Secondary | ICD-10-CM | POA: Diagnosis not present

## 2016-07-25 DIAGNOSIS — R627 Adult failure to thrive: Secondary | ICD-10-CM | POA: Diagnosis not present

## 2016-07-25 DIAGNOSIS — R2689 Other abnormalities of gait and mobility: Secondary | ICD-10-CM | POA: Diagnosis not present

## 2016-07-25 DIAGNOSIS — E43 Unspecified severe protein-calorie malnutrition: Secondary | ICD-10-CM | POA: Diagnosis not present

## 2016-07-25 DIAGNOSIS — R1312 Dysphagia, oropharyngeal phase: Secondary | ICD-10-CM | POA: Diagnosis not present

## 2016-07-25 DIAGNOSIS — N179 Acute kidney failure, unspecified: Secondary | ICD-10-CM | POA: Diagnosis not present

## 2016-07-25 DIAGNOSIS — Z7401 Bed confinement status: Secondary | ICD-10-CM | POA: Diagnosis not present

## 2016-07-25 DIAGNOSIS — R531 Weakness: Secondary | ICD-10-CM | POA: Diagnosis not present

## 2016-07-25 DIAGNOSIS — I639 Cerebral infarction, unspecified: Secondary | ICD-10-CM | POA: Diagnosis not present

## 2016-07-25 DIAGNOSIS — I10 Essential (primary) hypertension: Secondary | ICD-10-CM | POA: Diagnosis not present

## 2016-07-25 DIAGNOSIS — R1310 Dysphagia, unspecified: Secondary | ICD-10-CM | POA: Diagnosis not present

## 2016-07-25 DIAGNOSIS — I951 Orthostatic hypotension: Secondary | ICD-10-CM | POA: Diagnosis not present

## 2016-07-25 DIAGNOSIS — M1991 Primary osteoarthritis, unspecified site: Secondary | ICD-10-CM | POA: Diagnosis not present

## 2016-07-25 MED ORDER — TRAMADOL HCL 50 MG PO TABS: 50.0000 mg | ORAL_TABLET | Freq: Four times a day (QID) | ORAL | 0 refills | Status: DC | PRN

## 2016-07-25 NOTE — Discharge Summary (Signed)
[employer] [facility] [place] at [facility] ([place])   PATIENT NAME: [name]    MR#:  [id number]  DATE OF BIRTH:  1921/02/16  DATE OF ADMISSION:  07/20/2016 ADMITTING PHYSICIAN: Adrian Saran, MD  DATE OF DISCHARGE: 07/25/2016  PRIMARY CARE PHYSICIAN: Roxy Manns, MD    ADMISSION DIAGNOSIS:  Syncope and collapse [R55] Adult failure to thrive [R62.7] Cerebral infarction [facility]) [I63.9] CVA (cerebral infarction) [I63.9]  DISCHARGE DIAGNOSIS:  Active Problems:   Syncope   Orthostatic hypotension   SECONDARY DIAGNOSIS:   Past Medical History:  Diagnosis Date  . Carotid stenosis   . Chronic constipation   . HLD (hyperlipidemia) 2002  . HTN (hypertension)   . Osteoarthritis   . Osteopenia    with arm fracture    HOSPITAL COURSE:   83 year old female with a history of essential hypertension and hyperlipidemia who had a syncopal episode at PCP office.  1. Syncope - due to orthostasis - improved w/ IV fluids.  Pt. Was observed on tele and had no alarms on tele.  - CE X have been (-).   - Carotid doppler (-) for hemodynamically significant stenosis.    2. Accelerated Essential hypertension: BP improved w/ some PRN hydralazine.  - pt. Will cont. Her Benicar.  - tolerate some element of HTN given her orthostasis.   3. Hyperlipidemia: she will Continue Pravachol  4. AKI - this has improved and resolved w/ IV fluids and Cr. Back to baseline  5. Adult FTT - seen by Dietary consult, PT and CM consult - cont. Megace, cont. Dysphagia III diet.  - seen by PT and they recommend SNF and being discharged there presently.    6. generalized weakness-due to orthostasis, deconditioning. -seen by physical therapy, they recommended short-term rehabilitation and pt. Is being discharged there presently.   DISCHARGE CONDITIONS:   Stable.   CONSULTS OBTAINED:    DRUG ALLERGIES:   Allergies  Allergen Reactions  . Alendronate Sodium     Leg tingling   . Hctz  [Hydrochlorothiazide]     Dehydration/ syncope  . Hydrocodone     Syncope     DISCHARGE MEDICATIONS:   Allergies as of 07/25/2016      Reactions   Alendronate Sodium    Leg tingling    Hctz [hydrochlorothiazide]    Dehydration/ syncope   Hydrocodone    Syncope      Medication List    STOP taking these medications   traMADol 50 MG tablet Commonly known as:  ULTRAM     TAKE these medications   aspirin 81 MG tablet Take 81 mg by mouth daily.   CALCIUM+D3 600-800 MG-UNIT Tabs Generic drug:  Calcium Carb-Cholecalciferol Take 1 tablet by mouth daily.   docusate sodium 50 MG capsule Commonly known as:  COLACE Take 50-100 mg by mouth as needed.   FISH OIL PO Take 1 capsule by mouth 2 (two) times daily.   ibuprofen 200 MG tablet Commonly known as:  ADVIL,MOTRIN Take 200 mg by mouth as needed for pain.   lovastatin 20 MG tablet Commonly known as:  MEVACOR Take 1 tablet (20 mg total) by mouth daily.   olmesartan 40 MG tablet Commonly known as:  BENICAR Take 1 tablet (40 mg total) by mouth daily.   PRESERVISION AREDS PO Take 1 tablet by mouth 2 (two) times daily.   vitamin B-12 1000 MCG tablet Commonly known as:  CYANOCOBALAMIN Take 1,000 mcg by mouth daily.   VITAMIN C PO Take 500 mg by  mouth daily.   vitamin E 400 UNIT capsule Take 400 Units by mouth daily.         DISCHARGE INSTRUCTIONS:   DIET:  Cardiac diet  Dysphagia III with thin liquids  DISCHARGE CONDITION:  Stable  ACTIVITY:  Activity as tolerated  OXYGEN:  Home Oxygen: No.   Oxygen Delivery: room air  DISCHARGE LOCATION:  nursing home   If you experience worsening of your admission symptoms, develop shortness of breath, life threatening emergency, suicidal or homicidal thoughts you must seek medical attention immediately by calling 911 or calling your MD immediately  if symptoms less severe.  You Must read complete instructions/literature along with all the possible adverse  reactions/side effects for all the Medicines you take and that have been prescribed to you. Take any new Medicines after you have completely understood and accpet all the possible adverse reactions/side effects.   Please note  You were cared for by a hospitalist during your hospital stay. If you have any questions about your discharge medications or the care you received while you were in the hospital after you are discharged, you can call the unit and asked to speak with the hospitalist on call if the hospitalist that took care of you is not available. Once you are discharged, your primary care physician will handle any further medical issues. Please note that NO REFILLS for any discharge medications will be authorized once you are discharged, as it is imperative that you return to your primary care physician (or establish a relationship with a primary care physician if you do not have one) for your aftercare needs so that they can reassess your need for medications and monitor your lab values.     Today   No acute events overnight.  Family at bedside.  No complaints. Very Hard of hearing.  D/c to SNF today.   VITAL SIGNS:  Blood pressure (!) 161/55, pulse 80, temperature 98.5 F (36.9 C), resp. rate 16, height  (1.676 m), weight 49.3 kg (108 lb 9.6 oz), SpO2 98 %.  I/O:   Intake/Output Summary (Last 24 hours) at 07/25/16 0946 Last data filed at 07/25/16 0326  Gross per 24 hour  Intake              480 ml  Output              650 ml  Net             -170 ml    PHYSICAL EXAMINATION:   GENERAL:  83 y.o.-year-old patient lying in bed in no acute distress.  EYES: Pupils equal, round, reactive to light and accommodation. No scleral icterus. Extraocular muscles intact.  HEENT: Head atraumatic, normocephalic. Oropharynx and nasopharynx clear. Very hard of hearing. NECK:  Supple, no jugular venous distention. No thyroid enlargement, no tenderness.  LUNGS: Normal breath sounds  bilaterally, no wheezing, rales, rhonchi. No use of accessory muscles of respiration.  CARDIOVASCULAR: S1, S2 normal. No murmurs, rubs, or gallops.  ABDOMEN: Soft, nontender, nondistended. Bowel sounds present. No organomegaly or mass.  EXTREMITIES: No cyanosis, clubbing or edema b/l.    NEUROLOGIC: Cranial nerves II through XII are intact. No focal Motor or sensory deficits b/l.   PSYCHIATRIC: The patient is alert and oriented x 3.  SKIN: No obvious rash, lesion, or ulcer.   DATA REVIEW:   CBC  Recent Labs Lab 07/24/16 0439  WBC 9.2  HGB 13.7  HCT 39.9  PLT 264    Chemistries  Recent Labs Lab 07/20/16 1218  07/23/16 1320 07/24/16 0439  NA 139  < > 136 136  K 3.8  < > 3.7 3.9  CL 107  < > 107 108  CO2 23  < > 22 22  GLUCOSE 136*  < > 163* 111*  BUN 23*  < > 24* 21*  CREATININE 1.14*  < > 0.81 0.79  CALCIUM 9.9  < > 9.7 9.3  MG  --   --  2.1  --   AST 17  --   --   --   ALT 10*  --   --   --   ALKPHOS 47  --   --   --   BILITOT 0.9  --   --   --   < > = values in this interval not displayed.  Cardiac Enzymes  Recent Labs Lab 07/21/16 0307  TROPONINI 0.03*    Microbiology Results  Results for orders placed or performed in visit on 01/17/13  Wound culture     Status: None   Collection Time: 01/17/13  2:57 PM  Result Value Ref Range Status   Gram Stain Rare  Final   Gram Stain WBC present-predominately PMN  Final   Gram Stain No Squamous Epithelial Cells Seen  Final   Gram Stain No Organisms Seen  Final   Organism ID, Bacteria Few STAPHYLOCOCCUS SPECIES (COAGULASE NEGATIVE)  Final    RADIOLOGY:  No results found.    Management plans discussed with the patient, family and they are in agreement.  CODE STATUS:     Code Status Orders        Start     Ordered   07/20/16 1503  Do not attempt resuscitation (DNR)  Continuous    Question Answer Comment  In the event of cardiac or respiratory ARREST Do not call a "code blue"   In the event of  cardiac or respiratory ARREST Do not perform Intubation, CPR, defibrillation or ACLS   In the event of cardiac or respiratory ARREST Use medication by any route, position, wound care, and other measures to relive pain and suffering. May use oxygen, suction and manual treatment of airway obstruction as needed for comfort.      07/20/16 1502    Code Status History    Date Active Date Inactive Code Status Order ID Comments User Context   This patient has a current code status but no historical code status.    Advance Directive Documentation     Most Recent Value  Type of Advance Directive  Healthcare Power of Attorney  Pre-existing out of facility DNR order (yellow form or pink MOST form)  -  "MOST" Form in Place?  -      TOTAL TIME TAKING CARE OF THIS PATIENT: 40 minutes.    Houston Siren M.D on 07/25/2016 at 9:46 AM  Between 7am to 6pm - Pager - [phone]  After 6pm go to [email or web address] - Scientist, research (life sciences) [facility]  Office  [phone]  CC: Primary care physician; Roxy Manns, MD

## 2016-07-25 NOTE — Progress Notes (Addendum)
Called report to SNF nurse at this time. All questions answered. Raynald Blend Called for non-emergent patient transport to SNF at this time. Jari Favre [facility]

## 2016-07-25 NOTE — Progress Notes (Signed)
Pt. Slept throughout the night getting up only to use BSC. Pt refused to be turn q2h. Pt. given bath, hair washed  and bed linen and gown changed. No signs or symptoms of SOB, pain or acute distress noted. Will continue to monitor pt.

## 2016-07-25 NOTE — Discharge Instructions (Signed)
Syncope °Syncope is when you temporarily lose consciousness. Syncope may also be called fainting or passing out. It is caused by a sudden decrease in blood flow to the brain. Even though most causes of syncope are not dangerous, syncope can be a sign of a serious medical problem. Signs that you may be about to faint include: °· Feeling dizzy or light-headed. °· Feeling nauseous. °· Seeing all white or all black in your field of vision. °· Having cold, clammy skin. °If you fainted, get medical help right away.Call your local emergency services (911 in the [place] Do not drive yourself to the hospital. °Follow these instructions at home: °Pay attention to any changes in your symptoms. Take these actions to help with your condition: °· Have someone stay with you until you feel stable. °· Do not drive, use machinery, or play sports until your health care provider says it is okay. °· Keep all follow-up visits as told by your health care provider. This is important. °· If you start to feel like you might faint, lie down right away and raise (elevate) your feet above the level of your heart. Breathe deeply and steadily. Wait until all of the symptoms have passed. °· Drink enough fluid to keep your urine clear or pale yellow. °· If you are taking blood pressure or heart medicine, get up slowly and take several minutes to sit and then stand. This can reduce dizziness. °· Take over-the-counter and prescription medicines only as told by your health care provider. °Get help right away if: °· You have a severe headache. °· You have unusual pain in your chest, abdomen, or back. °· You are bleeding from your mouth or rectum, or you have black or tarry stool. °· You have a very fast or irregular heartbeat (palpitations). °· You have pain with breathing. °· You faint once or repeatedly. °· You have a seizure. °· You are confused. °· You have trouble walking. °· You have severe weakness. °· You have vision problems. °These symptoms  may represent a serious problem that is an emergency. Do not wait to see if your symptoms will go away. Get medical help right away. Call your local emergency services (911 in the [place] Do not drive yourself to the hospital. °This information is not intended to replace advice given to you by your health care provider. Make sure you discuss any questions you have with your health care provider. °Document Released: 03/15/2005 Document Revised: 08/21/2015 Document Reviewed: 11/27/2014 °Elsevier Interactive Patient Education © 2017 [employer] ° °

## 2016-07-26 ENCOUNTER — Non-Acute Institutional Stay (SKILLED_NURSING_FACILITY): Payer: Medicare Other | Admitting: Internal Medicine

## 2016-07-26 DIAGNOSIS — R1310 Dysphagia, unspecified: Secondary | ICD-10-CM

## 2016-07-26 DIAGNOSIS — E785 Hyperlipidemia, unspecified: Secondary | ICD-10-CM

## 2016-07-26 DIAGNOSIS — N179 Acute kidney failure, unspecified: Secondary | ICD-10-CM

## 2016-07-26 DIAGNOSIS — M1990 Unspecified osteoarthritis, unspecified site: Secondary | ICD-10-CM | POA: Diagnosis not present

## 2016-07-26 DIAGNOSIS — K5900 Constipation, unspecified: Secondary | ICD-10-CM | POA: Diagnosis not present

## 2016-07-26 DIAGNOSIS — I10 Essential (primary) hypertension: Secondary | ICD-10-CM

## 2016-07-26 DIAGNOSIS — R531 Weakness: Secondary | ICD-10-CM | POA: Diagnosis not present

## 2016-07-26 DIAGNOSIS — R627 Adult failure to thrive: Secondary | ICD-10-CM | POA: Diagnosis not present

## 2016-07-26 DIAGNOSIS — E43 Unspecified severe protein-calorie malnutrition: Secondary | ICD-10-CM | POA: Diagnosis not present

## 2016-07-26 NOTE — Progress Notes (Signed)
LOCATION: [facility]    PCP: Roxy Manns, MD   Code Status: full code  Goals of care: Advanced Directive information Advanced Directives 07/20/2016  Does Patient Have a Medical Advance Directive? Yes  Type of Advance Directive Healthcare Power of Attorney  Does patient want to make changes to medical advance directive? No - Patient declined  Copy of Healthcare Power of Attorney in Chart? -      Extended Emergency Contact Information Primary Emergency Contact: Ok Edwards [place] Home Phone: [phone] Relation: Sister Secondary Emergency Contact: Fayne Mediate [place] Home Phone: [phone] Relation: Sister   Allergies  Allergen Reactions  . Alendronate Sodium     Leg tingling   . Hctz [Hydrochlorothiazide]     Dehydration/ syncope  . Hydrocodone     Syncope     Chief Complaint  Patient presents with  . New Admit To SNF    new admission     HPI:  Patient is a 83 y.o. female seen today for short term rehabilitation post hospital admission from 07/20/16-07/25/16 with syncope. Workup revealed orthostasis and AKI. She received iv fluids. She was on telemetry monitoring with no alarming findings. She has PMH of HTN, HLD, chronic constipation, OA among others. She is seen in her room today.   Review of Systems:  Constitutional: Negative for fever. Positive for generalized weakness.  HENT: Negative for headache, congestion, nasal discharge, difficulty swallowing. She is hard of hearing and has hearing aids.  Eyes: Negative for eye pain, double vision and discharge. she wears corrective glasses.  Respiratory: Negative for shortness of breath and wheezing.  Positive for occasional cough.  Cardiovascular: Negative for chest pain, palpitations.  Gastrointestinal: Negative for heartburn, nausea, vomiting, abdominal pain, loss of appetite. Does not remember her last bowel movement. Genitourinary: Negative  for dysuria.  Musculoskeletal: Negative for muscular pain, fall.  Skin: Negative for itching, rash.  Neurological: Negative for dizziness. Psychiatric/Behavioral: Negative for depression.   Past Medical History:  Diagnosis Date  . Carotid stenosis   . Chronic constipation   . HLD (hyperlipidemia) 2002  . HTN (hypertension)   . Osteoarthritis   . Osteopenia    with arm fracture   Past Surgical History:  Procedure Laterality Date  . Abd [place]  12/07   Negative AAA  . carotid doppler  11/07   50-79 % RICA  . carotid doppler     50-69 % RICA, lessa then 50% LICA-overall no change  . COLONOSCOPY  8/02   Diverticulosis  . DEXA  08/2002   Osteopenia  . DEXA  4/10   Osteopenia (worse at forearm T-1.98)-imp at LS -1.0  . TOTAL HIP ARTHROPLASTY  6/06  . TOTAL HIP ARTHROPLASTY  10/06   Social History:   reports that she has never smoked. She has never used smokeless tobacco. She reports that she does not drink alcohol or use drugs.  Family History  Problem Relation Age of Onset  . Heart attack Father   . Mitral valve prolapse Sister     Medications: Allergies as of 07/26/2016      Reactions   Alendronate Sodium    Leg tingling    Hctz [hydrochlorothiazide]    Dehydration/ syncope   Hydrocodone    Syncope      Medication List       Accurate as of 07/26/16 11:44 AM. Always use your most recent med list.  aspirin 81 MG tablet Take 81 mg by mouth daily.   CALCIUM+D3 600-800 MG-UNIT Tabs Generic drug:  Calcium Carb-Cholecalciferol Take 1 tablet by mouth daily.   docusate sodium 50 MG capsule Commonly known as:  COLACE Take 50 mg by mouth as needed.   FISH OIL PO Take 1 capsule by mouth 2 (two) times daily.   ibuprofen 200 MG tablet Commonly known as:  ADVIL,MOTRIN Take 200 mg by mouth every 6 (six) hours as needed.   lovastatin 20 MG tablet Commonly known as:  MEVACOR Take 1 tablet (20 mg total) by mouth daily.   olmesartan 40 MG tablet Commonly  known as:  BENICAR Take 1 tablet (40 mg total) by mouth daily.   PRESERVISION AREDS PO Take 1 tablet by mouth 2 (two) times daily.   vitamin B-12 1000 MCG tablet Commonly known as:  CYANOCOBALAMIN Take 1,000 mcg by mouth daily.   VITAMIN C PO Take 500 mg by mouth daily.   vitamin E 400 UNIT capsule Take 400 Units by mouth daily.       Immunizations: Immunization History  Administered Date(s) Administered  . Influenza Split 12/23/2010  . Influenza Whole 11/27/2005, 12/29/2006, 11/27/2008, 12/09/2009, 12/30/2011  . Influenza, High Dose Seasonal PF 12/12/2013, 12/17/2015  . Influenza-Unspecified 12/14/2012, 12/05/2014  . Pneumococcal Conjugate-13 01/21/2014  . Pneumococcal Polysaccharide-23 03/29/1994  . Td 02/28/1999, 07/09/2008     Physical Exam: Vitals:   07/26/16 1144  BP: 135/62  Pulse: 88  Resp: 18  Temp: 98.1 F (36.7 C)  SpO2: 96%   General- elderly female, frail and thin built, in no acute distress Head- normocephalic, atraumatic, has hearing aid Nose- no nasal discharge Throat- moist mucus membrane, normal oropharynx, missing teeth Eyes- PERRLA, EOMI, no pallor, no icterus, no discharge, normal conjunctiva, normal sclera Neck- no cervical lymphadenopathy Cardiovascular- normal s1,s2, no murmur Respiratory- bilateral clear to auscultation, no wheeze, no rhonchi, no crackles, no use of accessory muscles Abdomen- bowel sounds present, soft, non tender, no guarding or rigidity Musculoskeletal- able to move all 4 extremities, generalized weakness, arthritis changes to her fingers Neurological- alert and oriented to person, place and time Skin- warm and dry, chronic skin changes to her legs Psychiatry- normal mood and affect    Labs reviewed: Basic Metabolic Panel:  Recent Labs  40/98/11 0307 07/23/16 1320 07/24/16 0439  NA 138 136 136  K 3.7 3.7 3.9  CL 107 107 108  CO2 GLUCOSE 112* 163* 111*  BUN 18 24* 21*  CREATININE 0.85 0.81  0.79  CALCIUM 9.8 9.7 9.3  MG  --  2.1  --    Liver Function Tests:  Recent Labs  02/16/16 1523 07/20/16 1218  AST 15 17  ALT 9 10*  ALKPHOS 56 47  BILITOT 0.4 0.9  PROT 6.8 7.3  ALBUMIN 4.2 4.0   No results for input(s): LIPASE, AMYLASE in the last 8760 hours. No results for input(s): AMMONIA in the last 8760 hours. CBC:  Recent Labs  02/16/16 1523 07/20/16 1218 07/21/16 0307 07/24/16 0439  WBC 8.4 9.7 9.1 9.2  NEUTROABS 5.7 7.9*  --   --   HGB 12.9 13.5 12.7 13.7  HCT 39.2 40.1 36.8 39.9  MCV 95.5 94.0 93.0 95.1  PLT 231.0 270 252 264   Cardiac Enzymes:  Recent Labs  07/20/16 1521 07/20/16 2053 07/21/16 0307  TROPONINI <0.03 <0.03 0.03*   BNP: Invalid input(s): POCBNP CBG: No results for input(s): GLUCAP in the last 8760 hours.  Radiological Exams: Ct Head Wo Contrast  Result Date: 07/20/2016 CLINICAL DATA:  Syncope today. EXAM: CT HEAD WITHOUT CONTRAST TECHNIQUE: Contiguous axial images were obtained from the base of the skull through the vertex without intravenous contrast. COMPARISON:  Head CT scan 11/07/2000. FINDINGS: Brain: No acute abnormality including hemorrhage, infarct, mass lesion, mass effect, midline shift or abnormal extra-axial fluid collection. No hydrocephalus or pneumocephalus. Vascular: Atherosclerosis noted. Skull: Intact. Sinuses/Orbits: No acute abnormality. Status post bilateral lens extraction. Other: None. IMPRESSION: No acute abnormality. Atherosclerosis. Electronically Signed   By: Drusilla Kanner M.D.   On: 07/20/2016 14:40   Ct Abdomen Pelvis W Contrast  Result Date: 07/20/2016 CLINICAL DATA:  Syncope at doctor's office. History of chronic constipation, hypertension. EXAM: CT ABDOMEN AND PELVIS WITH CONTRAST TECHNIQUE: Multidetector CT imaging of the abdomen and pelvis was performed using the standard protocol following bolus administration of intravenous contrast. CONTRAST:  75mL ISOVUE-300 IOPAMIDOL (ISOVUE-300) INJECTION 61%  COMPARISON:  None. FINDINGS: Mild respiratory motion degraded examination. LOWER CHEST: Lung bases are clear. Included heart size is normal. No pericardial effusion. HEPATOBILIARY: Liver and gallbladder are normal. PANCREAS: Normal. SPLEEN: Normal. ADRENALS/URINARY TRACT: Kidneys are orthotopic, demonstrating symmetric enhancement. No nephrolithiasis, hydronephrosis or solid renal masses. Pelviectasis. Too small to characterize hypodensities in the kidneys bilaterally. RIGHT upper pole renal scarring. The unopacified ureters are normal in course and caliber. Delayed imaging through the kidneys demonstrates symmetric prompt contrast excretion within the proximal urinary collecting system. Urinary bladder is obscured by streak artifact. Normal adrenal glands. STOMACH/BOWEL: The stomach, small and large bowel are normal in course and caliber without inflammatory changes. Mild amount of retained large bowel stool. Small duodenum diverticulum. Normal appendix. VASCULAR/LYMPHATIC: Aortoiliac vessels are normal in course and caliber, severe calcific atherosclerosis. No lymphadenopathy by CT size criteria. REPRODUCTIVE: Obscured by streak artifact from hip arthroplasties. OTHER: No intraperitoneal free fluid or free air. MUSCULOSKELETAL: Nonacute. Osteopenia. Multiple Tarlov cysts. Bilateral total hip arthroplasties result in streak artifact. Mid lumbar dextroscoliosis. Severe L2-3 degenerative disc. Grade 1 L2-3 retrolisthesis. Grade 1 L5-S1 anterolisthesis, no spondylolysis. Moderate to severe L5-S1 neural foraminal narrowing. Severe LEFT L2-3 neural foraminal narrowing. Moderate canal stenosis L4-5 and L5-S1. IMPRESSION: No acute intra-abdominal or pelvic process on this respiratory motion degraded examination. Severe atherosclerosis. Osteopenia. Electronically Signed   By: Awilda Metro M.D.   On: 07/20/2016 14:49   US Carotid Bilateral  Result Date: 07/21/2016 CLINICAL DATA:  CVA. EXAM: BILATERAL CAROTID DUPLEX  ULTRASOUND TECHNIQUE: Wallace Cullens scale imaging, color Doppler and duplex ultrasound were performed of bilateral carotid and vertebral arteries in the neck. COMPARISON:  CT 07/20/2016.  CTA 03/11/2006 . FINDINGS: Criteria: Quantification of carotid stenosis is based on velocity parameters that correlate the residual internal carotid diameter with NASCET-based stenosis levels, using the diameter of the distal internal carotid lumen as the denominator for stenosis measurement. The following velocity measurements were obtained: RIGHT ICA:  246/43 cm/sec CCA:  90/6 cm/sec SYSTOLIC ICA/CCA RATIO:  2.5 DIASTOLIC ICA/CCA RATIO:  6.8 ECA:  192 cm/sec LEFT ICA:  123/19 cm/sec CCA:   101/10 cm/sec SYSTOLIC ICA/CCA RATIO:  1.2 DIASTOLIC ICA/CCA RATIO:  1.9 ECA:  125 cm/sec RIGHT CAROTID ARTERY: Moderate right carotid bifurcation and proximal ICA atherosclerotic vascular plaque with degree of stenosis 50-69% on today's exam . RIGHT VERTEBRAL ARTERY:  Patent with antegrade flow. LEFT CAROTID ARTERY: Mild to moderate moderate left common carotid, carotid bifurcation, proximal ICA atherosclerotic vascular plaque. Degree of stenosis less than 50%. LEFT VERTEBRAL ARTERY:  Patent with antegrade flow.  IMPRESSION: 1. Moderate right carotid bifurcation and proximal ICA atherosclerotic vascular plaque with degree of stenosis 50-69%. 2. Mild moderate left common carotid, carotid bifurcation, proximal ICA atherosclerotic vascular plaque. Degree of stenosis less 50%. 3. Vertebrals are patent antegrade flow. Electronically Signed   By: Maisie Fus  Register   On: 07/21/2016 06:25   Dg Chest Portable 1 View  Result Date: 07/20/2016 CLINICAL DATA:  83 y/o  F; syncopal episode. EXAM: PORTABLE CHEST 1 VIEW COMPARISON:  11/09/2010 chest radiograph. FINDINGS: Stable cardiac silhouette. Aortic atherosclerosis with calcification. Stable biapical pleuroparenchymal scarring. Stable nodularity of the right suprahilar region. No consolidation, effusion, or  pneumothorax. Mild levocurvature of the lower thoracic spine. IMPRESSION: No acute pulmonary process identified.  Aortic atherosclerosis. Electronically Signed   By: Mitzi Hansen M.D.   On: 07/20/2016 13:36    Assessment/Plan  Generalized weakness From deconditioning. Will have her work with physical therapy and occupational therapy team to help with gait training and muscle strengthening exercises.fall precautions. Skin care. Encourage to be out of bed.   Failure to thrive With severe protein calorie malnutrition. Monitor po intake and check weekly weight. Assist with feeding. SLP consult with her dysphagia  Dysphagia Get SLP to evaluate, aspiration precautions and assist with feeding for now. c/w puree diet and thin liquids.   Protein calorie malnutrition Severe, monitor po intake. RD consult. Pressure ulcer prophylaxis. Continue vitamin supplements home regimen  HTN Monitor BP, continue benicar 40 mg daily and aspirin, monitor BP bid x 2 weeks.   AKI s/p iv fluids, denies urinary complaints. Check bmp  Constipation On colace 50 mg qhs prn, change this to 100 mg daily for now x 3 days, then every 2 days if no bowel movement and monitor. Hydration to be maintained.   OA On ibuprofen 200 mg q6h prn, given her age and being on aspirin and recent AKI, d/c ibuprofen. Start tylenol 325 mg 2 tab q8h prn pain. Continue calcium and vitamin D supplement.  HLD Continue lovastatin 20 mg daily and fish oil.      Goals of care: short term rehabilitation   Labs/tests ordered: cbc, bmp 07/29/16  Family/ staff Communication: reviewed care plan with patient and nursing supervisor  I have spent greater than 50 minutes for this encounter which includes reviewing hospital records, addressing above mentioned concerns, reviewing care plan with patient, answering patient's concerns and counseling her.     Oneal Grout, MD Internal Medicine [facility] [address] [place], [place] [address] Cell Phone (Monday-Friday 8 am - 5 pm): [phone] On Call: [phone] and follow prompts after 5 pm and on weekends Office Phone: [phone] Office Fax: [phone]

## 2016-08-31 ENCOUNTER — Telehealth: Payer: Self-pay | Admitting: *Deleted

## 2016-08-31 DIAGNOSIS — R55 Syncope and collapse: Secondary | ICD-10-CM | POA: Diagnosis not present

## 2016-08-31 DIAGNOSIS — M6281 Muscle weakness (generalized): Secondary | ICD-10-CM | POA: Diagnosis not present

## 2016-08-31 DIAGNOSIS — I639 Cerebral infarction, unspecified: Secondary | ICD-10-CM | POA: Diagnosis not present

## 2016-08-31 DIAGNOSIS — F0390 Unspecified dementia without behavioral disturbance: Secondary | ICD-10-CM | POA: Diagnosis not present

## 2016-08-31 DIAGNOSIS — E785 Hyperlipidemia, unspecified: Secondary | ICD-10-CM | POA: Diagnosis not present

## 2016-08-31 DIAGNOSIS — I10 Essential (primary) hypertension: Secondary | ICD-10-CM | POA: Diagnosis not present

## 2016-08-31 NOTE — Telephone Encounter (Signed)
No vitamins have been proven to help memory/slow memory loss but the best data lie with B12 and fish oil (omega 3) and vit E I am not aware of any combo of these 3  If she could tell me the name of what she was on-it would be helpful   Otherwise I recommend getting the 3 above separately (and in fact they are on her med list- but I am not sure they are giving them   Please confirm either with her residence or with the caller that she is or is not getting the B12, omega 3 , and vit E and let me know   Thanks

## 2016-08-31 NOTE — Telephone Encounter (Signed)
Attempted to contact Betty; line d/c

## 2016-08-31 NOTE — Telephone Encounter (Signed)
Patient's sister [place] ([place] left a voicemail stating that when patient was in last year she was advised to stop taking the vitamin to help with her memory. Kathie RhodesBetty stated that she does not remember the name of it, but feels that patient should start it back. Kathie RhodesBetty stated that patient's memory is getting worse and she feels that she needs it. Kathie RhodesBetty requested that an order for the memory vitamin be faxed to [facility].

## 2016-09-01 NOTE — Telephone Encounter (Signed)
Spoke to [place] and advised per Dr Milinda Antisower. She states that she will contact [facility] and confirm pt was receiving each supplement on a daily basis

## 2016-09-13 DIAGNOSIS — F0390 Unspecified dementia without behavioral disturbance: Secondary | ICD-10-CM | POA: Diagnosis not present

## 2016-09-13 DIAGNOSIS — M6281 Muscle weakness (generalized): Secondary | ICD-10-CM | POA: Diagnosis not present

## 2016-09-21 DIAGNOSIS — I69320 Aphasia following cerebral infarction: Secondary | ICD-10-CM | POA: Diagnosis not present

## 2016-09-21 DIAGNOSIS — R1312 Dysphagia, oropharyngeal phase: Secondary | ICD-10-CM | POA: Diagnosis not present

## 2016-09-21 DIAGNOSIS — R2689 Other abnormalities of gait and mobility: Secondary | ICD-10-CM | POA: Diagnosis not present

## 2016-09-21 DIAGNOSIS — I69391 Dysphagia following cerebral infarction: Secondary | ICD-10-CM | POA: Diagnosis not present

## 2016-09-21 DIAGNOSIS — M6281 Muscle weakness (generalized): Secondary | ICD-10-CM | POA: Diagnosis not present

## 2016-09-21 DIAGNOSIS — I69398 Other sequelae of cerebral infarction: Secondary | ICD-10-CM | POA: Diagnosis not present

## 2016-09-21 DIAGNOSIS — R55 Syncope and collapse: Secondary | ICD-10-CM | POA: Diagnosis not present

## 2016-09-21 DIAGNOSIS — Z7982 Long term (current) use of aspirin: Secondary | ICD-10-CM | POA: Diagnosis not present

## 2016-09-21 DIAGNOSIS — M1990 Unspecified osteoarthritis, unspecified site: Secondary | ICD-10-CM | POA: Diagnosis not present

## 2016-09-21 DIAGNOSIS — I10 Essential (primary) hypertension: Secondary | ICD-10-CM | POA: Diagnosis not present

## 2016-09-21 DIAGNOSIS — E43 Unspecified severe protein-calorie malnutrition: Secondary | ICD-10-CM | POA: Diagnosis not present

## 2016-09-22 ENCOUNTER — Telehealth: Payer: Self-pay

## 2016-09-22 DIAGNOSIS — I69398 Other sequelae of cerebral infarction: Secondary | ICD-10-CM | POA: Diagnosis not present

## 2016-09-22 DIAGNOSIS — R55 Syncope and collapse: Secondary | ICD-10-CM | POA: Diagnosis not present

## 2016-09-22 DIAGNOSIS — I69391 Dysphagia following cerebral infarction: Secondary | ICD-10-CM | POA: Diagnosis not present

## 2016-09-22 DIAGNOSIS — R1312 Dysphagia, oropharyngeal phase: Secondary | ICD-10-CM | POA: Diagnosis not present

## 2016-09-22 DIAGNOSIS — M6281 Muscle weakness (generalized): Secondary | ICD-10-CM | POA: Diagnosis not present

## 2016-09-22 DIAGNOSIS — R2689 Other abnormalities of gait and mobility: Secondary | ICD-10-CM | POA: Diagnosis not present

## 2016-09-22 NOTE — Telephone Encounter (Signed)
Please verbally ok those orders  thanks

## 2016-09-22 NOTE — Telephone Encounter (Signed)
[facility] at [facility] left vm requesting verbal orders for more visits for speech therapy for Dysphagia to see if pt is "appropiate for a diet upgrade from texture modified to regular consistency.

## 2016-09-22 NOTE — Telephone Encounter (Signed)
Gaye AlkenAmy Moon, PT with [facility] also called for verbal PT orders 3x week for 1 weeks, 2x week for 3 weeks, 1x week for 1 week for strengthening, balance and safety. Call [phone] with order

## 2016-09-23 DIAGNOSIS — M6281 Muscle weakness (generalized): Secondary | ICD-10-CM | POA: Diagnosis not present

## 2016-09-23 DIAGNOSIS — I69398 Other sequelae of cerebral infarction: Secondary | ICD-10-CM | POA: Diagnosis not present

## 2016-09-23 DIAGNOSIS — R55 Syncope and collapse: Secondary | ICD-10-CM | POA: Diagnosis not present

## 2016-09-23 DIAGNOSIS — I69391 Dysphagia following cerebral infarction: Secondary | ICD-10-CM | POA: Diagnosis not present

## 2016-09-23 DIAGNOSIS — R1312 Dysphagia, oropharyngeal phase: Secondary | ICD-10-CM | POA: Diagnosis not present

## 2016-09-23 DIAGNOSIS — R2689 Other abnormalities of gait and mobility: Secondary | ICD-10-CM | POA: Diagnosis not present

## 2016-09-23 NOTE — Telephone Encounter (Signed)
Verbal orders given to Amy 

## 2016-09-24 ENCOUNTER — Telehealth: Payer: Self-pay

## 2016-09-24 DIAGNOSIS — R2689 Other abnormalities of gait and mobility: Secondary | ICD-10-CM | POA: Diagnosis not present

## 2016-09-24 DIAGNOSIS — I69398 Other sequelae of cerebral infarction: Secondary | ICD-10-CM | POA: Diagnosis not present

## 2016-09-24 DIAGNOSIS — I69391 Dysphagia following cerebral infarction: Secondary | ICD-10-CM | POA: Diagnosis not present

## 2016-09-24 DIAGNOSIS — R1312 Dysphagia, oropharyngeal phase: Secondary | ICD-10-CM | POA: Diagnosis not present

## 2016-09-24 DIAGNOSIS — R55 Syncope and collapse: Secondary | ICD-10-CM | POA: Diagnosis not present

## 2016-09-24 DIAGNOSIS — M6281 Muscle weakness (generalized): Secondary | ICD-10-CM | POA: Diagnosis not present

## 2016-09-24 NOTE — Telephone Encounter (Signed)
Physical therapist from [facility] dleft VM to let you know that pt's BP was elevated today 174/74 while laying down and the rest of her vital signs were within normal range. She said her BP has been running "a bit high". Please advise.

## 2016-09-24 NOTE — Telephone Encounter (Signed)
In the absence of symptoms, I would get a few more readings and then have them update us/PCP to see if she needs a change in meds/orders.   Thanks.  Routed to PCP as FYI.

## 2016-09-24 NOTE — Telephone Encounter (Signed)
Occupational therapist from [facility] left vm requesting verbal orders for safety awareness she said patient has had multiple falls and also to increase indepence with ADL.

## 2016-09-26 NOTE — Telephone Encounter (Signed)
Please OK those verbal orders  

## 2016-09-26 NOTE — Telephone Encounter (Signed)
Please check in with them Monday and see how she is doing, thanks

## 2016-09-27 DIAGNOSIS — I69398 Other sequelae of cerebral infarction: Secondary | ICD-10-CM | POA: Diagnosis not present

## 2016-09-27 DIAGNOSIS — I69391 Dysphagia following cerebral infarction: Secondary | ICD-10-CM | POA: Diagnosis not present

## 2016-09-27 DIAGNOSIS — M6281 Muscle weakness (generalized): Secondary | ICD-10-CM | POA: Diagnosis not present

## 2016-09-27 DIAGNOSIS — R2689 Other abnormalities of gait and mobility: Secondary | ICD-10-CM | POA: Diagnosis not present

## 2016-09-27 DIAGNOSIS — R1312 Dysphagia, oropharyngeal phase: Secondary | ICD-10-CM | POA: Diagnosis not present

## 2016-09-27 DIAGNOSIS — R55 Syncope and collapse: Secondary | ICD-10-CM | POA: Diagnosis not present

## 2016-09-27 NOTE — Telephone Encounter (Signed)
Called Amy, PT and she said her BP has been more elevated lately but she is just checking it before physical therapy and doesn't have any readings to give me but she did say that every time she has checked it it has been elevated. Amy said if Dr. Milinda Antisower wants to send them an order the nurse or med tech will check it daily going forward but they have to have an order for that. Amy did say that pt's BP of 174/47 was the highest it's been, it has been elevated since then but not that high

## 2016-09-27 NOTE — Telephone Encounter (Signed)
Per Amy pt needed a face to face appt and pt's sister scheduled it

## 2016-09-27 NOTE — Telephone Encounter (Signed)
Verbal orders given to Lisa. 

## 2016-09-27 NOTE — Telephone Encounter (Signed)
Please have her f/u for an appt for BP Thanks

## 2016-09-28 ENCOUNTER — Telehealth: Payer: Self-pay

## 2016-09-28 NOTE — Telephone Encounter (Signed)
[name] speech therapist with [facility] left v/m [facility] for 2 weeks for determination most safe and appropriate diet.

## 2016-09-28 NOTE — Telephone Encounter (Signed)
Please ok those verbal orders  

## 2016-09-30 DIAGNOSIS — M6281 Muscle weakness (generalized): Secondary | ICD-10-CM | POA: Diagnosis not present

## 2016-09-30 DIAGNOSIS — I69398 Other sequelae of cerebral infarction: Secondary | ICD-10-CM | POA: Diagnosis not present

## 2016-09-30 DIAGNOSIS — R1312 Dysphagia, oropharyngeal phase: Secondary | ICD-10-CM | POA: Diagnosis not present

## 2016-09-30 DIAGNOSIS — R55 Syncope and collapse: Secondary | ICD-10-CM | POA: Diagnosis not present

## 2016-09-30 DIAGNOSIS — R2689 Other abnormalities of gait and mobility: Secondary | ICD-10-CM | POA: Diagnosis not present

## 2016-09-30 DIAGNOSIS — I69391 Dysphagia following cerebral infarction: Secondary | ICD-10-CM | POA: Diagnosis not present

## 2016-09-30 NOTE — Telephone Encounter (Signed)
Left voicemail giving Lynden AngCathy the Verbal order

## 2016-10-01 DIAGNOSIS — I69398 Other sequelae of cerebral infarction: Secondary | ICD-10-CM | POA: Diagnosis not present

## 2016-10-01 DIAGNOSIS — R1312 Dysphagia, oropharyngeal phase: Secondary | ICD-10-CM | POA: Diagnosis not present

## 2016-10-01 DIAGNOSIS — M6281 Muscle weakness (generalized): Secondary | ICD-10-CM | POA: Diagnosis not present

## 2016-10-01 DIAGNOSIS — R2689 Other abnormalities of gait and mobility: Secondary | ICD-10-CM | POA: Diagnosis not present

## 2016-10-01 DIAGNOSIS — R55 Syncope and collapse: Secondary | ICD-10-CM | POA: Diagnosis not present

## 2016-10-01 DIAGNOSIS — I69391 Dysphagia following cerebral infarction: Secondary | ICD-10-CM | POA: Diagnosis not present

## 2016-10-02 DIAGNOSIS — M6281 Muscle weakness (generalized): Secondary | ICD-10-CM | POA: Diagnosis not present

## 2016-10-02 DIAGNOSIS — R2689 Other abnormalities of gait and mobility: Secondary | ICD-10-CM | POA: Diagnosis not present

## 2016-10-02 DIAGNOSIS — R55 Syncope and collapse: Secondary | ICD-10-CM | POA: Diagnosis not present

## 2016-10-02 DIAGNOSIS — R1312 Dysphagia, oropharyngeal phase: Secondary | ICD-10-CM | POA: Diagnosis not present

## 2016-10-02 DIAGNOSIS — I69398 Other sequelae of cerebral infarction: Secondary | ICD-10-CM | POA: Diagnosis not present

## 2016-10-02 DIAGNOSIS — I69391 Dysphagia following cerebral infarction: Secondary | ICD-10-CM | POA: Diagnosis not present

## 2016-10-03 ENCOUNTER — Emergency Department: Payer: Medicare Other

## 2016-10-03 ENCOUNTER — Emergency Department
Admission: EM | Admit: 2016-10-03 | Discharge: 2016-10-03 | Disposition: A | Discharge status: Home / Self Care | Payer: Medicare Other | Attending: Emergency Medicine | Admitting: Emergency Medicine

## 2016-10-03 ENCOUNTER — Encounter: Payer: Self-pay | Admitting: Emergency Medicine

## 2016-10-03 DIAGNOSIS — I10 Essential (primary) hypertension: Secondary | ICD-10-CM | POA: Insufficient documentation

## 2016-10-03 DIAGNOSIS — Y92018 Other place in single-family (private) house as the place of occurrence of the external cause: Secondary | ICD-10-CM | POA: Insufficient documentation

## 2016-10-03 DIAGNOSIS — Z79899 Other long term (current) drug therapy: Secondary | ICD-10-CM | POA: Insufficient documentation

## 2016-10-03 DIAGNOSIS — Z7982 Long term (current) use of aspirin: Secondary | ICD-10-CM | POA: Insufficient documentation

## 2016-10-03 DIAGNOSIS — W010XXA Fall on same level from slipping, tripping and stumbling without subsequent striking against object, initial encounter: Secondary | ICD-10-CM | POA: Diagnosis not present

## 2016-10-03 DIAGNOSIS — W19XXXA Unspecified fall, initial encounter: Secondary | ICD-10-CM

## 2016-10-03 DIAGNOSIS — S79911A Unspecified injury of right hip, initial encounter: Secondary | ICD-10-CM | POA: Diagnosis not present

## 2016-10-03 DIAGNOSIS — S32591A Other specified fracture of right pubis, initial encounter for closed fracture: Secondary | ICD-10-CM | POA: Insufficient documentation

## 2016-10-03 DIAGNOSIS — Y939 Activity, unspecified: Secondary | ICD-10-CM | POA: Diagnosis not present

## 2016-10-03 DIAGNOSIS — Y999 Unspecified external cause status: Secondary | ICD-10-CM | POA: Insufficient documentation

## 2016-10-03 DIAGNOSIS — Y92009 Unspecified place in unspecified non-institutional (private) residence as the place of occurrence of the external cause: Secondary | ICD-10-CM

## 2016-10-03 DIAGNOSIS — M25551 Pain in right hip: Secondary | ICD-10-CM | POA: Diagnosis not present

## 2016-10-03 MED ORDER — TRAMADOL HCL 50 MG PO TABS
50.0000 mg | ORAL_TABLET | Freq: Once | ORAL | Status: AC
  Administered 2016-10-03: 50 mg via ORAL
  Filled 2016-10-03: qty 1

## 2016-10-03 MED ORDER — TRAMADOL HCL 50 MG PO TABS: 50.0000 mg | ORAL_TABLET | Freq: Two times a day (BID) | ORAL | 0 refills | Status: DC

## 2016-10-03 MED ORDER — ACETAMINOPHEN 325 MG PO TABS
650.0000 mg | ORAL_TABLET | Freq: Once | ORAL | Status: AC
  Administered 2016-10-03: 650 mg via ORAL
  Filled 2016-10-03: qty 2

## 2016-10-03 NOTE — Discharge Instructions (Signed)
You have sustained a fracture to the ramus of the pelvis. This is the portion of the pelvic you sit on when in a seated position. Take the pain medicine twice daily as prescribed. You should also walk and change position regularly, to prevent deterioration of this injury. I would suggest the use of a seat cushion as well. Follow-up with your provider or Dr. Deeann SaintHoward Miller for ongoing symptoms and further evaluation.

## 2016-10-03 NOTE — ED Notes (Signed)
Report to [name] [name] at [facility]. Verbalizes understanding of d/c instructions, medication prescription and administration, continuity of care, follow-up and pt family here to provide transport back to facility from ER.    Above also reviewed with family and pt in room. Understanding verbalized by pt and 3 sisters at bedside that will be transporting pt back to [facility].

## 2016-10-03 NOTE — ED Provider Notes (Signed)
[facility] Emergency Department Provider Note ____________________________________________  Time seen: 1730  I have reviewed the triage vital signs and the nursing notes.  HISTORY  Chief Complaint  Fall  HPI [name] is a 83 y.o. female presents to the ED from [facility] facility,accompanied by her 3 other sisters. She presents to the ED for evaluation of right buttocks and hip pain. The patient reports that she was standing in her room, when she turned and fell to the ground. She describes landing on her buttocks. According to report from the facility, she was unable to reach her call bell, and he crawled to the door and called out for help. Since that time the patient has complained of pain to the right buttocks and hip that is aggravated by prolonged sitting and attempts to transition from sit to stand. She normally and plays with a walker. He, laceration, or other injury. She does note a small superficial area of ecchymosis to the lateral right elbow without skin tear. Patient is status post bilateral total hip arthroplasties.  Past Medical History:  Diagnosis Date  . Carotid stenosis   . Chronic constipation   . HLD (hyperlipidemia) 2002  . HTN (hypertension)   . Osteoarthritis   . Osteopenia    with arm fracture    Patient Active Problem List   Diagnosis Date Noted  . Orthostatic hypotension 07/22/2016  . Fatigue 07/20/2016  . Mental status change 07/20/2016  . Syncope 07/20/2016  . Elevated TSH 02/11/2015  . Mobility impaired 06/18/2014  . Dystrophic nail 07/11/2013  . Seborrheic keratoses 07/11/2013  . Callus of foot 01/17/2013  . Encounter for Medicare annual wellness exam 07/10/2012  . Hyperglycemia 01/21/2012  . Leg pain, bilateral 10/15/2011  . Ankle edema 10/15/2011  . B12 deficiency 08/22/2008  . Osteopenia 07/09/2008  . Carotid stenosis 11/14/2006  . Hyperlipidemia LDL goal <130 06/09/2006  . Essential  hypertension 06/09/2006  . CONSTIPATION, CHRONIC 06/09/2006  . Osteoarthritis 06/09/2006    Past Surgical History:  Procedure Laterality Date  . Abd [place]  12/07   Negative AAA  . carotid doppler  11/07   50-79 % RICA  . carotid doppler     50-69 % RICA, lessa then 50% LICA-overall no change  . COLONOSCOPY  8/02   Diverticulosis  . DEXA  08/2002   Osteopenia  . DEXA  4/10   Osteopenia (worse at forearm T-1.98)-imp at LS -1.0  . TOTAL HIP ARTHROPLASTY  6/06  . TOTAL HIP ARTHROPLASTY  10/06    Prior to Admission medications   Medication Sig Start Date End Date Taking? Authorizing Provider  Ascorbic Acid (VITAMIN C PO) Take 500 mg by mouth daily.     [provider]  aspirin 81 MG tablet Take 81 mg by mouth daily.      [provider]  Calcium Carb-Cholecalciferol (CALCIUM+D3) 600-800 MG-UNIT TABS Take 1 tablet by mouth daily.    [provider]  docusate sodium (COLACE) 50 MG capsule Take 50 mg by mouth as needed.     [provider]  ibuprofen (ADVIL,MOTRIN) 200 MG tablet Take 200 mg by mouth every 6 (six) hours as needed.     [provider]  lovastatin (MEVACOR) 20 MG tablet Take 1 tablet (20 mg total) by mouth daily. 03/01/16   Tower, Audrie Gallus, MD  Multiple Vitamins-Minerals (PRESERVISION AREDS PO) Take 1 tablet by mouth 2 (two) times daily.     [provider]  olmesartan (  BENICAR) 40 MG tablet Take 1 tablet (40 mg total) by mouth daily. 03/01/16   Tower, Audrie GallusMarne A, MD  Omega-3 Fatty Acids (FISH OIL PO) Take 1 capsule by mouth 2 (two) times daily.      [provider]  vitamin B-12 (CYANOCOBALAMIN) 1000 MCG tablet Take 1,000 mcg by mouth daily.    [provider]  vitamin E 400 UNIT capsule Take 400 Units by mouth daily.      [provider]    Allergies Alendronate sodium; Hctz [hydrochlorothiazide]; and Hydrocodone  Family History  Problem Relation Age of Onset  . Heart attack Father   . Mitral  valve prolapse Sister     Social History Social History  Substance Use Topics  . Smoking status: Never Smoker  . Smokeless tobacco: Never Used  . Alcohol use No    Review of Systems  Constitutional: Negative for fever. Cardiovascular: Negative for chest pain. Respiratory: Negative for shortness of breath. Gastrointestinal: Negative for abdominal pain, vomiting and diarrhea. Genitourinary: Negative for dysuria. Musculoskeletal: Negative for back pain. Right hip and buttocks pain as above. Skin: Negative for rash. Neurological: Negative for headaches, focal weakness or numbness. ____________________________________________  PHYSICAL EXAM:  VITAL SIGNS: ED Triage Vitals  Enc Vitals Group     BP 10/03/16 1646 (!) 187/97     Pulse Rate 10/03/16 1646 93     Resp 10/03/16 1646 18     Temp 10/03/16 1646 98.1 F (36.7 C)     Temp Source 10/03/16 1646 Oral     SpO2 10/03/16 1646 100 %     Weight 10/03/16 1647 106 lb (48.1 kg)     Height 10/03/16 1647 5\' 6"  (1.676 m)     Head Circumference --      Peak Flow --      Pain Score 10/03/16 1646 2     Pain Loc --      Pain Edu? --      Excl. in GC? --     Constitutional: Alert and oriented. Well appearing and in no distress. Head: Normocephalic and atraumatic. Cardiovascular: Normal rate, regular rhythm. Normal distal pulses. Respiratory: Normal respiratory effort. No wheezes/rales/rhonchi. Gastrointestinal: Soft and nontender. No distention. Musculoskeletal: Right elbow without any deformity, dislocation, or effusion. Superficial ecchymosis noted. Patient without midline tenderness, spasm, deformity, or step-off. She is tender to palpation to the right issue ramus on exam. Normal range of motion of the hips bilaterally. No lower extremities deformity or leg length discrepancy is present. Nontender with normal range of motion in all extremities.  Neurologic:  Normal gait without ataxia. Normal speech and language. No gross focal  neurologic deficits are appreciated. Skin:  Skin is warm, dry and intact. No rash noted. ____________________________________________   RADIOLOGY  Right Hip/Pelvis  IMPRESSION: Cortical irregularity of right inferior pubic ramus may represent a nondisplaced fracture. No other fracture or dislocation identified.  I, Aleric Froelich, Charlesetta IvoryJenise V Bacon, personally viewed and evaluated these images (plain radiographs) as part of my medical decision making, as well as reviewing the written report by the radiologist. ____________________________________________  PROCEDURES  Tylenol 650 mg PO Ultram 50 mg PO ____________________________________________  INITIAL IMPRESSION / ASSESSMENT AND PLAN / ED COURSE  Patient with a stable, nondisplaced, right pubic ramus fracture. She is discharged with prescription for Ultram to dose as directed twice daily. She will follow up with her ortho,  primary care provider or the [facility]. ____________________________________________  FINAL CLINICAL IMPRESSION(S) / ED DIAGNOSES  Final  diagnoses:  Fall in home, initial encounter  Inferior pubic ramus fracture, right, closed, initial encounter [facility])      Karmen Stabs, Charlesetta Ivory, PA-C 10/03/16 1929    Sharman Cheek, MD 10/04/16 1558

## 2016-10-03 NOTE — ED Notes (Signed)
Pt brought to room via wheelchair. [facility] [place] [facility] [place], denies blood thinner use, denies hitting head. Has bruise to L elbow. States R buttock hurts. Denies hip pain, arm pain, leg pain. Here with family members.

## 2016-10-03 NOTE — ED Triage Notes (Signed)
Patient presents to the ED from [facility] assisted living with her sisters.  Patient states this morning she was standing at the foot of her bed and she turned around and then fell.  Patient denies weakness, dizziness, or loss of consciousness.  Patient denies hitting her head.  Patient is complaining of right buttock/hip pain.  Patient is hard of hearing.  Patient states pain is minimal when sitting but worse with movement.

## 2016-10-04 DIAGNOSIS — I69391 Dysphagia following cerebral infarction: Secondary | ICD-10-CM | POA: Diagnosis not present

## 2016-10-04 DIAGNOSIS — R2689 Other abnormalities of gait and mobility: Secondary | ICD-10-CM | POA: Diagnosis not present

## 2016-10-04 DIAGNOSIS — R1312 Dysphagia, oropharyngeal phase: Secondary | ICD-10-CM | POA: Diagnosis not present

## 2016-10-04 DIAGNOSIS — I69398 Other sequelae of cerebral infarction: Secondary | ICD-10-CM | POA: Diagnosis not present

## 2016-10-04 DIAGNOSIS — M6281 Muscle weakness (generalized): Secondary | ICD-10-CM | POA: Diagnosis not present

## 2016-10-04 DIAGNOSIS — R55 Syncope and collapse: Secondary | ICD-10-CM | POA: Diagnosis not present

## 2016-10-05 DIAGNOSIS — M6281 Muscle weakness (generalized): Secondary | ICD-10-CM | POA: Diagnosis not present

## 2016-10-05 DIAGNOSIS — I69398 Other sequelae of cerebral infarction: Secondary | ICD-10-CM | POA: Diagnosis not present

## 2016-10-05 DIAGNOSIS — R2689 Other abnormalities of gait and mobility: Secondary | ICD-10-CM | POA: Diagnosis not present

## 2016-10-05 DIAGNOSIS — R1312 Dysphagia, oropharyngeal phase: Secondary | ICD-10-CM | POA: Diagnosis not present

## 2016-10-05 DIAGNOSIS — R55 Syncope and collapse: Secondary | ICD-10-CM | POA: Diagnosis not present

## 2016-10-05 DIAGNOSIS — I69391 Dysphagia following cerebral infarction: Secondary | ICD-10-CM | POA: Diagnosis not present

## 2016-10-06 ENCOUNTER — Telehealth: Payer: Self-pay

## 2016-10-06 DIAGNOSIS — R2689 Other abnormalities of gait and mobility: Secondary | ICD-10-CM | POA: Diagnosis not present

## 2016-10-06 DIAGNOSIS — R55 Syncope and collapse: Secondary | ICD-10-CM | POA: Diagnosis not present

## 2016-10-06 DIAGNOSIS — M6281 Muscle weakness (generalized): Secondary | ICD-10-CM | POA: Diagnosis not present

## 2016-10-06 DIAGNOSIS — I69391 Dysphagia following cerebral infarction: Secondary | ICD-10-CM | POA: Diagnosis not present

## 2016-10-06 DIAGNOSIS — R1312 Dysphagia, oropharyngeal phase: Secondary | ICD-10-CM | POA: Diagnosis not present

## 2016-10-06 DIAGNOSIS — I69398 Other sequelae of cerebral infarction: Secondary | ICD-10-CM | POA: Diagnosis not present

## 2016-10-06 NOTE — Telephone Encounter (Signed)
Please make sure that pt and family are aware of this  If you can add it to her chart in some way- please do

## 2016-10-06 NOTE — Telephone Encounter (Signed)
Lauren with [facility] left v/m; today is last treatment day with pt and lauren recommends continue mechanical soft diet, diet supplement that she can drink and crush meds.

## 2016-10-07 DIAGNOSIS — R2689 Other abnormalities of gait and mobility: Secondary | ICD-10-CM | POA: Diagnosis not present

## 2016-10-07 DIAGNOSIS — R1312 Dysphagia, oropharyngeal phase: Secondary | ICD-10-CM | POA: Diagnosis not present

## 2016-10-07 DIAGNOSIS — I69391 Dysphagia following cerebral infarction: Secondary | ICD-10-CM | POA: Diagnosis not present

## 2016-10-07 DIAGNOSIS — R55 Syncope and collapse: Secondary | ICD-10-CM | POA: Diagnosis not present

## 2016-10-07 DIAGNOSIS — M6281 Muscle weakness (generalized): Secondary | ICD-10-CM | POA: Diagnosis not present

## 2016-10-07 DIAGNOSIS — I69398 Other sequelae of cerebral infarction: Secondary | ICD-10-CM | POA: Diagnosis not present

## 2016-10-07 NOTE — Telephone Encounter (Signed)
Left voicemail with Lauren letting her know Dr. Milinda Antisower is in agreement with diet recommendations and I asked her to call us back to let us know what we need to do on our end to keep soft diet order

## 2016-10-07 NOTE — Telephone Encounter (Signed)
Dr. Milinda Antisower did sign order for the modified soft diet order that [facility] us and it was faxed to them yesterday

## 2016-10-11 DIAGNOSIS — I69391 Dysphagia following cerebral infarction: Secondary | ICD-10-CM | POA: Diagnosis not present

## 2016-10-11 DIAGNOSIS — I69398 Other sequelae of cerebral infarction: Secondary | ICD-10-CM | POA: Diagnosis not present

## 2016-10-11 DIAGNOSIS — R1312 Dysphagia, oropharyngeal phase: Secondary | ICD-10-CM | POA: Diagnosis not present

## 2016-10-11 DIAGNOSIS — R55 Syncope and collapse: Secondary | ICD-10-CM | POA: Diagnosis not present

## 2016-10-11 DIAGNOSIS — M6281 Muscle weakness (generalized): Secondary | ICD-10-CM | POA: Diagnosis not present

## 2016-10-11 DIAGNOSIS — R2689 Other abnormalities of gait and mobility: Secondary | ICD-10-CM | POA: Diagnosis not present

## 2016-10-12 DIAGNOSIS — I69398 Other sequelae of cerebral infarction: Secondary | ICD-10-CM | POA: Diagnosis not present

## 2016-10-12 DIAGNOSIS — R2689 Other abnormalities of gait and mobility: Secondary | ICD-10-CM | POA: Diagnosis not present

## 2016-10-12 DIAGNOSIS — I69391 Dysphagia following cerebral infarction: Secondary | ICD-10-CM | POA: Diagnosis not present

## 2016-10-12 DIAGNOSIS — R55 Syncope and collapse: Secondary | ICD-10-CM | POA: Diagnosis not present

## 2016-10-12 DIAGNOSIS — R1312 Dysphagia, oropharyngeal phase: Secondary | ICD-10-CM | POA: Diagnosis not present

## 2016-10-12 DIAGNOSIS — M6281 Muscle weakness (generalized): Secondary | ICD-10-CM | POA: Diagnosis not present

## 2016-10-13 ENCOUNTER — Encounter: Payer: Self-pay | Admitting: Family Medicine

## 2016-10-13 ENCOUNTER — Ambulatory Visit (INDEPENDENT_AMBULATORY_CARE_PROVIDER_SITE_OTHER): Payer: Medicare Other | Admitting: Family Medicine

## 2016-10-13 VITALS — BP 128/60 | HR 113 | Temp 98.6°F | Ht 63.0 in | Wt 101.0 lb

## 2016-10-13 DIAGNOSIS — R4181 Age-related cognitive decline: Secondary | ICD-10-CM | POA: Insufficient documentation

## 2016-10-13 DIAGNOSIS — Z7409 Other reduced mobility: Secondary | ICD-10-CM

## 2016-10-13 DIAGNOSIS — I639 Cerebral infarction, unspecified: Secondary | ICD-10-CM

## 2016-10-13 DIAGNOSIS — I10 Essential (primary) hypertension: Secondary | ICD-10-CM | POA: Diagnosis not present

## 2016-10-13 DIAGNOSIS — S32599S Other specified fracture of unspecified pubis, sequela: Secondary | ICD-10-CM | POA: Diagnosis not present

## 2016-10-13 DIAGNOSIS — S32599A Other specified fracture of unspecified pubis, initial encounter for closed fracture: Secondary | ICD-10-CM | POA: Insufficient documentation

## 2016-10-13 NOTE — Progress Notes (Signed)
Subjective:    Patient ID: [name], female    DOB: 08/11/1920, 83 y.o.   MRN: [id number]  HPI Here for face to face visit for durable medical equipment for chronic medical problems  She was seen in ED for fall 7/8 [facility] and [facility]  Had buttock and hip pain Dg Hip Unilat W Or Wo Pelvis 2-3 Views Right  Result Date: 10/03/2016 CLINICAL DATA:  83 y/o F; status post fall on buttocks with ramus pain. EXAM: DG HIP (WITH OR WITHOUT PELVIS) 2-3V RIGHT COMPARISON:  None. FINDINGS: Cortical irregularity of right inferior pubic ramus may represent a nondisplaced fracture. Bilateral total hip prostheses. No apparent hardware related complication or periprosthetic fracture. Advanced lower lumbar degenerative changes. Vascular calcifications. IMPRESSION: Cortical irregularity of right inferior pubic ramus may represent a nondisplaced fracture. No other fracture or dislocation identified. Electronically Signed   By: Mitzi HansenLance  Furusawa-Stratton M.D.   On: 10/03/2016 18:27   Diagnosed with a stable non displaced R pubic ramus fracture  Was d/c with ultram and rec f/u ortho   Wt Readings from Last 3 Encounters:  10/13/16 101 lb (45.8 kg)  10/03/16 106 lb (48.1 kg)  07/20/16 108 lb 9.6 oz (49.3 kg)   17.89 kg/m   Hx of 2 hip replacements  Also osteopenia   Due to mobility issues- a raised toilet seat was recommended    (had one at home and it helped a lot)  She suffers from chronic knee pain from OA She only walks with a walker    bp is stable today  No cp or palpitations or headaches or edema  No side effects to medicines  BP Readings from Last 3 Encounters:  10/13/16 128/60  10/03/16 131/88  07/26/16 135/62      Lab Results  Component Value Date   WBC 9.2 07/24/2016   HGB 13.7 07/24/2016   HCT 39.9 07/24/2016   MCV 95.1 07/24/2016   PLT 264 07/24/2016     Chemistry      Component Value Date/Time   NA 136 07/24/2016 0439   K 3.9 07/24/2016 0439   CL 108  07/24/2016 0439   CO2 22 07/24/2016 0439   BUN 21 (H) 07/24/2016 0439   CREATININE 0.79 07/24/2016 0439      Component Value Date/Time   CALCIUM 9.3 07/24/2016 0439   ALKPHOS 47 07/20/2016 1218   AST 17 07/20/2016 1218   ALT 10 (L) 07/20/2016 1218   BILITOT 0.9 07/20/2016 1218       Still has some pain in buttocks   Family says she likes to eat pinto beans Not eating much  Unsure if she [facility] the [facility]   More cognitive decline with age  This worries her sisters  She has always turned her feet in   Lab Results  Component Value Date   VITAMINB12 >1500 (H) 02/16/2016   Patient Active Problem List   Diagnosis Date Noted  . Age-related cognitive decline 10/13/2016  . Pubic ramus fracture (HCC) 10/13/2016  . Orthostatic hypotension 07/22/2016  . Fatigue 07/20/2016  . Syncope 07/20/2016  . Elevated TSH 02/11/2015  . Mobility impaired 06/18/2014  . Dystrophic nail 07/11/2013  . Seborrheic keratoses 07/11/2013  . Callus of foot 01/17/2013  . Encounter for Medicare annual wellness exam 07/10/2012  . Hyperglycemia 01/21/2012  . Leg pain, bilateral 10/15/2011  . Ankle edema 10/15/2011  . B12 deficiency 08/22/2008  . Osteopenia 07/09/2008  . Carotid stenosis 11/14/2006  .  Hyperlipidemia LDL goal <130 06/09/2006  . Essential hypertension 06/09/2006  . CONSTIPATION, CHRONIC 06/09/2006  . Osteoarthritis 06/09/2006   Past Medical History:  Diagnosis Date  . Carotid stenosis   . Chronic constipation   . HLD (hyperlipidemia) 2002  . HTN (hypertension)   . Osteoarthritis   . Osteopenia    with arm fracture   Past Surgical History:  Procedure Laterality Date  . Abd [place]  12/07   Negative AAA  . carotid doppler  11/07   50-79 % RICA  . carotid doppler     50-69 % RICA, lessa then 50% LICA-overall no change  . COLONOSCOPY  8/02   Diverticulosis  . DEXA  08/2002   Osteopenia  . DEXA  4/10   Osteopenia (worse at forearm T-1.98)-imp at LS -1.0  . TOTAL HIP  ARTHROPLASTY  6/06  . TOTAL HIP ARTHROPLASTY  10/06   Social History  Substance Use Topics  . Smoking status: Never Smoker  . Smokeless tobacco: Never Used  . Alcohol use No   Family History  Problem Relation Age of Onset  . Heart attack Father   . Mitral valve prolapse Sister    Allergies  Allergen Reactions  . Alendronate Sodium     Leg tingling   . Hctz [Hydrochlorothiazide]     Dehydration/ syncope  . Hydrocodone     Syncope    Current Outpatient Prescriptions on File Prior to Visit  Medication Sig Dispense Refill  . Ascorbic Acid (VITAMIN C PO) Take 500 mg by mouth daily.     Marland Kitchen aspirin 81 MG tablet Take 81 mg by mouth daily.      . Calcium Carb-Cholecalciferol (CALCIUM+D3) 600-800 MG-UNIT TABS Take 1 tablet by mouth daily.    Marland Kitchen docusate sodium (COLACE) 50 MG capsule Take 50 mg by mouth as needed.     . lovastatin (MEVACOR) 20 MG tablet Take 1 tablet (20 mg total) by mouth daily. 30 tablet 11  . Multiple Vitamins-Minerals (PRESERVISION AREDS PO) Take 1 tablet by mouth 2 (two) times daily.     Marland Kitchen olmesartan (BENICAR) 40 MG tablet Take 1 tablet (40 mg total) by mouth daily. 30 tablet 11  . traMADol (ULTRAM) 50 MG tablet Take 1 tablet (50 mg total) by mouth 2 (two) times daily. 10 tablet 0  . vitamin B-12 (CYANOCOBALAMIN) 1000 MCG tablet Take 1,000 mcg by mouth daily.    Marland Kitchen ibuprofen (ADVIL,MOTRIN) 200 MG tablet Take 200 mg by mouth every 6 (six) hours as needed.     . Omega-3 Fatty Acids (FISH OIL PO) Take 1 capsule by mouth 2 (two) times daily.      . vitamin E 400 UNIT capsule Take 400 Units by mouth daily.       No current facility-administered medications on file prior to visit.      Review of Systems    Review of Systems  Constitutional: Negative for fever, , fatigue and unexpected weight change. pos for appetite change  Eyes: Negative for pain and visual disturbance.  Respiratory: Negative for cough and shortness of breath.   Cardiovascular: Negative for cp or  palpitations    Gastrointestinal: Negative for nausea, diarrhea and constipation.  Genitourinary: Negative for urgency and frequency.  Skin: Negative for pallor or rash   MSK pos for chronic knee/LE pain as well as buttock pain from recent pubic ramus fracture  Neurological: Negative for weakness, light-headedness, numbness and headaches  Pos for poor balance .  Hematological: Negative for adenopathy.  Does not bruise/bleed easily.  Psychiatric/Behavioral: Negative for dysphoric mood. The patient is not nervous/anxious.  pos for dementia /cognitive decline with occ confusion and poor short term memory     Objective:   Physical Exam  Constitutional: She appears well-developed and well-nourished. No distress.  Frail appearing elderly female with poor hearing  Sitting in wheelchair-cannot rise w/o assistance    HENT:  Head: Normocephalic and atraumatic.  Nose: Nose normal.  Mouth/Throat: Oropharynx is clear and moist.  Eyes: Pupils are equal, round, and reactive to light. Conjunctivae and EOM are normal. Right eye exhibits no discharge. Left eye exhibits no discharge. No scleral icterus.  Neck: Normal range of motion. Neck supple. No JVD present. Carotid bruit is not present. No thyromegaly present.  Cardiovascular: Normal rate, regular rhythm, normal heart sounds and intact distal pulses.  Exam reveals no gallop.   Pulmonary/Chest: Effort normal and breath sounds normal. No respiratory distress. She has no wheezes. She has no rales.  No crackles  Abdominal: Soft. Bowel sounds are normal. She exhibits no distension, no abdominal bruit and no mass. There is no tenderness.  Musculoskeletal: She exhibits no edema or tenderness.  Ataxic gait Feet turn in bilaterally   Tender over bilat buttocks  Unable to rise or walk w/o assistance today    Lymphadenopathy:    She has no cervical adenopathy.  Neurological: She is alert. She has normal reflexes. No cranial nerve deficit. She exhibits  normal muscle tone. Coordination normal.  General weakness noted   Skin: Skin is warm and dry. No rash noted. No pallor.  Psychiatric: Her affect is blunt. Her speech is delayed. She is slowed. She is not agitated and not aggressive. Thought content is not paranoid. Cognition and memory are impaired. She expresses no homicidal and no suicidal ideation. She exhibits abnormal recent memory.  Dementia noted with poor short term memory  Also very HOH Helpful sisters are present  Pt almost dozes off occasionally  Answers most questions           Assessment & Plan:   Problem List Items Addressed This Visit      Cardiovascular and Mediastinum   Essential hypertension - Primary    bp in fair control at this time  BP Readings from Last 1 Encounters:  10/13/16 128/60   No changes needed Disc lifstyle change with low sodium diet and exercise  No more orthostatic episodes Disc imp of fluids to prevent dehydration  Continue benicar and monitor renal status         Musculoskeletal and Integument   Pubic ramus fracture (HCC)    Gradually improving  Pt needs more help with mobility - using walker with assistance Cannot sit on a low toilet seat due to pain from fracture and lack of strength  Px written for raised toilet seat to use at her residence This is medically necessary for mt of independence and face to face eval was done today        Other   Age-related cognitive decline    Family notes this  Suspect age related at 89  Affecting appetite Still able to accomplish some ADLs with promting  Disc pros/cons of medication for dementia  For now -will hold off (due to fear of side effects) Continue routine and assistance as needed       Mobility impaired    Px for raised toilet seat Ataxic gait with turned in feet Chronic knee pain  Recent pelvic fracture  She cannot rise  from wheelchair or walk with walker w/o assistance at this time

## 2016-10-13 NOTE — Patient Instructions (Addendum)
Encourage her to eat more - meals and snacks (with mechanical soft diet)  Encourage fluids   Also encourage socialization  I do suspect ongoing cognitive decline with age- we will watch this   I think a raised toilet seat will help  Continue tramadol for pain as needed   I agree to stay off the ibuprofen and omega 3 and vitamin E  Continue other medicines

## 2016-10-14 DIAGNOSIS — R1312 Dysphagia, oropharyngeal phase: Secondary | ICD-10-CM | POA: Diagnosis not present

## 2016-10-14 DIAGNOSIS — I69391 Dysphagia following cerebral infarction: Secondary | ICD-10-CM | POA: Diagnosis not present

## 2016-10-14 DIAGNOSIS — M6281 Muscle weakness (generalized): Secondary | ICD-10-CM | POA: Diagnosis not present

## 2016-10-14 DIAGNOSIS — I69398 Other sequelae of cerebral infarction: Secondary | ICD-10-CM | POA: Diagnosis not present

## 2016-10-14 DIAGNOSIS — R55 Syncope and collapse: Secondary | ICD-10-CM | POA: Diagnosis not present

## 2016-10-14 DIAGNOSIS — R2689 Other abnormalities of gait and mobility: Secondary | ICD-10-CM | POA: Diagnosis not present

## 2016-10-14 NOTE — Assessment & Plan Note (Signed)
Px for raised toilet seat Ataxic gait with turned in feet Chronic knee pain  Recent pelvic fracture  She cannot rise from wheelchair or walk with walker w/o assistance at this time

## 2016-10-14 NOTE — Assessment & Plan Note (Signed)
Family notes this  Suspect age related at [id number]  Affecting appetite Still able to accomplish some ADLs with promting  Disc pros/cons of medication for dementia  For now -will hold off (due to fear of side effects) Continue routine and assistance as needed

## 2016-10-14 NOTE — Assessment & Plan Note (Signed)
bp in fair control at this time  BP Readings from Last 1 Encounters:  10/13/16 128/60   No changes needed Disc lifstyle change with low sodium diet and exercise  No more orthostatic episodes Disc imp of fluids to prevent dehydration  Continue benicar and monitor renal status

## 2016-10-14 NOTE — Assessment & Plan Note (Signed)
Gradually improving  Pt needs more help with mobility - using walker with assistance Cannot sit on a low toilet seat due to pain from fracture and lack of strength  Px written for raised toilet seat to use at her residence This is medically necessary for mt of independence and face to face eval was done today

## 2016-10-15 ENCOUNTER — Encounter (INDEPENDENT_AMBULATORY_CARE_PROVIDER_SITE_OTHER): Payer: Medicare Other

## 2016-10-15 ENCOUNTER — Ambulatory Visit (INDEPENDENT_AMBULATORY_CARE_PROVIDER_SITE_OTHER): Payer: Medicare Other | Admitting: Vascular Surgery

## 2016-10-18 ENCOUNTER — Other Ambulatory Visit (INDEPENDENT_AMBULATORY_CARE_PROVIDER_SITE_OTHER): Payer: Self-pay | Admitting: Vascular Surgery

## 2016-10-18 ENCOUNTER — Telehealth: Payer: Self-pay | Admitting: *Deleted

## 2016-10-18 DIAGNOSIS — R2689 Other abnormalities of gait and mobility: Secondary | ICD-10-CM | POA: Diagnosis not present

## 2016-10-18 DIAGNOSIS — R55 Syncope and collapse: Secondary | ICD-10-CM | POA: Diagnosis not present

## 2016-10-18 DIAGNOSIS — I69391 Dysphagia following cerebral infarction: Secondary | ICD-10-CM | POA: Diagnosis not present

## 2016-10-18 DIAGNOSIS — I779 Disorder of arteries and arterioles, unspecified: Secondary | ICD-10-CM

## 2016-10-18 DIAGNOSIS — I739 Peripheral vascular disease, unspecified: Secondary | ICD-10-CM

## 2016-10-18 DIAGNOSIS — M6281 Muscle weakness (generalized): Secondary | ICD-10-CM | POA: Diagnosis not present

## 2016-10-18 DIAGNOSIS — R1312 Dysphagia, oropharyngeal phase: Secondary | ICD-10-CM | POA: Diagnosis not present

## 2016-10-18 DIAGNOSIS — I69398 Other sequelae of cerebral infarction: Secondary | ICD-10-CM | POA: Diagnosis not present

## 2016-10-18 NOTE — Telephone Encounter (Signed)
[name] wanted to advise Dr Milinda Antisower pt will be d/c from PT today.

## 2016-10-18 NOTE — Telephone Encounter (Signed)
Thanks for letting me know!

## 2016-10-19 ENCOUNTER — Ambulatory Visit (INDEPENDENT_AMBULATORY_CARE_PROVIDER_SITE_OTHER): Payer: Medicare Other

## 2016-10-19 ENCOUNTER — Encounter (INDEPENDENT_AMBULATORY_CARE_PROVIDER_SITE_OTHER): Payer: Self-pay | Admitting: Vascular Surgery

## 2016-10-19 ENCOUNTER — Ambulatory Visit (INDEPENDENT_AMBULATORY_CARE_PROVIDER_SITE_OTHER): Payer: Medicare Other | Admitting: Vascular Surgery

## 2016-10-19 VITALS — BP 153/74 | HR 79 | Resp 17 | Ht 66.0 in | Wt 102.0 lb

## 2016-10-19 DIAGNOSIS — E785 Hyperlipidemia, unspecified: Secondary | ICD-10-CM

## 2016-10-19 DIAGNOSIS — I69391 Dysphagia following cerebral infarction: Secondary | ICD-10-CM | POA: Diagnosis not present

## 2016-10-19 DIAGNOSIS — I739 Peripheral vascular disease, unspecified: Secondary | ICD-10-CM

## 2016-10-19 DIAGNOSIS — I779 Disorder of arteries and arterioles, unspecified: Secondary | ICD-10-CM | POA: Diagnosis not present

## 2016-10-19 DIAGNOSIS — I6523 Occlusion and stenosis of bilateral carotid arteries: Secondary | ICD-10-CM

## 2016-10-19 DIAGNOSIS — I10 Essential (primary) hypertension: Secondary | ICD-10-CM

## 2016-10-19 DIAGNOSIS — M6281 Muscle weakness (generalized): Secondary | ICD-10-CM | POA: Diagnosis not present

## 2016-10-19 DIAGNOSIS — R2689 Other abnormalities of gait and mobility: Secondary | ICD-10-CM | POA: Diagnosis not present

## 2016-10-19 DIAGNOSIS — R55 Syncope and collapse: Secondary | ICD-10-CM | POA: Diagnosis not present

## 2016-10-19 DIAGNOSIS — I69398 Other sequelae of cerebral infarction: Secondary | ICD-10-CM | POA: Diagnosis not present

## 2016-10-19 DIAGNOSIS — I639 Cerebral infarction, unspecified: Secondary | ICD-10-CM | POA: Diagnosis not present

## 2016-10-19 DIAGNOSIS — R1312 Dysphagia, oropharyngeal phase: Secondary | ICD-10-CM | POA: Diagnosis not present

## 2016-10-19 MED ORDER — ASPIRIN EC 81 MG PO TBEC: 81.0000 mg | DELAYED_RELEASE_TABLET | Freq: Every day | ORAL | Status: AC

## 2016-10-19 NOTE — Assessment & Plan Note (Signed)
blood pressure control important in reducing the progression of atherosclerotic disease. On appropriate oral medications.  

## 2016-10-19 NOTE — Assessment & Plan Note (Signed)
Her carotid duplex today does show a significant progression of her carotid artery stenosis now measuring in the 80-99% on the right. Her left carotid artery stenosis remains in the mild, 1-39% range. This is a very difficult situation at this point. With her advanced age and clearly declining status, she is really a very poor surgical candidate. I would recommend she start taking aspirin daily. I would also recommend consideration for the addition of a statin therapy if she can tolerate this. I would not recommend surgery at this point, but the woman with her really wants to discuss this with the family and she has a pretty poor historian herself. I'll plan to see her back in about 6 months.

## 2016-10-19 NOTE — Progress Notes (Signed)
MRN : [id number]  [name] is a 83 y.o. ([birth year]) female who presents with chief complaint of  Chief Complaint  Patient presents with  . Carotid    ultrasound  .  History of Present Illness: Patient presents in follow-up of multiple vascular issues. It sounds like her overall health has declined and she is now in a nursing home. She and the woman who is with her today are both very poor historians. They do not report any obvious focal neurologic symptoms. Specifically, the patient denies amaurosis fugax, speech or swallowing difficulties, or arm or leg weakness or numbness. Her carotid duplex today does show a significant progression of her carotid artery stenosis now measuring in the 80-99% on the right. Her left carotid artery stenosis remains in the mild, 1-39% range. Her ABIs today remained normal at 1.0 bilaterally. She does not have any new ulcerations, infection, or ischemic rest pain. She is in a wheelchair and really not walking much at this point.  Current Outpatient Prescriptions  Medication Sig Dispense Refill  . acetaminophen (TYLENOL) 325 MG tablet Take 650 mg by mouth 2 (two) times daily as needed.    . Ascorbic Acid (VITAMIN C PO) Take 500 mg by mouth daily.     Marland Kitchen aspirin 81 MG tablet Take 81 mg by mouth daily.      . Calcium Carb-Cholecalciferol (CALCIUM+D3) 600-800 MG-UNIT TABS Take 1 tablet by mouth daily.    Marland Kitchen docusate sodium (COLACE) 50 MG capsule Take 50 mg by mouth as needed.     Marland Kitchen ibuprofen (ADVIL,MOTRIN) 200 MG tablet Take 200 mg by mouth every 6 (six) hours as needed.     . lovastatin (MEVACOR) 20 MG tablet Take 1 tablet (20 mg total) by mouth daily. 30 tablet 11  . Multiple Vitamins-Minerals (PRESERVISION AREDS PO) Take 1 tablet by mouth 2 (two) times daily.     Marland Kitchen olmesartan (BENICAR) 40 MG tablet Take 1 tablet (40 mg total) by mouth daily. 30 tablet 11  . Omega-3 Fatty Acids (FISH OIL PO) Take 1 capsule by mouth 2 (two) times daily.      . traMADol  (ULTRAM) 50 MG tablet Take 1 tablet (50 mg total) by mouth 2 (two) times daily. 10 tablet 0  . vitamin B-12 (CYANOCOBALAMIN) 1000 MCG tablet Take 1,000 mcg by mouth daily.    . vitamin E 400 UNIT capsule Take 400 Units by mouth daily.       Current Facility-Administered Medications  Medication Dose Route Frequency Provider Last Rate Last Dose  . aspirin EC tablet 81 mg  81 mg Oral Daily Dew, Marlow Baars, MD        Past Medical History:  Diagnosis Date  . Carotid stenosis   . Chronic constipation   . HLD (hyperlipidemia) 2002  . HTN (hypertension)   . Osteoarthritis   . Osteopenia    with arm fracture    Past Surgical History:  Procedure Laterality Date  . Abd [place]  12/07   Negative AAA  . carotid doppler  11/07   50-79 % RICA  . carotid doppler     50-69 % RICA, lessa then 50% LICA-overall no change  . COLONOSCOPY  8/02   Diverticulosis  . DEXA  08/2002   Osteopenia  . DEXA  4/10   Osteopenia (worse at forearm T-1.98)-imp at LS -1.0  . TOTAL HIP ARTHROPLASTY  6/06  . TOTAL HIP ARTHROPLASTY  10/06    Social History Social History  Substance Use Topics  . Smoking status: Never Smoker  . Smokeless tobacco: Never Used  . Alcohol use No  Living in a facility No IV drug use  Family History Family History  Problem Relation Age of Onset  . Heart attack Father   . Mitral valve prolapse Sister   No bleeding disorders, clotting disorders, or aneurysms  Allergies  Allergen Reactions  . Alendronate Sodium     Leg tingling   . Hctz [Hydrochlorothiazide]     Dehydration/ syncope  . Hydrocodone     Syncope      REVIEW OF SYSTEMS (Negative unless checked) Really impossible to obtain due to the poor historian status of she and the woman with her  Physical Examination  Vitals:   10/19/16 1506 10/19/16 1510  BP: (!) 161/77 (!) 153/74  Pulse: 79   Resp: 17   Weight: 102 lb (46.3 kg)   Height: 5\' 6"  (1.676 m)    Body mass index is 16.46 kg/m. Gen:  Elderly and  debilitated-appearing Head: [place]/AT, + temporalis wasting. Ear/Nose/Throat: Hearing grossly intact, nares w/o erythema or drainage, trachea midline Eyes: Conjunctiva clear. Sclera non-icteric Neck: Supple.  No JVD.  right carotid bruit is present  Pulmonary:  Good air movement, equal and clear to auscultation bilaterally.  Cardiac: Irregular  Vascular:  Vessel Right Left  Radial Palpable Palpable                          PT Palpable Palpable  DP Palpable 1+ Palpable   Gastrointestinal: soft, non-tender/non-distended.  Musculoskeletal: M/S 5/5 throughout.  No deformity or atrophy. Minimal lower extremitya. Neurologic: CN 2-12 intact. Sensation grossly intact in extremities.  Symmetrical.  uses a wheelchair. Barely speaks.  Psychiatric: Judgment and insight are poor. She is a poor historian  Dermatologic: No rashes or ulcers noted.  No cellulitis or open wounds.      CBC Lab Results  Component Value Date   WBC 9.2 07/24/2016   HGB 13.7 07/24/2016   HCT 39.9 07/24/2016   MCV 95.1 07/24/2016   PLT 264 07/24/2016    BMET    Component Value Date/Time   NA 136 07/24/2016 0439   K 3.9 07/24/2016 0439   CL 108 07/24/2016 0439   CO2 22 07/24/2016 0439   GLUCOSE 111 (H) 07/24/2016 0439   BUN 21 (H) 07/24/2016 0439   CREATININE 0.79 07/24/2016 0439   CALCIUM 9.3 07/24/2016 0439   GFRNONAA >60 07/24/2016 0439   GFRAA >60 07/24/2016 0439   CrCl cannot be calculated (Patient's most recent lab result is older than the maximum 21 days allowed.).  COAG No results found for: INR, PROTIME  Radiology Dg Hip Unilat W Or Wo Pelvis 2-3 Views Right  Result Date: 10/03/2016 CLINICAL DATA:  83 y/o F; status post fall on buttocks with ramus pain. EXAM: DG HIP (WITH OR WITHOUT PELVIS) 2-3V RIGHT COMPARISON:  None. FINDINGS: Cortical irregularity of right inferior pubic ramus may represent a nondisplaced fracture. Bilateral total hip prostheses. No apparent hardware related complication  or periprosthetic fracture. Advanced lower lumbar degenerative changes. Vascular calcifications. IMPRESSION: Cortical irregularity of right inferior pubic ramus may represent a nondisplaced fracture. No other fracture or dislocation identified. Electronically Signed   By: Mitzi Hansen M.D.   On: 10/03/2016 18:27     Assessment/Plan Essential hypertension blood pressure control important in reducing the progression of atherosclerotic disease. On appropriate oral medications.   Hyperlipidemia LDL goal <130 lipid control  important in reducing the progression of atherosclerotic disease. Consider statin therapy   PVD (peripheral vascular disease) (HCC) Her ABIs today remained normal at 1.0 bilaterally. Her maintain perfusion today, no intervention is required. Plan to recheck in 1 year  Carotid stenosis Her carotid duplex today does show a significant progression of her carotid artery stenosis now measuring in the 80-99% on the right. Her left carotid artery stenosis remains in the mild, 1-39% range. This is a very difficult situation at this point. With her advanced age and clearly declining status, she is really a very poor surgical candidate. I would recommend she start taking aspirin daily. I would also recommend consideration for the addition of a statin therapy if she can tolerate this. I would not recommend surgery at this point, but the woman with her really wants to discuss this with the family and she has a pretty poor historian herself. I'll plan to see her back in about 6 months.    Festus BarrenJason Dew, MD  10/19/2016 4:18 PM    This note was created with Dragon medical transcription system.  Any errors from dictation are purely unintentional

## 2016-10-19 NOTE — Assessment & Plan Note (Signed)
lipid control important in reducing the progression of atherosclerotic disease. Consider statin therapy  

## 2016-10-19 NOTE — Assessment & Plan Note (Signed)
Her ABIs today remained normal at 1.0 bilaterally. Her maintain perfusion today, no intervention is required. Plan to recheck in 1 year

## 2016-10-19 NOTE — Patient Instructions (Signed)
Carotid Artery Disease The carotid arteries are arteries on both sides of the neck. They carry blood to the brain. Carotid artery disease is when the arteries get smaller (narrow) or get blocked. If these arteries get smaller or get blocked, you are more likely to have a stroke or warning stroke (transient ischemic attack). Follow these instructions at home:  Take medicines as told by your doctor. Make sure you understand all your medicine instructions. Do not stop your medicines without talking to your doctor first.  Follow your doctor's diet instructions. It is important to eat a healthy diet that includes plenty of: ? Fresh fruits. ? Vegetables. ? Lean meats.  Avoid: ? High-fat foods. ? High-sodium foods. ? Foods that are fried, overly processed, or have poor nutritional value.  Stay a healthy weight.  Stay active. Get at least 30 minutes of activity every day.  Do not smoke.  Limit alcohol use to: ? No more than 2 drinks a day for men. ? No more than 1 drink a day for women who are not pregnant.  Do not use illegal drugs.  Keep all doctor visits as told. Get help right away if:  You have sudden weakness or loss of feeling (numbness) on one side of the body, such as the face, arm, or leg.  You have sudden confusion.  You have trouble speaking (aphasia) or understanding.  You have sudden trouble seeing out of one or both eyes.  You have sudden trouble walking.  You have dizziness or feel like you might pass out (faint).  You have a loss of balance or your movements are not steady (uncoordinated).  You have a sudden, severe headache with no known cause.  You have trouble swallowing (dysphagia). Call your local emergency services (911 in [place] Do notdrive yourself to the clinic or hospital. This information is not intended to replace advice given to you by your health care provider. Make sure you discuss any questions you have with your health care  provider. Document Released: 03/01/2012 Document Revised: 08/21/2015 Document Reviewed: 09/13/2012 Elsevier Interactive Patient Education  2018 Elsevier Inc.  

## 2016-10-20 ENCOUNTER — Ambulatory Visit: Payer: Medicare Other | Admitting: Podiatry

## 2016-10-21 DIAGNOSIS — M6281 Muscle weakness (generalized): Secondary | ICD-10-CM | POA: Diagnosis not present

## 2016-10-21 DIAGNOSIS — R1312 Dysphagia, oropharyngeal phase: Secondary | ICD-10-CM | POA: Diagnosis not present

## 2016-10-21 DIAGNOSIS — I69398 Other sequelae of cerebral infarction: Secondary | ICD-10-CM | POA: Diagnosis not present

## 2016-10-21 DIAGNOSIS — R2689 Other abnormalities of gait and mobility: Secondary | ICD-10-CM | POA: Diagnosis not present

## 2016-10-21 DIAGNOSIS — I69391 Dysphagia following cerebral infarction: Secondary | ICD-10-CM | POA: Diagnosis not present

## 2016-10-21 DIAGNOSIS — R55 Syncope and collapse: Secondary | ICD-10-CM | POA: Diagnosis not present

## 2016-10-26 DIAGNOSIS — H31091 Other chorioretinal scars, right eye: Secondary | ICD-10-CM | POA: Diagnosis not present

## 2016-10-26 DIAGNOSIS — Z961 Presence of intraocular lens: Secondary | ICD-10-CM | POA: Diagnosis not present

## 2016-10-26 DIAGNOSIS — H353131 Nonexudative age-related macular degeneration, bilateral, early dry stage: Secondary | ICD-10-CM | POA: Diagnosis not present

## 2016-10-26 DIAGNOSIS — H40013 Open angle with borderline findings, low risk, bilateral: Secondary | ICD-10-CM | POA: Diagnosis not present

## 2016-10-27 ENCOUNTER — Ambulatory Visit (INDEPENDENT_AMBULATORY_CARE_PROVIDER_SITE_OTHER): Payer: Medicare Other | Admitting: Podiatry

## 2016-10-27 DIAGNOSIS — M79676 Pain in unspecified toe(s): Secondary | ICD-10-CM | POA: Diagnosis not present

## 2016-10-27 DIAGNOSIS — B351 Tinea unguium: Secondary | ICD-10-CM

## 2016-10-27 NOTE — Progress Notes (Signed)
She presents today chief complaint of painful elongated toenails. She has a small area that is tender around the big toe.  Objective: Vital signs are stable alert and oriented 3 and has a long thick yellow dystrophic with mycotic and painful palpation. His mild paronychia which I debrided today and which looks like is going on to heal uneventfully. There is no cellulitis drainage or odor.  Assessment: Pain in limb segment onychomycosis and mild paronychia hallux right.  Plan: Debated already hyperkeratotic tissue today debridement nails 1 through 5 bilateral. Follow up with her as needed.

## 2016-11-02 ENCOUNTER — Other Ambulatory Visit (INDEPENDENT_AMBULATORY_CARE_PROVIDER_SITE_OTHER): Payer: Medicare Other

## 2016-11-02 ENCOUNTER — Telehealth: Payer: Self-pay

## 2016-11-02 ENCOUNTER — Encounter: Payer: Self-pay | Admitting: Family Medicine

## 2016-11-02 DIAGNOSIS — R410 Disorientation, unspecified: Secondary | ICD-10-CM

## 2016-11-02 DIAGNOSIS — R8290 Unspecified abnormal findings in urine: Secondary | ICD-10-CM | POA: Diagnosis not present

## 2016-11-02 DIAGNOSIS — F0390 Unspecified dementia without behavioral disturbance: Secondary | ICD-10-CM | POA: Insufficient documentation

## 2016-11-02 LAB — POC URINALSYSI DIPSTICK (AUTOMATED)
Bilirubin, UA: NEGATIVE
Blood, UA: NEGATIVE
Glucose, UA: NEGATIVE
Ketones, UA: NEGATIVE
Nitrite, UA: NEGATIVE
Spec Grav, UA: 1.025 (ref 1.010–1.025)
UROBILINOGEN UA: 1 U/dL
pH, UA: 6 (ref 5.0–8.0)

## 2016-11-02 NOTE — Telephone Encounter (Signed)
Signed and in IN box 

## 2016-11-02 NOTE — Telephone Encounter (Signed)
[name] with [name]; on 11/01/16 pt tried to leave the building; for safety needs to move pt to memory care division. Lisa request updated FL2 which would include dementia; pt does not have current dx of dementia. [name] request to be faxed back to [facility] in [place].Please advise.

## 2016-11-02 NOTE — Telephone Encounter (Signed)
Looks like they dropped off urine-please do ua and cx for mental status change and frequent urination  Please check and see that family is aware of the request to change to memory care Do we have blank FL2s?  Thanks

## 2016-11-02 NOTE — Telephone Encounter (Signed)
Spoke with Misty StanleyLisa and the family was there when I called and they are aware of the change to memory care and signed the agreement with [facility] to change her to memory care. We do have blank FL2 forms in the office, I placed one in your inbox but Misty StanleyLisa said she will send over a update list of pt's meds, I gave her our direct fax #  I also put UA/ culture in pt's chart

## 2016-11-03 NOTE — Telephone Encounter (Signed)
Form faxed and sent to scanning

## 2016-11-04 ENCOUNTER — Emergency Department
Admission: EM | Admit: 2016-11-04 | Discharge: 2016-11-04 | Disposition: A | Discharge status: Home / Self Care | Payer: Medicare Other | Attending: Emergency Medicine | Admitting: Emergency Medicine

## 2016-11-04 ENCOUNTER — Emergency Department: Payer: Medicare Other

## 2016-11-04 ENCOUNTER — Telehealth: Payer: Self-pay

## 2016-11-04 ENCOUNTER — Encounter: Payer: Self-pay | Admitting: Emergency Medicine

## 2016-11-04 DIAGNOSIS — S06320A Contusion and laceration of left cerebrum without loss of consciousness, initial encounter: Secondary | ICD-10-CM

## 2016-11-04 DIAGNOSIS — W19XXXA Unspecified fall, initial encounter: Secondary | ICD-10-CM | POA: Diagnosis not present

## 2016-11-04 DIAGNOSIS — I6200 Nontraumatic subdural hemorrhage, unspecified: Secondary | ICD-10-CM | POA: Insufficient documentation

## 2016-11-04 DIAGNOSIS — R262 Difficulty in walking, not elsewhere classified: Secondary | ICD-10-CM | POA: Diagnosis not present

## 2016-11-04 DIAGNOSIS — S3993XA Unspecified injury of pelvis, initial encounter: Secondary | ICD-10-CM | POA: Diagnosis not present

## 2016-11-04 DIAGNOSIS — S065X9A Traumatic subdural hemorrhage with loss of consciousness of unspecified duration, initial encounter: Secondary | ICD-10-CM

## 2016-11-04 DIAGNOSIS — Z743 Need for continuous supervision: Secondary | ICD-10-CM | POA: Diagnosis not present

## 2016-11-04 DIAGNOSIS — S065XAA Traumatic subdural hemorrhage with loss of consciousness status unknown, initial encounter: Secondary | ICD-10-CM

## 2016-11-04 DIAGNOSIS — I10 Essential (primary) hypertension: Secondary | ICD-10-CM | POA: Diagnosis not present

## 2016-11-04 DIAGNOSIS — S066X0A Traumatic subarachnoid hemorrhage without loss of consciousness, initial encounter: Secondary | ICD-10-CM | POA: Diagnosis not present

## 2016-11-04 DIAGNOSIS — Z79899 Other long term (current) drug therapy: Secondary | ICD-10-CM | POA: Diagnosis not present

## 2016-11-04 DIAGNOSIS — I609 Nontraumatic subarachnoid hemorrhage, unspecified: Secondary | ICD-10-CM | POA: Insufficient documentation

## 2016-11-04 DIAGNOSIS — S0083XA Contusion of other part of head, initial encounter: Secondary | ICD-10-CM | POA: Diagnosis not present

## 2016-11-04 DIAGNOSIS — S065X0A Traumatic subdural hemorrhage without loss of consciousness, initial encounter: Secondary | ICD-10-CM | POA: Diagnosis not present

## 2016-11-04 DIAGNOSIS — Z7982 Long term (current) use of aspirin: Secondary | ICD-10-CM | POA: Diagnosis not present

## 2016-11-04 DIAGNOSIS — R6889 Other general symptoms and signs: Secondary | ICD-10-CM | POA: Diagnosis not present

## 2016-11-04 DIAGNOSIS — R4182 Altered mental status, unspecified: Secondary | ICD-10-CM | POA: Diagnosis present

## 2016-11-04 DIAGNOSIS — R296 Repeated falls: Secondary | ICD-10-CM | POA: Diagnosis not present

## 2016-11-04 LAB — CBC
HEMATOCRIT: 33.5 % — AB (ref 35.0–47.0)
Hemoglobin: 11.1 g/dL — ABNORMAL LOW (ref 12.0–16.0)
MCH: 32.3 pg (ref 26.0–34.0)
MCHC: 33.1 g/dL (ref 32.0–36.0)
MCV: 97.7 fL (ref 80.0–100.0)
PLATELETS: 228 10*3/uL (ref 150–440)
RBC: 3.43 MIL/uL — ABNORMAL LOW (ref 3.80–5.20)
RDW: 14.4 % (ref 11.5–14.5)
WBC: 11.3 10*3/uL — ABNORMAL HIGH (ref 3.6–11.0)

## 2016-11-04 LAB — URINALYSIS, COMPLETE (UACMP) WITH MICROSCOPIC
BACTERIA UA: NONE SEEN
Bilirubin Urine: NEGATIVE
Hgb urine dipstick: NEGATIVE
Ketones, ur: 20 mg/dL — AB
Leukocytes, UA: NEGATIVE
NITRITE: NEGATIVE
PROTEIN: 30 mg/dL — AB
RBC / HPF: NONE SEEN RBC/hpf (ref 0–5)
SPECIFIC GRAVITY, URINE: 1.017 (ref 1.005–1.030)
pH: 5 (ref 5.0–8.0)

## 2016-11-04 LAB — COMPREHENSIVE METABOLIC PANEL
ALBUMIN: 3.5 g/dL (ref 3.5–5.0)
ALK PHOS: 62 U/L (ref 38–126)
ALT: 12 U/L — ABNORMAL LOW (ref 14–54)
AST: 27 U/L (ref 15–41)
Anion gap: 10 (ref 5–15)
BILIRUBIN TOTAL: 1.1 mg/dL (ref 0.3–1.2)
BUN: 20 mg/dL (ref 6–20)
CO2: 23 mmol/L (ref 22–32)
CREATININE: 0.84 mg/dL (ref 0.44–1.00)
Calcium: 8.9 mg/dL (ref 8.9–10.3)
Chloride: 102 mmol/L (ref 101–111)
GFR calc Af Amer: >60 mL/min (ref >60)
GFR, EST NON AFRICAN AMERICAN: 58 mL/min — AB (ref >60)
GLUCOSE: 249 mg/dL — AB (ref 65–99)
POTASSIUM: 3.7 mmol/L (ref 3.5–5.1)
Sodium: 135 mmol/L (ref 135–145)
TOTAL PROTEIN: 6.2 g/dL — AB (ref 6.5–8.1)

## 2016-11-04 LAB — TSH: TSH: 0.777 u[IU]/mL (ref 0.350–4.500)

## 2016-11-04 LAB — URINE CULTURE

## 2016-11-04 LAB — TROPONIN I: TROPONIN I: 0.62 ng/mL — AB (ref <0.03)

## 2016-11-04 MED ORDER — SODIUM CHLORIDE 0.9 % IV SOLN
1000.0000 mg | Freq: Once | INTRAVENOUS | Status: AC
  Administered 2016-11-04: 1000 mg via INTRAVENOUS
  Filled 2016-11-04: qty 10

## 2016-11-04 NOTE — ED Notes (Signed)
Pt transported back to [facility] per [facility].

## 2016-11-04 NOTE — Care Management Note (Addendum)
Case Management Note  Patient Details  Name: Gomez CleverlyBeatrice N Locy MRN: [id number] Date of Birth: 10/26/1920  Subjective/Objective:     Asked to see the family at pt. Bedside, regarding Hospice. I have explained to them that Hospice care could be provided at [facility]. The 2 sisters want to wait for the sibling who has power of attorney to be present for the talk.    The family , including the POA, Kathie RhodesBetty, have chosen [facility] , when given the options. I will send Dayna Barkerkaren Robertson at [phone] a text, as I know she is in another room doing a referral. I have made the MD aware that Clydie BraunKaren will be down to see them to go over payment details and what to expect.          Action/Plan:   Expected Discharge Date:                  Expected Discharge Plan:     In-House Referral:     Discharge planning Services     Post Acute Care Choice:    Choice offered to:     DME Arranged:    DME Agency:     HH Arranged:    HH Agency:     Status of Service:     If discussed at [employer] Length of Stay Meetings, dates discussed:    Additional Comments:  Berna BueCheryl Vonette Grosso, RN 11/04/2016, 2:52 PM

## 2016-11-04 NOTE — ED Notes (Signed)
Patient transported to X-ray 

## 2016-11-04 NOTE — ED Notes (Signed)
Patient transported to CT 

## 2016-11-04 NOTE — ED Notes (Signed)
Unable to keep pt on the monitor; pt continues to pull leads off every time they are placed.  MD aware.

## 2016-11-04 NOTE — ED Notes (Signed)
Upon reassessment, pt is verbal at this time; see updated Neuro assessment.

## 2016-11-04 NOTE — Consult Note (Signed)
Referring Physician:  No referring provider defined for this encounter.  Primary Physician:  Tower, Audrie Gallus, MD  Chief Complaint:  Left temporal contusion  History of Present Illness: [name] is a 83 y.o. female who presents to the ED with the chief complaint of left temporal contusion following a fall yesterday. Neurosurgery was consulted to evaluate if surgical intervention was appropriate for condition. Patient was unresponsive at time of visit (going in and out according to ED nurse) so history obtained from ER provider notes is as follows:   [name] is a 83 y.o. female with a history of dementia as well as PVD on aspirin who is presenting to the emergency department with multiple falls and worsening mental status as of today. EMS reports that she has needed to the [place] facility and at her baseline mental status was said to be unknown by the staff prior to arrival. However, it was reported that she was "worse" today. Patient is not responding to questions at this time and therefore history is limited. EMS reported a ecchymosis to the right side of the head.  Of note, she has been declining cognitively for months.  The symptoms are causing a significant impact on the patient's life.   Review of Systems:  A 10 point review of systems is negative, except for the pertinent positives and negatives detailed in the HPI.  Past Medical History: Past Medical History:  Diagnosis Date  . Carotid stenosis   . Chronic constipation   . HLD (hyperlipidemia) 2002  . HTN (hypertension)   . Osteoarthritis   . Osteopenia    with arm fracture    Past Surgical History: Past Surgical History:  Procedure Laterality Date  . Abd [place]  12/07   Negative AAA  . carotid doppler  11/07   50-79 % RICA  . carotid doppler     50-69 % RICA, lessa then 50% LICA-overall no change  . COLONOSCOPY  8/02   Diverticulosis  . DEXA  08/2002   Osteopenia  . DEXA  4/10   Osteopenia (worse  at forearm T-1.98)-imp at LS -1.0  . TOTAL HIP ARTHROPLASTY  6/06  . TOTAL HIP ARTHROPLASTY  10/06    Allergies: Allergies as of 11/04/2016 - Review Complete 11/04/2016  Allergen Reaction Noted  . Alendronate sodium  11/20/2010  . Hctz [hydrochlorothiazide]  11/20/2010  . Hydrocodone  11/20/2010    Medications:  Current Facility-Administered Medications:  .  aspirin EC tablet 81 mg, 81 mg, Oral, Daily, Dew, Marlow Baars, MD  Current Outpatient Prescriptions:  .  Ascorbic Acid (VITAMIN C PO), Take 500 mg by mouth daily. , Disp: , Rfl:  .  aspirin 81 MG tablet, Take 81 mg by mouth daily.  , Disp: , Rfl:  .  Calcium Carb-Cholecalciferol (CALCIUM+D3) 600-800 MG-UNIT TABS, Take 1 tablet by mouth daily., Disp: , Rfl:  .  lovastatin (MEVACOR) 20 MG tablet, Take 1 tablet (20 mg total) by mouth daily., Disp: 30 tablet, Rfl: 11 .  Multiple Vitamins-Minerals (PRESERVISION AREDS PO), Take 1 tablet by mouth 2 (two) times daily. , Disp: , Rfl:  .  olmesartan (BENICAR) 40 MG tablet, Take 1 tablet (40 mg total) by mouth daily., Disp: 30 tablet, Rfl: 11 .  vitamin B-12 (CYANOCOBALAMIN) 1000 MCG tablet, Take 1,000 mcg by mouth daily., Disp: , Rfl:  .  acetaminophen (TYLENOL) 325 MG tablet, Take 650 mg by mouth 2 (two) times daily as needed., Disp: , Rfl:  .  docusate  sodium (COLACE) 50 MG capsule, Take 50 mg by mouth as needed. , Disp: , Rfl:  .  traMADol (ULTRAM) 50 MG tablet, Take 1 tablet (50 mg total) by mouth 2 (two) times daily., Disp: 10 tablet, Rfl: 0   Social History: Social History  Substance Use Topics  . Smoking status: Never Smoker  . Smokeless tobacco: Never Used  . Alcohol use No    Family Medical History: Family History  Problem Relation Age of Onset  . Heart attack Father   . Mitral valve prolapse Sister     Physical Examination: Vitals:   11/04/16 1530 11/04/16 1600  BP: (!) 165/66 (!) 161/59  Pulse: 72 96  Resp: (!) 24 20  Temp:    SpO2: 99% 99%      General: Patient is cachectic. She is not interactive.  She has bruising visible throughout her bilateral lower extremities.  Dorsal pedal pulses palpable.  She is breathing comfortably.  NEUROLOGICAL:  General: In no acute distress.   Does not open eyes, regard, or follow com.ands. She does not speak, repeat, or name.    P2-1 bilaterally. EOM appear intact Face symmetric. Does not protrude tongue.  She moves purposefully, but does not follow commands.  She localizes with bilateral upper extremities, and withdraws the BLE weakly.   Sensation unable to test. Unable to test all motor groups due to poor mental status.     Imaging: CT Head 11/04/16 IMPRESSION: 1. Multifocal intracranial hemorrhage, an overall traumatic pattern in this patient with history of multiple recent falls. 2. Hemorrhagic contusion in the left temporal lobe with 5 cc hematoma. Small volume subarachnoid, intraventricular, and subdural hemorrhage on left. This subdural hemorrhage measures up to 5 mm in thickness. Midline shift measures 4 mm. 3. Right scalp contusion and partial left mastoid/middle ear opacification without visible fracture. 4. Negative for cervical spine fracture.   Electronically Signed   By: Marnee SpringJonathon  Watts M.D.   On: 11/04/2016 13:18  I have personally reviewed the images and agree with the above interpretation.  Assessment and Plan: [name] is a pleasant 83 y.o. female with traumatic L temporal contusion in the setting of cognitive decline and dementia.  Current GCS [id number].  There is no role for intervention. I have recommended hospice care. We discussed this at length with her sisters, including showing the CT.  All questions were answered to the best of our ability.    Ivar DrapeAmanda Ferri, PA-C Venetia Nighthester Embry Manrique MD Dept. of Neurosurgery

## 2016-11-04 NOTE — Telephone Encounter (Signed)
Debra with [facility] left v/m that pt is presently at [facility] ED and they are requesting Hospice services for pt and if Dr Milinda Antisower is in agreement please fax back the agreement sent to Dr Milinda Antisower fax # [phone].

## 2016-11-04 NOTE — ED Notes (Signed)
Family to bedside at this time, given update via this RN.  MD notified of presence of family.

## 2016-11-04 NOTE — Telephone Encounter (Signed)
Please give the verbal ok - I will do the paperwork in the am

## 2016-11-04 NOTE — ED Notes (Signed)
Called EMS for transport to [facility]   [id number]

## 2016-11-04 NOTE — ED Notes (Signed)
Secretary notified to set up EMS transport back to [facility].

## 2016-11-04 NOTE — ED Provider Notes (Signed)
[facility] Emergency Department Provider Note  ____________________________________________   First MD Initiated Contact with Patient 11/04/16 1150     (approximate)  I have reviewed the triage vital signs and the nursing notes.   HISTORY  Chief Complaint Fall and Altered Mental Status   HPI [name] is a 83 y.o. female with a history of dementia as well as PVD on aspirin who is presenting to the emergency department with multiple falls and worsening mental status as of today. EMS reports that she has needed to the [facility] facility and at her baseline mental status was said to be unknown by the staff prior to arrival. However, it was reported that she was "worse" today. Patient is not responding to questions at this time and therefore history is limited. EMS reported a ecchymosis to the right side of the head.   Past Medical History:  Diagnosis Date  . Carotid stenosis   . Chronic constipation   . HLD (hyperlipidemia) 2002  . HTN (hypertension)   . Osteoarthritis   . Osteopenia    with arm fracture    Patient Active Problem List   Diagnosis Date Noted  . Dementia 11/02/2016  . PVD (peripheral vascular disease) (HCC) 10/19/2016  . Age-related cognitive decline 10/13/2016  . Pubic ramus fracture (HCC) 10/13/2016  . Orthostatic hypotension 07/22/2016  . Fatigue 07/20/2016  . Syncope 07/20/2016  . Elevated TSH 02/11/2015  . Mobility impaired 06/18/2014  . Dystrophic nail 07/11/2013  . Seborrheic keratoses 07/11/2013  . Callus of foot 01/17/2013  . Encounter for Medicare annual wellness exam 07/10/2012  . Hyperglycemia 01/21/2012  . Leg pain, bilateral 10/15/2011  . Ankle edema 10/15/2011  . B12 deficiency 08/22/2008  . Osteopenia 07/09/2008  . Carotid stenosis 11/14/2006  . Hyperlipidemia LDL goal <130 06/09/2006  . Essential hypertension 06/09/2006  . CONSTIPATION, CHRONIC 06/09/2006  . Osteoarthritis 06/09/2006    Past  Surgical History:  Procedure Laterality Date  . Abd [place]  12/07   Negative AAA  . carotid doppler  11/07   50-79 % RICA  . carotid doppler     50-69 % RICA, lessa then 50% LICA-overall no change  . COLONOSCOPY  8/02   Diverticulosis  . DEXA  08/2002   Osteopenia  . DEXA  4/10   Osteopenia (worse at forearm T-1.98)-imp at LS -1.0  . TOTAL HIP ARTHROPLASTY  6/06  . TOTAL HIP ARTHROPLASTY  10/06    Prior to Admission medications   Medication Sig Start Date End Date Taking? Authorizing Provider  Ascorbic Acid (VITAMIN C PO) Take 500 mg by mouth daily.    Yes [provider]  aspirin 81 MG tablet Take 81 mg by mouth daily.     Yes [provider]  Calcium Carb-Cholecalciferol (CALCIUM+D3) 600-800 MG-UNIT TABS Take 1 tablet by mouth daily.   Yes [provider]  lovastatin (MEVACOR) 20 MG tablet Take 1 tablet (20 mg total) by mouth daily. 03/01/16  Yes Tower, Audrie Gallus, MD  Multiple Vitamins-Minerals (PRESERVISION AREDS PO) Take 1 tablet by mouth 2 (two) times daily.    Yes [provider]  olmesartan (BENICAR) 40 MG tablet Take 1 tablet (40 mg total) by mouth daily. 03/01/16  Yes Tower, Audrie Gallus, MD  vitamin B-12 (CYANOCOBALAMIN) 1000 MCG tablet Take 1,000 mcg by mouth daily.   Yes [provider]  acetaminophen (TYLENOL) 325 MG tablet Take 650 mg by mouth 2 (two) times daily as needed.    [provider]  docusate sodium (COLACE) 50 MG capsule Take 50 mg by mouth as needed.     [provider]  traMADol (ULTRAM) 50 MG tablet Take 1 tablet (50 mg total) by mouth 2 (two) times daily. 10/03/16   Menshew, Charlesetta Ivory, PA-C    Allergies Alendronate sodium; Hctz [hydrochlorothiazide]; and Hydrocodone  Family History  Problem Relation Age of Onset  . Heart attack Father   . Mitral valve prolapse Sister     Social History Social History  Substance Use Topics  . Smoking status: Never Smoker  . Smokeless tobacco: Never Used  .  Alcohol use No    Review of Systems  Level V caveat secondary to altered mentation.   ____________________________________________   PHYSICAL EXAM:  VITAL SIGNS: ED Triage Vitals  Enc Vitals Group     BP 11/04/16 1141 (!) 148/54     Pulse Rate 11/04/16 1204 84     Resp 11/04/16 1141 (!) 22     Temp 11/04/16 1141 98.1 F (36.7 C)     Temp Source 11/04/16 1141 Axillary     SpO2 11/04/16 1141 98 %     Weight 11/04/16 1149 99 lb 3.3 oz (45 kg)     Height 11/04/16 1149 5\' 6"  (1.676 m)     Head Circumference --      Peak Flow --      Pain Score --      Pain Loc --      Pain Edu? --      Excl. in GC? --     Constitutional: The patient opens eyes when her name is called. Not following commands. Agitated and ripping off monitor leads. Eyes: Conjunctivae are normal. Pupils are 3 mm and PERRLA. Head: Large area of ecchymosis around the right temple. Nose: No congestion/rhinnorhea. Mouth/Throat: Mucous membranes are moist.  Neck: No stridor.  No obvious tenderness to the cervical spine. No deformity or step-off. Cardiovascular: Normal rate, regular rhythm. Grossly normal heart sounds.   Respiratory: Normal respiratory effort.  No retractions. Lungs CTAB. Gastrointestinal: Soft and nontender. No distention.  Musculoskeletal: No lower extremity tenderness nor edema.  No joint effusions. Neurologic:  Moves all 4 extremities equally. There is no facial droop. Skin:  Skin is warm, dry and intact. No rash noted. Psychiatric: Mood and affect are normal. Speech and behavior are normal.  ____________________________________________   LABS (all labs ordered are listed, but only abnormal results are displayed)  Labs Reviewed  COMPREHENSIVE METABOLIC PANEL - Abnormal; Notable for the following:       Result Value   Glucose, Bld 249 (*)    Total Protein 6.2 (*)    ALT 12 (*)    GFR calc non Af Amer 58 (*)    All other components within normal limits  CBC - Abnormal; Notable for the  following:    WBC 11.3 (*)    RBC 3.43 (*)    Hemoglobin 11.1 (*)    HCT 33.5 (*)    All other components within normal limits  URINALYSIS, COMPLETE (UACMP) WITH MICROSCOPIC - Abnormal; Notable for the following:    Color, Urine YELLOW (*)    APPearance CLEAR (*)    Glucose, UA >=500 (*)    Ketones, ur 20 (*)    Protein, ur 30 (*)    Squamous Epithelial / LPF 0-5 (*)    All other components within normal limits  TROPONIN I - Abnormal; Notable for the following:  Troponin I 0.62 (*)    All other components within normal limits  TSH  CBG MONITORING, ED   ____________________________________________  EKG  ED ECG REPORT I, Breezie Micucci,  Teena Iraniavid M, the attending physician, personally viewed and interpreted this ECG.   Date: 11/04/2016  EKG Time: 1144  Rate: 82  Rhythm: normal sinus rhythm with bigeminy intermittently.  Axis: Normal  Intervals:right bundle branch block  ST&T Change: Deep T-wave inversions in V2 through V4 which is consistent with the findings of cerebral hemorrhage.  ____________________________________________  RADIOLOGY  No acute findings on the pelvic x-ray ordered a chest x-ray.  CAT scans with multifocal intracranial hemorrhage with an overall traumatic pattern. Hemorrhagic contusion in the left temporal lobe with 5 cc of hematoma. Small volume subarachnoid, intraventricular and subdural hemorrhage on the left. Subdural hemorrhage measuring up to 5 mm in thickness with a 4 mm midline shift. Right scalp contusion is present. ____________________________________________   PROCEDURES  Procedure(s) performed:   Procedures  Critical Care performed:  CRITICAL CARE Performed by: Arelia LongestSchaevitz,  Geovanny Sartin M   Total critical care time: 35 minutes  Critical care time was exclusive of separately billable procedures and treating other patients.  Critical care was necessary to treat or prevent imminent or life-threatening deterioration.  Critical care was time  spent personally by me on the following activities: development of treatment plan with patient and/or surrogate as well as nursing, discussions with consultants, evaluation of patient's response to treatment, examination of patient, obtaining history from patient or surrogate, ordering and performing treatments and interventions, ordering and review of laboratory studies, ordering and review of radiographic studies, pulse oximetry and re-evaluation of patient's condition.   ____________________________________________   INITIAL IMPRESSION / ASSESSMENT AND PLAN / ED COURSE  Pertinent labs & imaging results that were available during my care of the patient were reviewed by me and considered in my medical decision making (see chart for details).  ----------------------------------------- 120 PM on 11/04/2016 -----------------------------------------  I discussed the case with the patient's sister who is also power of attorney.  We discussed the injury and the options for transfer versus keeping the patient at the hospital and likely comfort care measures. She says that she would like to discuss the case with her sisters and see the patient in the hospital personally. I told the patient's sister, [name], that this would delay the patient's care but she would rather discuss the case with family before making any decisions. I also discussed the case with Dr. Marcell BarlowYarborough of neurosurgery who says that he would be happy to talk to the family but would not be able to do so until about 4 PM. He says that his PA should be able to come over to discuss the case with family at about 2:15.    ----------------------------------------- 3:18 PM on 11/04/2016 -----------------------------------------  I discussed the case with the patient's 3 sisters are at the bedside. We discussed multiple different options such as hospice and more aggressive care with the transfer to a tertiary care center with a  neurosurgical ICU. The family would like to discuss the patient's condition with the neurosurgery service as well as hospice care prior to making a decision. They're aware of the delay in care.  Neurosurgery PA was in the emergency department but the patient's POA had stepped out. Still pending discussions with the neurosurgery PA as well as hospice. Side of the Dr. Mayford KnifeWilliams.  ____________________________________________   FINAL CLINICAL IMPRESSION(S) / ED DIAGNOSES  Final diagnoses:  Fall  Subdural hematoma. Cerebral contusion. Subarachnoid hemorrhage.   NEW MEDICATIONS STARTED DURING THIS VISIT:  New Prescriptions   No medications on file     Note:  This document was prepared using Dragon voice recognition software and may include unintentional dictation errors.     Myrna Blazer, MD 11/04/16 1520

## 2016-11-04 NOTE — ED Triage Notes (Signed)
[facility] in [facility]; per EMS pt with multiple falls over last few days, facility reports decreased LOC.  Baseline unknown due to pt recently being moved from Assisted Living to Memory Care.  Pt nonverbal upon arrival.  Unable to assess orientation.  Vitals WDL.

## 2016-11-04 NOTE — ED Notes (Signed)
Pharmacy notified to send Keppra dose. 

## 2016-11-04 NOTE — ED Notes (Signed)
Hospice RN to bedside at this time.

## 2016-11-04 NOTE — Progress Notes (Signed)
New referral for [facility] services at [facility] received from [facility] Cheryl Wilder. Patient is a 83 year old woman brought to the [facility] ED  for evaluation of worsening mental status. Head CT revealed an ICH with a midline shift. Neurosurgery was consulted and no intervention is available based on her age and co morbidities. °Writer met in the room with patient sister's Sara, Betty and Vesta along with brother in law Bob. Sara and Betty are [place] HCPOA. Hospice services were explained in detail. Questions answered. All were in agreement with patient returning to [facility] with hospice services. Writer discussed patient's wishes for resuscitation vs DNR. All sisters agreed on DNR. This was then discussed with ED attending physician Dr.Williams. Patient to return via EMS to [facility] with signed DNR in place. Patient remained with eyes closed and did not respond or follow commands when spoken to. Hospice information and contact number left with Betty. Family contact numbers confirmed.Patient information faxed to referral. Much Emotional support was provided. Thank you. °Karen Robertson RN, BSN, CHPN °[facility], hospital Liaison °[phone] c °

## 2016-11-04 NOTE — ED Notes (Signed)
[facility]   [id number]

## 2016-11-05 ENCOUNTER — Telehealth: Payer: Self-pay | Admitting: *Deleted

## 2016-11-05 ENCOUNTER — Telehealth: Payer: Self-pay

## 2016-11-05 DIAGNOSIS — K5909 Other constipation: Secondary | ICD-10-CM | POA: Diagnosis not present

## 2016-11-05 DIAGNOSIS — M1991 Primary osteoarthritis, unspecified site: Secondary | ICD-10-CM | POA: Diagnosis not present

## 2016-11-05 DIAGNOSIS — I10 Essential (primary) hypertension: Secondary | ICD-10-CM | POA: Diagnosis not present

## 2016-11-05 DIAGNOSIS — L89210 Pressure ulcer of right hip, unstageable: Secondary | ICD-10-CM | POA: Diagnosis not present

## 2016-11-05 DIAGNOSIS — S065X0D Traumatic subdural hemorrhage without loss of consciousness, subsequent encounter: Secondary | ICD-10-CM | POA: Diagnosis not present

## 2016-11-05 DIAGNOSIS — I6529 Occlusion and stenosis of unspecified carotid artery: Secondary | ICD-10-CM | POA: Diagnosis not present

## 2016-11-05 DIAGNOSIS — S81811D Laceration without foreign body, right lower leg, subsequent encounter: Secondary | ICD-10-CM | POA: Diagnosis not present

## 2016-11-05 DIAGNOSIS — E785 Hyperlipidemia, unspecified: Secondary | ICD-10-CM | POA: Diagnosis not present

## 2016-11-05 DIAGNOSIS — F0390 Unspecified dementia without behavioral disturbance: Secondary | ICD-10-CM | POA: Diagnosis not present

## 2016-11-05 DIAGNOSIS — S41011D Laceration without foreign body of right shoulder, subsequent encounter: Secondary | ICD-10-CM | POA: Diagnosis not present

## 2016-11-05 DIAGNOSIS — I739 Peripheral vascular disease, unspecified: Secondary | ICD-10-CM | POA: Diagnosis not present

## 2016-11-05 MED ORDER — MORPHINE SULFATE 20 MG/5ML PO SOLN: ORAL | 0 refills | Status: AC

## 2016-11-05 MED ORDER — LORAZEPAM 0.5 MG PO TABS: 0.5000 mg | ORAL_TABLET | ORAL | 1 refills | Status: DC | PRN

## 2016-11-05 NOTE — Telephone Encounter (Signed)
I printed those to fax  Be aware- she has had syncope with hydrocodone in the past

## 2016-11-05 NOTE — Telephone Encounter (Signed)
Stanton KidneyDebra said she fax form to the main fax # yesterday, I advise her that Dr. Milinda Antisower didn't receive it yet and request that she refax it to my fax #. Form received and placed on Dr. Royden Purlower's desk

## 2016-11-05 NOTE — Telephone Encounter (Signed)
[name] with [facility] said pt was at ED on 11/04/16 and was dx with brain bleed and sent back to facility; pt has no comfort meds; pt is non responsive. [name] request morphine and lorazepam [facility]. [name] does not know the fax #. [name] request to be done today.

## 2016-11-05 NOTE — Telephone Encounter (Signed)
Tammy called back and said if can fax morphine and lorazepam rx to [facility] [address] a Hospice nurse will pick up and take to pt. Please call [phone] and notify hospice nurse that rxs have been faxed. Also needs order to d/c all PO meds except the comfort meds.

## 2016-11-05 NOTE — Telephone Encounter (Signed)
-----   Message from Judy PimpleMarne A Tower, MD sent at 11/04/2016  7:37 PM EDT ----- Pt was in the ED but I do not think they saw/addressed this  Her UC is pos Please send in keflex 250 mg 1 po bid #14 no ref if family wants to treat  It may help her mental status -but I do know she is under hospice care

## 2016-11-05 NOTE — Telephone Encounter (Signed)
Dr. Milinda Antisower signed it and I faxed it back to Hospice and sent form for scanning

## 2016-11-05 NOTE — Telephone Encounter (Signed)
Spoke with pt's sister and advise of urine cx results and she does want pt treated. Called [facility] and advise Tiffany, RN of pt's urine cx results and send order for Rx.

## 2016-11-05 NOTE — Telephone Encounter (Signed)
Orders faxed back to Hospice, and Rx faxed to pharmacy, called Hospice and let operator know that Rxs were sent and he will let the on call nurse know

## 2016-11-06 DIAGNOSIS — I739 Peripheral vascular disease, unspecified: Secondary | ICD-10-CM | POA: Diagnosis not present

## 2016-11-06 DIAGNOSIS — L89210 Pressure ulcer of right hip, unstageable: Secondary | ICD-10-CM | POA: Diagnosis not present

## 2016-11-06 DIAGNOSIS — I10 Essential (primary) hypertension: Secondary | ICD-10-CM | POA: Diagnosis not present

## 2016-11-06 DIAGNOSIS — F0390 Unspecified dementia without behavioral disturbance: Secondary | ICD-10-CM | POA: Diagnosis not present

## 2016-11-06 DIAGNOSIS — S065X0D Traumatic subdural hemorrhage without loss of consciousness, subsequent encounter: Secondary | ICD-10-CM | POA: Diagnosis not present

## 2016-11-06 DIAGNOSIS — I6529 Occlusion and stenosis of unspecified carotid artery: Secondary | ICD-10-CM | POA: Diagnosis not present

## 2016-11-07 DIAGNOSIS — L89210 Pressure ulcer of right hip, unstageable: Secondary | ICD-10-CM | POA: Diagnosis not present

## 2016-11-07 DIAGNOSIS — F0390 Unspecified dementia without behavioral disturbance: Secondary | ICD-10-CM | POA: Diagnosis not present

## 2016-11-07 DIAGNOSIS — S065X0D Traumatic subdural hemorrhage without loss of consciousness, subsequent encounter: Secondary | ICD-10-CM | POA: Diagnosis not present

## 2016-11-07 DIAGNOSIS — I739 Peripheral vascular disease, unspecified: Secondary | ICD-10-CM | POA: Diagnosis not present

## 2016-11-07 DIAGNOSIS — I10 Essential (primary) hypertension: Secondary | ICD-10-CM | POA: Diagnosis not present

## 2016-11-07 DIAGNOSIS — I6529 Occlusion and stenosis of unspecified carotid artery: Secondary | ICD-10-CM | POA: Diagnosis not present

## 2016-11-08 ENCOUNTER — Telehealth: Payer: Self-pay | Admitting: Family Medicine

## 2016-11-08 DIAGNOSIS — I10 Essential (primary) hypertension: Secondary | ICD-10-CM | POA: Diagnosis not present

## 2016-11-08 DIAGNOSIS — I6529 Occlusion and stenosis of unspecified carotid artery: Secondary | ICD-10-CM | POA: Diagnosis not present

## 2016-11-08 DIAGNOSIS — F0390 Unspecified dementia without behavioral disturbance: Secondary | ICD-10-CM | POA: Diagnosis not present

## 2016-11-08 DIAGNOSIS — S065X0D Traumatic subdural hemorrhage without loss of consciousness, subsequent encounter: Secondary | ICD-10-CM | POA: Diagnosis not present

## 2016-11-08 DIAGNOSIS — L89210 Pressure ulcer of right hip, unstageable: Secondary | ICD-10-CM | POA: Diagnosis not present

## 2016-11-08 DIAGNOSIS — I739 Peripheral vascular disease, unspecified: Secondary | ICD-10-CM | POA: Diagnosis not present

## 2016-11-08 NOTE — Telephone Encounter (Signed)
Client [facility] - Client Client Site [facility] - Night Physician Roxy Mannsower, Marne - MD Contact Type Call Who Is Calling Physician / Provider / Hospital Call Type Provider Call Message Only Reason for Call Request to send message to Office Initial Comment Caller States the patient had a fall Sunday morning, no injury, patient is Kathleen CornfieldBeatrice Cullers DOB 12/03/1920 CB# [phone] Additional Comment Call Closed By: Bradley FerrisJulia Paris Transaction Date/Time: 11/08/2016 6:40:35 AM (ET)

## 2016-11-08 NOTE — Telephone Encounter (Signed)
Aware, thank you.

## 2016-11-09 DIAGNOSIS — I6529 Occlusion and stenosis of unspecified carotid artery: Secondary | ICD-10-CM | POA: Diagnosis not present

## 2016-11-09 DIAGNOSIS — S065X0D Traumatic subdural hemorrhage without loss of consciousness, subsequent encounter: Secondary | ICD-10-CM | POA: Diagnosis not present

## 2016-11-09 DIAGNOSIS — F0390 Unspecified dementia without behavioral disturbance: Secondary | ICD-10-CM | POA: Diagnosis not present

## 2016-11-09 DIAGNOSIS — I10 Essential (primary) hypertension: Secondary | ICD-10-CM | POA: Diagnosis not present

## 2016-11-09 DIAGNOSIS — I739 Peripheral vascular disease, unspecified: Secondary | ICD-10-CM | POA: Diagnosis not present

## 2016-11-09 DIAGNOSIS — L89210 Pressure ulcer of right hip, unstageable: Secondary | ICD-10-CM | POA: Diagnosis not present

## 2016-11-10 ENCOUNTER — Telehealth: Payer: Self-pay

## 2016-11-10 DIAGNOSIS — F0390 Unspecified dementia without behavioral disturbance: Secondary | ICD-10-CM | POA: Diagnosis not present

## 2016-11-10 DIAGNOSIS — S065X0D Traumatic subdural hemorrhage without loss of consciousness, subsequent encounter: Secondary | ICD-10-CM | POA: Diagnosis not present

## 2016-11-10 DIAGNOSIS — I10 Essential (primary) hypertension: Secondary | ICD-10-CM | POA: Diagnosis not present

## 2016-11-10 DIAGNOSIS — I6529 Occlusion and stenosis of unspecified carotid artery: Secondary | ICD-10-CM | POA: Diagnosis not present

## 2016-11-10 DIAGNOSIS — L89210 Pressure ulcer of right hip, unstageable: Secondary | ICD-10-CM | POA: Diagnosis not present

## 2016-11-10 DIAGNOSIS — I739 Peripheral vascular disease, unspecified: Secondary | ICD-10-CM | POA: Diagnosis not present

## 2016-11-10 MED ORDER — LORAZEPAM 0.5 MG PO TABS: 0.5000 mg | ORAL_TABLET | ORAL | 0 refills | Status: AC | PRN

## 2016-11-10 NOTE — Telephone Encounter (Signed)
Let's start with one and they can let me know in the future if they want to increase it  Printed for fax

## 2016-11-10 NOTE — Telephone Encounter (Signed)
[name] said that needs new order for Lorazepam 0.5 mg faxed to [phone]. Kathleen CrumbleOksana said facility is not skilled and cannot decide whether pt needs one or two tabs of lorazepam; request new order faxed for Lorazepam 0.5 mg specifying either one or two tabs q 4 h prn for anxiety or seizure. Does not need any more medication sent to pharmacy but needs order for the facility only.

## 2016-11-10 NOTE — Telephone Encounter (Signed)
Rx faxed to nursing home and left message for Juanda CrumbleOksana letting her know Dr. Royden Purlower's comments

## 2016-11-11 DIAGNOSIS — I10 Essential (primary) hypertension: Secondary | ICD-10-CM | POA: Diagnosis not present

## 2016-11-11 DIAGNOSIS — F0390 Unspecified dementia without behavioral disturbance: Secondary | ICD-10-CM | POA: Diagnosis not present

## 2016-11-11 DIAGNOSIS — I6529 Occlusion and stenosis of unspecified carotid artery: Secondary | ICD-10-CM | POA: Diagnosis not present

## 2016-11-11 DIAGNOSIS — L89210 Pressure ulcer of right hip, unstageable: Secondary | ICD-10-CM | POA: Diagnosis not present

## 2016-11-11 DIAGNOSIS — I739 Peripheral vascular disease, unspecified: Secondary | ICD-10-CM | POA: Diagnosis not present

## 2016-11-11 DIAGNOSIS — S065X0D Traumatic subdural hemorrhage without loss of consciousness, subsequent encounter: Secondary | ICD-10-CM | POA: Diagnosis not present

## 2016-11-12 DIAGNOSIS — I10 Essential (primary) hypertension: Secondary | ICD-10-CM | POA: Diagnosis not present

## 2016-11-12 DIAGNOSIS — L89210 Pressure ulcer of right hip, unstageable: Secondary | ICD-10-CM | POA: Diagnosis not present

## 2016-11-12 DIAGNOSIS — F0390 Unspecified dementia without behavioral disturbance: Secondary | ICD-10-CM | POA: Diagnosis not present

## 2016-11-12 DIAGNOSIS — I739 Peripheral vascular disease, unspecified: Secondary | ICD-10-CM | POA: Diagnosis not present

## 2016-11-12 DIAGNOSIS — I6529 Occlusion and stenosis of unspecified carotid artery: Secondary | ICD-10-CM | POA: Diagnosis not present

## 2016-11-12 DIAGNOSIS — S065X0D Traumatic subdural hemorrhage without loss of consciousness, subsequent encounter: Secondary | ICD-10-CM | POA: Diagnosis not present

## 2016-11-15 ENCOUNTER — Telehealth: Payer: Self-pay | Admitting: Family Medicine

## 2016-11-15 DIAGNOSIS — I739 Peripheral vascular disease, unspecified: Secondary | ICD-10-CM | POA: Diagnosis not present

## 2016-11-15 DIAGNOSIS — I6529 Occlusion and stenosis of unspecified carotid artery: Secondary | ICD-10-CM | POA: Diagnosis not present

## 2016-11-15 DIAGNOSIS — L89210 Pressure ulcer of right hip, unstageable: Secondary | ICD-10-CM | POA: Diagnosis not present

## 2016-11-15 DIAGNOSIS — S065X0D Traumatic subdural hemorrhage without loss of consciousness, subsequent encounter: Secondary | ICD-10-CM | POA: Diagnosis not present

## 2016-11-15 DIAGNOSIS — F0390 Unspecified dementia without behavioral disturbance: Secondary | ICD-10-CM | POA: Diagnosis not present

## 2016-11-15 DIAGNOSIS — I10 Essential (primary) hypertension: Secondary | ICD-10-CM | POA: Diagnosis not present

## 2016-11-15 MED ORDER — TRAMADOL HCL 50 MG PO TABS: 50.0000 mg | ORAL_TABLET | Freq: Two times a day (BID) | ORAL | 5 refills | Status: DC

## 2016-11-15 NOTE — Telephone Encounter (Signed)
Note from [facility] req px faxed for tramadol  In IN box

## 2016-11-16 DIAGNOSIS — F0390 Unspecified dementia without behavioral disturbance: Secondary | ICD-10-CM | POA: Diagnosis not present

## 2016-11-16 DIAGNOSIS — S065X0D Traumatic subdural hemorrhage without loss of consciousness, subsequent encounter: Secondary | ICD-10-CM | POA: Diagnosis not present

## 2016-11-16 DIAGNOSIS — L89210 Pressure ulcer of right hip, unstageable: Secondary | ICD-10-CM | POA: Diagnosis not present

## 2016-11-16 DIAGNOSIS — I10 Essential (primary) hypertension: Secondary | ICD-10-CM | POA: Diagnosis not present

## 2016-11-16 DIAGNOSIS — I739 Peripheral vascular disease, unspecified: Secondary | ICD-10-CM | POA: Diagnosis not present

## 2016-11-16 DIAGNOSIS — I6529 Occlusion and stenosis of unspecified carotid artery: Secondary | ICD-10-CM | POA: Diagnosis not present

## 2016-11-17 DIAGNOSIS — I739 Peripheral vascular disease, unspecified: Secondary | ICD-10-CM | POA: Diagnosis not present

## 2016-11-17 DIAGNOSIS — L89210 Pressure ulcer of right hip, unstageable: Secondary | ICD-10-CM | POA: Diagnosis not present

## 2016-11-17 DIAGNOSIS — F0390 Unspecified dementia without behavioral disturbance: Secondary | ICD-10-CM | POA: Diagnosis not present

## 2016-11-17 DIAGNOSIS — I10 Essential (primary) hypertension: Secondary | ICD-10-CM | POA: Diagnosis not present

## 2016-11-17 DIAGNOSIS — I6529 Occlusion and stenosis of unspecified carotid artery: Secondary | ICD-10-CM | POA: Diagnosis not present

## 2016-11-17 DIAGNOSIS — S065X0D Traumatic subdural hemorrhage without loss of consciousness, subsequent encounter: Secondary | ICD-10-CM | POA: Diagnosis not present

## 2016-11-18 DIAGNOSIS — F0390 Unspecified dementia without behavioral disturbance: Secondary | ICD-10-CM | POA: Diagnosis not present

## 2016-11-18 DIAGNOSIS — I6529 Occlusion and stenosis of unspecified carotid artery: Secondary | ICD-10-CM | POA: Diagnosis not present

## 2016-11-18 DIAGNOSIS — L89210 Pressure ulcer of right hip, unstageable: Secondary | ICD-10-CM | POA: Diagnosis not present

## 2016-11-18 DIAGNOSIS — I739 Peripheral vascular disease, unspecified: Secondary | ICD-10-CM | POA: Diagnosis not present

## 2016-11-18 DIAGNOSIS — S065X0D Traumatic subdural hemorrhage without loss of consciousness, subsequent encounter: Secondary | ICD-10-CM | POA: Diagnosis not present

## 2016-11-18 DIAGNOSIS — I10 Essential (primary) hypertension: Secondary | ICD-10-CM | POA: Diagnosis not present

## 2016-11-19 DIAGNOSIS — L89210 Pressure ulcer of right hip, unstageable: Secondary | ICD-10-CM | POA: Diagnosis not present

## 2016-11-19 DIAGNOSIS — I10 Essential (primary) hypertension: Secondary | ICD-10-CM | POA: Diagnosis not present

## 2016-11-19 DIAGNOSIS — S065X0D Traumatic subdural hemorrhage without loss of consciousness, subsequent encounter: Secondary | ICD-10-CM | POA: Diagnosis not present

## 2016-11-19 DIAGNOSIS — F0390 Unspecified dementia without behavioral disturbance: Secondary | ICD-10-CM | POA: Diagnosis not present

## 2016-11-19 DIAGNOSIS — I739 Peripheral vascular disease, unspecified: Secondary | ICD-10-CM | POA: Diagnosis not present

## 2016-11-19 DIAGNOSIS — I6529 Occlusion and stenosis of unspecified carotid artery: Secondary | ICD-10-CM | POA: Diagnosis not present

## 2016-11-22 DIAGNOSIS — I10 Essential (primary) hypertension: Secondary | ICD-10-CM | POA: Diagnosis not present

## 2016-11-22 DIAGNOSIS — F0390 Unspecified dementia without behavioral disturbance: Secondary | ICD-10-CM | POA: Diagnosis not present

## 2016-11-22 DIAGNOSIS — L89210 Pressure ulcer of right hip, unstageable: Secondary | ICD-10-CM | POA: Diagnosis not present

## 2016-11-22 DIAGNOSIS — S065X0D Traumatic subdural hemorrhage without loss of consciousness, subsequent encounter: Secondary | ICD-10-CM | POA: Diagnosis not present

## 2016-11-22 DIAGNOSIS — I6529 Occlusion and stenosis of unspecified carotid artery: Secondary | ICD-10-CM | POA: Diagnosis not present

## 2016-11-22 DIAGNOSIS — I739 Peripheral vascular disease, unspecified: Secondary | ICD-10-CM | POA: Diagnosis not present

## 2016-11-23 DIAGNOSIS — S065X0D Traumatic subdural hemorrhage without loss of consciousness, subsequent encounter: Secondary | ICD-10-CM | POA: Diagnosis not present

## 2016-11-23 DIAGNOSIS — I10 Essential (primary) hypertension: Secondary | ICD-10-CM | POA: Diagnosis not present

## 2016-11-23 DIAGNOSIS — I6529 Occlusion and stenosis of unspecified carotid artery: Secondary | ICD-10-CM | POA: Diagnosis not present

## 2016-11-23 DIAGNOSIS — L89210 Pressure ulcer of right hip, unstageable: Secondary | ICD-10-CM | POA: Diagnosis not present

## 2016-11-23 DIAGNOSIS — I739 Peripheral vascular disease, unspecified: Secondary | ICD-10-CM | POA: Diagnosis not present

## 2016-11-23 DIAGNOSIS — F0390 Unspecified dementia without behavioral disturbance: Secondary | ICD-10-CM | POA: Diagnosis not present

## 2016-11-24 DIAGNOSIS — L89210 Pressure ulcer of right hip, unstageable: Secondary | ICD-10-CM | POA: Diagnosis not present

## 2016-11-24 DIAGNOSIS — S065X0D Traumatic subdural hemorrhage without loss of consciousness, subsequent encounter: Secondary | ICD-10-CM | POA: Diagnosis not present

## 2016-11-24 DIAGNOSIS — I6529 Occlusion and stenosis of unspecified carotid artery: Secondary | ICD-10-CM | POA: Diagnosis not present

## 2016-11-24 DIAGNOSIS — I10 Essential (primary) hypertension: Secondary | ICD-10-CM | POA: Diagnosis not present

## 2016-11-24 DIAGNOSIS — I739 Peripheral vascular disease, unspecified: Secondary | ICD-10-CM | POA: Diagnosis not present

## 2016-11-24 DIAGNOSIS — F0390 Unspecified dementia without behavioral disturbance: Secondary | ICD-10-CM | POA: Diagnosis not present

## 2016-11-25 DIAGNOSIS — L89210 Pressure ulcer of right hip, unstageable: Secondary | ICD-10-CM | POA: Diagnosis not present

## 2016-11-25 DIAGNOSIS — I739 Peripheral vascular disease, unspecified: Secondary | ICD-10-CM | POA: Diagnosis not present

## 2016-11-25 DIAGNOSIS — F0390 Unspecified dementia without behavioral disturbance: Secondary | ICD-10-CM | POA: Diagnosis not present

## 2016-11-25 DIAGNOSIS — I6529 Occlusion and stenosis of unspecified carotid artery: Secondary | ICD-10-CM | POA: Diagnosis not present

## 2016-11-25 DIAGNOSIS — I10 Essential (primary) hypertension: Secondary | ICD-10-CM | POA: Diagnosis not present

## 2016-11-25 DIAGNOSIS — S065X0D Traumatic subdural hemorrhage without loss of consciousness, subsequent encounter: Secondary | ICD-10-CM | POA: Diagnosis not present

## 2016-11-26 DIAGNOSIS — L89210 Pressure ulcer of right hip, unstageable: Secondary | ICD-10-CM | POA: Diagnosis not present

## 2016-11-26 DIAGNOSIS — I739 Peripheral vascular disease, unspecified: Secondary | ICD-10-CM | POA: Diagnosis not present

## 2016-11-26 DIAGNOSIS — I10 Essential (primary) hypertension: Secondary | ICD-10-CM | POA: Diagnosis not present

## 2016-11-26 DIAGNOSIS — F0390 Unspecified dementia without behavioral disturbance: Secondary | ICD-10-CM | POA: Diagnosis not present

## 2016-11-26 DIAGNOSIS — S065X0D Traumatic subdural hemorrhage without loss of consciousness, subsequent encounter: Secondary | ICD-10-CM | POA: Diagnosis not present

## 2016-11-26 DIAGNOSIS — I6529 Occlusion and stenosis of unspecified carotid artery: Secondary | ICD-10-CM | POA: Diagnosis not present

## 2016-11-27 DIAGNOSIS — L89210 Pressure ulcer of right hip, unstageable: Secondary | ICD-10-CM | POA: Diagnosis not present

## 2016-11-27 DIAGNOSIS — K5909 Other constipation: Secondary | ICD-10-CM | POA: Diagnosis not present

## 2016-11-27 DIAGNOSIS — F0390 Unspecified dementia without behavioral disturbance: Secondary | ICD-10-CM | POA: Diagnosis not present

## 2016-11-27 DIAGNOSIS — S41011D Laceration without foreign body of right shoulder, subsequent encounter: Secondary | ICD-10-CM | POA: Diagnosis not present

## 2016-11-27 DIAGNOSIS — E785 Hyperlipidemia, unspecified: Secondary | ICD-10-CM | POA: Diagnosis not present

## 2016-11-27 DIAGNOSIS — I739 Peripheral vascular disease, unspecified: Secondary | ICD-10-CM | POA: Diagnosis not present

## 2016-11-27 DIAGNOSIS — I6529 Occlusion and stenosis of unspecified carotid artery: Secondary | ICD-10-CM | POA: Diagnosis not present

## 2016-11-27 DIAGNOSIS — M1991 Primary osteoarthritis, unspecified site: Secondary | ICD-10-CM | POA: Diagnosis not present

## 2016-11-27 DIAGNOSIS — S81811D Laceration without foreign body, right lower leg, subsequent encounter: Secondary | ICD-10-CM | POA: Diagnosis not present

## 2016-11-27 DIAGNOSIS — S065X0D Traumatic subdural hemorrhage without loss of consciousness, subsequent encounter: Secondary | ICD-10-CM | POA: Diagnosis not present

## 2016-11-27 DIAGNOSIS — I10 Essential (primary) hypertension: Secondary | ICD-10-CM | POA: Diagnosis not present

## 2016-11-29 DIAGNOSIS — S065X0D Traumatic subdural hemorrhage without loss of consciousness, subsequent encounter: Secondary | ICD-10-CM | POA: Diagnosis not present

## 2016-11-29 DIAGNOSIS — F0390 Unspecified dementia without behavioral disturbance: Secondary | ICD-10-CM | POA: Diagnosis not present

## 2016-11-29 DIAGNOSIS — I739 Peripheral vascular disease, unspecified: Secondary | ICD-10-CM | POA: Diagnosis not present

## 2016-11-29 DIAGNOSIS — I10 Essential (primary) hypertension: Secondary | ICD-10-CM | POA: Diagnosis not present

## 2016-11-29 DIAGNOSIS — L89210 Pressure ulcer of right hip, unstageable: Secondary | ICD-10-CM | POA: Diagnosis not present

## 2016-11-29 DIAGNOSIS — I6529 Occlusion and stenosis of unspecified carotid artery: Secondary | ICD-10-CM | POA: Diagnosis not present

## 2016-11-30 DIAGNOSIS — I10 Essential (primary) hypertension: Secondary | ICD-10-CM | POA: Diagnosis not present

## 2016-11-30 DIAGNOSIS — I739 Peripheral vascular disease, unspecified: Secondary | ICD-10-CM | POA: Diagnosis not present

## 2016-11-30 DIAGNOSIS — S065X0D Traumatic subdural hemorrhage without loss of consciousness, subsequent encounter: Secondary | ICD-10-CM | POA: Diagnosis not present

## 2016-11-30 DIAGNOSIS — F0390 Unspecified dementia without behavioral disturbance: Secondary | ICD-10-CM | POA: Diagnosis not present

## 2016-11-30 DIAGNOSIS — I6529 Occlusion and stenosis of unspecified carotid artery: Secondary | ICD-10-CM | POA: Diagnosis not present

## 2016-11-30 DIAGNOSIS — L89210 Pressure ulcer of right hip, unstageable: Secondary | ICD-10-CM | POA: Diagnosis not present

## 2016-12-01 DIAGNOSIS — I739 Peripheral vascular disease, unspecified: Secondary | ICD-10-CM | POA: Diagnosis not present

## 2016-12-01 DIAGNOSIS — I10 Essential (primary) hypertension: Secondary | ICD-10-CM | POA: Diagnosis not present

## 2016-12-01 DIAGNOSIS — L89210 Pressure ulcer of right hip, unstageable: Secondary | ICD-10-CM | POA: Diagnosis not present

## 2016-12-01 DIAGNOSIS — I6529 Occlusion and stenosis of unspecified carotid artery: Secondary | ICD-10-CM | POA: Diagnosis not present

## 2016-12-01 DIAGNOSIS — F0390 Unspecified dementia without behavioral disturbance: Secondary | ICD-10-CM | POA: Diagnosis not present

## 2016-12-01 DIAGNOSIS — S065X0D Traumatic subdural hemorrhage without loss of consciousness, subsequent encounter: Secondary | ICD-10-CM | POA: Diagnosis not present

## 2016-12-02 DIAGNOSIS — L89210 Pressure ulcer of right hip, unstageable: Secondary | ICD-10-CM | POA: Diagnosis not present

## 2016-12-02 DIAGNOSIS — S065X0D Traumatic subdural hemorrhage without loss of consciousness, subsequent encounter: Secondary | ICD-10-CM | POA: Diagnosis not present

## 2016-12-02 DIAGNOSIS — F0390 Unspecified dementia without behavioral disturbance: Secondary | ICD-10-CM | POA: Diagnosis not present

## 2016-12-02 DIAGNOSIS — I739 Peripheral vascular disease, unspecified: Secondary | ICD-10-CM | POA: Diagnosis not present

## 2016-12-02 DIAGNOSIS — I10 Essential (primary) hypertension: Secondary | ICD-10-CM | POA: Diagnosis not present

## 2016-12-02 DIAGNOSIS — I6529 Occlusion and stenosis of unspecified carotid artery: Secondary | ICD-10-CM | POA: Diagnosis not present

## 2016-12-03 DIAGNOSIS — I10 Essential (primary) hypertension: Secondary | ICD-10-CM | POA: Diagnosis not present

## 2016-12-03 DIAGNOSIS — S065X0D Traumatic subdural hemorrhage without loss of consciousness, subsequent encounter: Secondary | ICD-10-CM | POA: Diagnosis not present

## 2016-12-03 DIAGNOSIS — I6529 Occlusion and stenosis of unspecified carotid artery: Secondary | ICD-10-CM | POA: Diagnosis not present

## 2016-12-03 DIAGNOSIS — I739 Peripheral vascular disease, unspecified: Secondary | ICD-10-CM | POA: Diagnosis not present

## 2016-12-03 DIAGNOSIS — L89210 Pressure ulcer of right hip, unstageable: Secondary | ICD-10-CM | POA: Diagnosis not present

## 2016-12-03 DIAGNOSIS — F0390 Unspecified dementia without behavioral disturbance: Secondary | ICD-10-CM | POA: Diagnosis not present

## 2016-12-06 DIAGNOSIS — I739 Peripheral vascular disease, unspecified: Secondary | ICD-10-CM | POA: Diagnosis not present

## 2016-12-06 DIAGNOSIS — L89210 Pressure ulcer of right hip, unstageable: Secondary | ICD-10-CM | POA: Diagnosis not present

## 2016-12-06 DIAGNOSIS — S065X0D Traumatic subdural hemorrhage without loss of consciousness, subsequent encounter: Secondary | ICD-10-CM | POA: Diagnosis not present

## 2016-12-06 DIAGNOSIS — I10 Essential (primary) hypertension: Secondary | ICD-10-CM | POA: Diagnosis not present

## 2016-12-06 DIAGNOSIS — F0390 Unspecified dementia without behavioral disturbance: Secondary | ICD-10-CM | POA: Diagnosis not present

## 2016-12-06 DIAGNOSIS — I6529 Occlusion and stenosis of unspecified carotid artery: Secondary | ICD-10-CM | POA: Diagnosis not present

## 2016-12-07 DIAGNOSIS — S065X0D Traumatic subdural hemorrhage without loss of consciousness, subsequent encounter: Secondary | ICD-10-CM | POA: Diagnosis not present

## 2016-12-07 DIAGNOSIS — I6529 Occlusion and stenosis of unspecified carotid artery: Secondary | ICD-10-CM | POA: Diagnosis not present

## 2016-12-07 DIAGNOSIS — F0390 Unspecified dementia without behavioral disturbance: Secondary | ICD-10-CM | POA: Diagnosis not present

## 2016-12-07 DIAGNOSIS — I739 Peripheral vascular disease, unspecified: Secondary | ICD-10-CM | POA: Diagnosis not present

## 2016-12-07 DIAGNOSIS — I10 Essential (primary) hypertension: Secondary | ICD-10-CM | POA: Diagnosis not present

## 2016-12-07 DIAGNOSIS — L89210 Pressure ulcer of right hip, unstageable: Secondary | ICD-10-CM | POA: Diagnosis not present

## 2016-12-08 DIAGNOSIS — I739 Peripheral vascular disease, unspecified: Secondary | ICD-10-CM | POA: Diagnosis not present

## 2016-12-08 DIAGNOSIS — S065X0D Traumatic subdural hemorrhage without loss of consciousness, subsequent encounter: Secondary | ICD-10-CM | POA: Diagnosis not present

## 2016-12-08 DIAGNOSIS — F0390 Unspecified dementia without behavioral disturbance: Secondary | ICD-10-CM | POA: Diagnosis not present

## 2016-12-08 DIAGNOSIS — I6529 Occlusion and stenosis of unspecified carotid artery: Secondary | ICD-10-CM | POA: Diagnosis not present

## 2016-12-08 DIAGNOSIS — I10 Essential (primary) hypertension: Secondary | ICD-10-CM | POA: Diagnosis not present

## 2016-12-08 DIAGNOSIS — L89210 Pressure ulcer of right hip, unstageable: Secondary | ICD-10-CM | POA: Diagnosis not present

## 2016-12-09 DIAGNOSIS — F0390 Unspecified dementia without behavioral disturbance: Secondary | ICD-10-CM | POA: Diagnosis not present

## 2016-12-09 DIAGNOSIS — I6529 Occlusion and stenosis of unspecified carotid artery: Secondary | ICD-10-CM | POA: Diagnosis not present

## 2016-12-09 DIAGNOSIS — I739 Peripheral vascular disease, unspecified: Secondary | ICD-10-CM | POA: Diagnosis not present

## 2016-12-09 DIAGNOSIS — L89210 Pressure ulcer of right hip, unstageable: Secondary | ICD-10-CM | POA: Diagnosis not present

## 2016-12-09 DIAGNOSIS — I10 Essential (primary) hypertension: Secondary | ICD-10-CM | POA: Diagnosis not present

## 2016-12-09 DIAGNOSIS — S065X0D Traumatic subdural hemorrhage without loss of consciousness, subsequent encounter: Secondary | ICD-10-CM | POA: Diagnosis not present

## 2016-12-10 DIAGNOSIS — I6529 Occlusion and stenosis of unspecified carotid artery: Secondary | ICD-10-CM | POA: Diagnosis not present

## 2016-12-10 DIAGNOSIS — I739 Peripheral vascular disease, unspecified: Secondary | ICD-10-CM | POA: Diagnosis not present

## 2016-12-10 DIAGNOSIS — L89210 Pressure ulcer of right hip, unstageable: Secondary | ICD-10-CM | POA: Diagnosis not present

## 2016-12-10 DIAGNOSIS — I10 Essential (primary) hypertension: Secondary | ICD-10-CM | POA: Diagnosis not present

## 2016-12-10 DIAGNOSIS — S065X0D Traumatic subdural hemorrhage without loss of consciousness, subsequent encounter: Secondary | ICD-10-CM | POA: Diagnosis not present

## 2016-12-10 DIAGNOSIS — F0390 Unspecified dementia without behavioral disturbance: Secondary | ICD-10-CM | POA: Diagnosis not present

## 2016-12-13 DIAGNOSIS — I6529 Occlusion and stenosis of unspecified carotid artery: Secondary | ICD-10-CM | POA: Diagnosis not present

## 2016-12-13 DIAGNOSIS — I739 Peripheral vascular disease, unspecified: Secondary | ICD-10-CM | POA: Diagnosis not present

## 2016-12-13 DIAGNOSIS — F0390 Unspecified dementia without behavioral disturbance: Secondary | ICD-10-CM | POA: Diagnosis not present

## 2016-12-13 DIAGNOSIS — S065X0D Traumatic subdural hemorrhage without loss of consciousness, subsequent encounter: Secondary | ICD-10-CM | POA: Diagnosis not present

## 2016-12-13 DIAGNOSIS — L89210 Pressure ulcer of right hip, unstageable: Secondary | ICD-10-CM | POA: Diagnosis not present

## 2016-12-13 DIAGNOSIS — I10 Essential (primary) hypertension: Secondary | ICD-10-CM | POA: Diagnosis not present

## 2016-12-14 DIAGNOSIS — I739 Peripheral vascular disease, unspecified: Secondary | ICD-10-CM | POA: Diagnosis not present

## 2016-12-14 DIAGNOSIS — I6529 Occlusion and stenosis of unspecified carotid artery: Secondary | ICD-10-CM | POA: Diagnosis not present

## 2016-12-14 DIAGNOSIS — F0390 Unspecified dementia without behavioral disturbance: Secondary | ICD-10-CM | POA: Diagnosis not present

## 2016-12-14 DIAGNOSIS — S065X0D Traumatic subdural hemorrhage without loss of consciousness, subsequent encounter: Secondary | ICD-10-CM | POA: Diagnosis not present

## 2016-12-14 DIAGNOSIS — I10 Essential (primary) hypertension: Secondary | ICD-10-CM | POA: Diagnosis not present

## 2016-12-14 DIAGNOSIS — L89210 Pressure ulcer of right hip, unstageable: Secondary | ICD-10-CM | POA: Diagnosis not present

## 2016-12-15 DIAGNOSIS — I10 Essential (primary) hypertension: Secondary | ICD-10-CM | POA: Diagnosis not present

## 2016-12-15 DIAGNOSIS — S065X0D Traumatic subdural hemorrhage without loss of consciousness, subsequent encounter: Secondary | ICD-10-CM | POA: Diagnosis not present

## 2016-12-15 DIAGNOSIS — I6529 Occlusion and stenosis of unspecified carotid artery: Secondary | ICD-10-CM | POA: Diagnosis not present

## 2016-12-15 DIAGNOSIS — F0390 Unspecified dementia without behavioral disturbance: Secondary | ICD-10-CM | POA: Diagnosis not present

## 2016-12-15 DIAGNOSIS — I739 Peripheral vascular disease, unspecified: Secondary | ICD-10-CM | POA: Diagnosis not present

## 2016-12-15 DIAGNOSIS — L89210 Pressure ulcer of right hip, unstageable: Secondary | ICD-10-CM | POA: Diagnosis not present

## 2016-12-16 DIAGNOSIS — F0390 Unspecified dementia without behavioral disturbance: Secondary | ICD-10-CM | POA: Diagnosis not present

## 2016-12-16 DIAGNOSIS — I6529 Occlusion and stenosis of unspecified carotid artery: Secondary | ICD-10-CM | POA: Diagnosis not present

## 2016-12-16 DIAGNOSIS — L89210 Pressure ulcer of right hip, unstageable: Secondary | ICD-10-CM | POA: Diagnosis not present

## 2016-12-16 DIAGNOSIS — I10 Essential (primary) hypertension: Secondary | ICD-10-CM | POA: Diagnosis not present

## 2016-12-16 DIAGNOSIS — I739 Peripheral vascular disease, unspecified: Secondary | ICD-10-CM | POA: Diagnosis not present

## 2016-12-16 DIAGNOSIS — S065X0D Traumatic subdural hemorrhage without loss of consciousness, subsequent encounter: Secondary | ICD-10-CM | POA: Diagnosis not present

## 2016-12-17 DIAGNOSIS — I739 Peripheral vascular disease, unspecified: Secondary | ICD-10-CM | POA: Diagnosis not present

## 2016-12-17 DIAGNOSIS — S065X0D Traumatic subdural hemorrhage without loss of consciousness, subsequent encounter: Secondary | ICD-10-CM | POA: Diagnosis not present

## 2016-12-17 DIAGNOSIS — I6529 Occlusion and stenosis of unspecified carotid artery: Secondary | ICD-10-CM | POA: Diagnosis not present

## 2016-12-17 DIAGNOSIS — I10 Essential (primary) hypertension: Secondary | ICD-10-CM | POA: Diagnosis not present

## 2016-12-17 DIAGNOSIS — F0390 Unspecified dementia without behavioral disturbance: Secondary | ICD-10-CM | POA: Diagnosis not present

## 2016-12-17 DIAGNOSIS — L89210 Pressure ulcer of right hip, unstageable: Secondary | ICD-10-CM | POA: Diagnosis not present

## 2016-12-20 DIAGNOSIS — I10 Essential (primary) hypertension: Secondary | ICD-10-CM | POA: Diagnosis not present

## 2016-12-20 DIAGNOSIS — I739 Peripheral vascular disease, unspecified: Secondary | ICD-10-CM | POA: Diagnosis not present

## 2016-12-20 DIAGNOSIS — L89210 Pressure ulcer of right hip, unstageable: Secondary | ICD-10-CM | POA: Diagnosis not present

## 2016-12-20 DIAGNOSIS — S065X0D Traumatic subdural hemorrhage without loss of consciousness, subsequent encounter: Secondary | ICD-10-CM | POA: Diagnosis not present

## 2016-12-20 DIAGNOSIS — F0390 Unspecified dementia without behavioral disturbance: Secondary | ICD-10-CM | POA: Diagnosis not present

## 2016-12-20 DIAGNOSIS — I6529 Occlusion and stenosis of unspecified carotid artery: Secondary | ICD-10-CM | POA: Diagnosis not present

## 2016-12-21 DIAGNOSIS — I10 Essential (primary) hypertension: Secondary | ICD-10-CM | POA: Diagnosis not present

## 2016-12-21 DIAGNOSIS — L89210 Pressure ulcer of right hip, unstageable: Secondary | ICD-10-CM | POA: Diagnosis not present

## 2016-12-21 DIAGNOSIS — F0390 Unspecified dementia without behavioral disturbance: Secondary | ICD-10-CM | POA: Diagnosis not present

## 2016-12-21 DIAGNOSIS — I6529 Occlusion and stenosis of unspecified carotid artery: Secondary | ICD-10-CM | POA: Diagnosis not present

## 2016-12-21 DIAGNOSIS — S065X0D Traumatic subdural hemorrhage without loss of consciousness, subsequent encounter: Secondary | ICD-10-CM | POA: Diagnosis not present

## 2016-12-21 DIAGNOSIS — I739 Peripheral vascular disease, unspecified: Secondary | ICD-10-CM | POA: Diagnosis not present

## 2016-12-22 DIAGNOSIS — L89210 Pressure ulcer of right hip, unstageable: Secondary | ICD-10-CM | POA: Diagnosis not present

## 2016-12-22 DIAGNOSIS — I739 Peripheral vascular disease, unspecified: Secondary | ICD-10-CM | POA: Diagnosis not present

## 2016-12-22 DIAGNOSIS — I10 Essential (primary) hypertension: Secondary | ICD-10-CM | POA: Diagnosis not present

## 2016-12-22 DIAGNOSIS — F0390 Unspecified dementia without behavioral disturbance: Secondary | ICD-10-CM | POA: Diagnosis not present

## 2016-12-22 DIAGNOSIS — S065X0D Traumatic subdural hemorrhage without loss of consciousness, subsequent encounter: Secondary | ICD-10-CM | POA: Diagnosis not present

## 2016-12-22 DIAGNOSIS — I6529 Occlusion and stenosis of unspecified carotid artery: Secondary | ICD-10-CM | POA: Diagnosis not present

## 2016-12-23 DIAGNOSIS — I10 Essential (primary) hypertension: Secondary | ICD-10-CM | POA: Diagnosis not present

## 2016-12-23 DIAGNOSIS — I739 Peripheral vascular disease, unspecified: Secondary | ICD-10-CM | POA: Diagnosis not present

## 2016-12-23 DIAGNOSIS — L89210 Pressure ulcer of right hip, unstageable: Secondary | ICD-10-CM | POA: Diagnosis not present

## 2016-12-23 DIAGNOSIS — S065X0D Traumatic subdural hemorrhage without loss of consciousness, subsequent encounter: Secondary | ICD-10-CM | POA: Diagnosis not present

## 2016-12-23 DIAGNOSIS — F0390 Unspecified dementia without behavioral disturbance: Secondary | ICD-10-CM | POA: Diagnosis not present

## 2016-12-23 DIAGNOSIS — I6529 Occlusion and stenosis of unspecified carotid artery: Secondary | ICD-10-CM | POA: Diagnosis not present

## 2016-12-24 DIAGNOSIS — I10 Essential (primary) hypertension: Secondary | ICD-10-CM | POA: Diagnosis not present

## 2016-12-24 DIAGNOSIS — I739 Peripheral vascular disease, unspecified: Secondary | ICD-10-CM | POA: Diagnosis not present

## 2016-12-24 DIAGNOSIS — I6529 Occlusion and stenosis of unspecified carotid artery: Secondary | ICD-10-CM | POA: Diagnosis not present

## 2016-12-24 DIAGNOSIS — S065X0D Traumatic subdural hemorrhage without loss of consciousness, subsequent encounter: Secondary | ICD-10-CM | POA: Diagnosis not present

## 2016-12-24 DIAGNOSIS — L89210 Pressure ulcer of right hip, unstageable: Secondary | ICD-10-CM | POA: Diagnosis not present

## 2016-12-24 DIAGNOSIS — F0390 Unspecified dementia without behavioral disturbance: Secondary | ICD-10-CM | POA: Diagnosis not present

## 2016-12-27 DIAGNOSIS — S81811D Laceration without foreign body, right lower leg, subsequent encounter: Secondary | ICD-10-CM | POA: Diagnosis not present

## 2016-12-27 DIAGNOSIS — E785 Hyperlipidemia, unspecified: Secondary | ICD-10-CM | POA: Diagnosis not present

## 2016-12-27 DIAGNOSIS — I739 Peripheral vascular disease, unspecified: Secondary | ICD-10-CM | POA: Diagnosis not present

## 2016-12-27 DIAGNOSIS — M1991 Primary osteoarthritis, unspecified site: Secondary | ICD-10-CM | POA: Diagnosis not present

## 2016-12-27 DIAGNOSIS — K5909 Other constipation: Secondary | ICD-10-CM | POA: Diagnosis not present

## 2016-12-27 DIAGNOSIS — F0390 Unspecified dementia without behavioral disturbance: Secondary | ICD-10-CM | POA: Diagnosis not present

## 2016-12-27 DIAGNOSIS — S41011D Laceration without foreign body of right shoulder, subsequent encounter: Secondary | ICD-10-CM | POA: Diagnosis not present

## 2016-12-27 DIAGNOSIS — I10 Essential (primary) hypertension: Secondary | ICD-10-CM | POA: Diagnosis not present

## 2016-12-27 DIAGNOSIS — L89210 Pressure ulcer of right hip, unstageable: Secondary | ICD-10-CM | POA: Diagnosis not present

## 2016-12-27 DIAGNOSIS — S065X0D Traumatic subdural hemorrhage without loss of consciousness, subsequent encounter: Secondary | ICD-10-CM | POA: Diagnosis not present

## 2016-12-27 DIAGNOSIS — I6529 Occlusion and stenosis of unspecified carotid artery: Secondary | ICD-10-CM | POA: Diagnosis not present

## 2016-12-28 DIAGNOSIS — S41011D Laceration without foreign body of right shoulder, subsequent encounter: Secondary | ICD-10-CM | POA: Diagnosis not present

## 2016-12-28 DIAGNOSIS — I10 Essential (primary) hypertension: Secondary | ICD-10-CM | POA: Diagnosis not present

## 2016-12-28 DIAGNOSIS — S065X0D Traumatic subdural hemorrhage without loss of consciousness, subsequent encounter: Secondary | ICD-10-CM | POA: Diagnosis not present

## 2016-12-28 DIAGNOSIS — M1991 Primary osteoarthritis, unspecified site: Secondary | ICD-10-CM | POA: Diagnosis not present

## 2016-12-28 DIAGNOSIS — F0390 Unspecified dementia without behavioral disturbance: Secondary | ICD-10-CM | POA: Diagnosis not present

## 2016-12-28 DIAGNOSIS — E785 Hyperlipidemia, unspecified: Secondary | ICD-10-CM | POA: Diagnosis not present

## 2016-12-29 ENCOUNTER — Ambulatory Visit: Payer: Medicare Other | Admitting: Family Medicine

## 2016-12-29 DIAGNOSIS — I10 Essential (primary) hypertension: Secondary | ICD-10-CM | POA: Diagnosis not present

## 2016-12-29 DIAGNOSIS — M1991 Primary osteoarthritis, unspecified site: Secondary | ICD-10-CM | POA: Diagnosis not present

## 2016-12-29 DIAGNOSIS — S065X0D Traumatic subdural hemorrhage without loss of consciousness, subsequent encounter: Secondary | ICD-10-CM | POA: Diagnosis not present

## 2016-12-29 DIAGNOSIS — S41011D Laceration without foreign body of right shoulder, subsequent encounter: Secondary | ICD-10-CM | POA: Diagnosis not present

## 2016-12-29 DIAGNOSIS — E785 Hyperlipidemia, unspecified: Secondary | ICD-10-CM | POA: Diagnosis not present

## 2016-12-29 DIAGNOSIS — F0390 Unspecified dementia without behavioral disturbance: Secondary | ICD-10-CM | POA: Diagnosis not present

## 2016-12-30 DIAGNOSIS — S065X0D Traumatic subdural hemorrhage without loss of consciousness, subsequent encounter: Secondary | ICD-10-CM | POA: Diagnosis not present

## 2016-12-30 DIAGNOSIS — I10 Essential (primary) hypertension: Secondary | ICD-10-CM | POA: Diagnosis not present

## 2016-12-30 DIAGNOSIS — M1991 Primary osteoarthritis, unspecified site: Secondary | ICD-10-CM | POA: Diagnosis not present

## 2016-12-30 DIAGNOSIS — S41011D Laceration without foreign body of right shoulder, subsequent encounter: Secondary | ICD-10-CM | POA: Diagnosis not present

## 2016-12-30 DIAGNOSIS — E785 Hyperlipidemia, unspecified: Secondary | ICD-10-CM | POA: Diagnosis not present

## 2016-12-30 DIAGNOSIS — F0390 Unspecified dementia without behavioral disturbance: Secondary | ICD-10-CM | POA: Diagnosis not present

## 2016-12-31 DIAGNOSIS — M1991 Primary osteoarthritis, unspecified site: Secondary | ICD-10-CM | POA: Diagnosis not present

## 2016-12-31 DIAGNOSIS — S065X0D Traumatic subdural hemorrhage without loss of consciousness, subsequent encounter: Secondary | ICD-10-CM | POA: Diagnosis not present

## 2016-12-31 DIAGNOSIS — I10 Essential (primary) hypertension: Secondary | ICD-10-CM | POA: Diagnosis not present

## 2016-12-31 DIAGNOSIS — S41011D Laceration without foreign body of right shoulder, subsequent encounter: Secondary | ICD-10-CM | POA: Diagnosis not present

## 2016-12-31 DIAGNOSIS — F0390 Unspecified dementia without behavioral disturbance: Secondary | ICD-10-CM | POA: Diagnosis not present

## 2016-12-31 DIAGNOSIS — E785 Hyperlipidemia, unspecified: Secondary | ICD-10-CM | POA: Diagnosis not present

## 2017-01-03 DIAGNOSIS — M1991 Primary osteoarthritis, unspecified site: Secondary | ICD-10-CM | POA: Diagnosis not present

## 2017-01-03 DIAGNOSIS — F0390 Unspecified dementia without behavioral disturbance: Secondary | ICD-10-CM | POA: Diagnosis not present

## 2017-01-03 DIAGNOSIS — S065X0D Traumatic subdural hemorrhage without loss of consciousness, subsequent encounter: Secondary | ICD-10-CM | POA: Diagnosis not present

## 2017-01-03 DIAGNOSIS — E785 Hyperlipidemia, unspecified: Secondary | ICD-10-CM | POA: Diagnosis not present

## 2017-01-03 DIAGNOSIS — I10 Essential (primary) hypertension: Secondary | ICD-10-CM | POA: Diagnosis not present

## 2017-01-03 DIAGNOSIS — S41011D Laceration without foreign body of right shoulder, subsequent encounter: Secondary | ICD-10-CM | POA: Diagnosis not present

## 2017-01-04 DIAGNOSIS — E785 Hyperlipidemia, unspecified: Secondary | ICD-10-CM | POA: Diagnosis not present

## 2017-01-04 DIAGNOSIS — I10 Essential (primary) hypertension: Secondary | ICD-10-CM | POA: Diagnosis not present

## 2017-01-04 DIAGNOSIS — S41011D Laceration without foreign body of right shoulder, subsequent encounter: Secondary | ICD-10-CM | POA: Diagnosis not present

## 2017-01-04 DIAGNOSIS — F0390 Unspecified dementia without behavioral disturbance: Secondary | ICD-10-CM | POA: Diagnosis not present

## 2017-01-04 DIAGNOSIS — S065X0D Traumatic subdural hemorrhage without loss of consciousness, subsequent encounter: Secondary | ICD-10-CM | POA: Diagnosis not present

## 2017-01-04 DIAGNOSIS — M1991 Primary osteoarthritis, unspecified site: Secondary | ICD-10-CM | POA: Diagnosis not present

## 2017-01-05 DIAGNOSIS — E785 Hyperlipidemia, unspecified: Secondary | ICD-10-CM | POA: Diagnosis not present

## 2017-01-05 DIAGNOSIS — S065X0D Traumatic subdural hemorrhage without loss of consciousness, subsequent encounter: Secondary | ICD-10-CM | POA: Diagnosis not present

## 2017-01-05 DIAGNOSIS — F0390 Unspecified dementia without behavioral disturbance: Secondary | ICD-10-CM | POA: Diagnosis not present

## 2017-01-05 DIAGNOSIS — M1991 Primary osteoarthritis, unspecified site: Secondary | ICD-10-CM | POA: Diagnosis not present

## 2017-01-05 DIAGNOSIS — S41011D Laceration without foreign body of right shoulder, subsequent encounter: Secondary | ICD-10-CM | POA: Diagnosis not present

## 2017-01-05 DIAGNOSIS — I10 Essential (primary) hypertension: Secondary | ICD-10-CM | POA: Diagnosis not present

## 2017-01-06 DIAGNOSIS — I10 Essential (primary) hypertension: Secondary | ICD-10-CM | POA: Diagnosis not present

## 2017-01-06 DIAGNOSIS — F0390 Unspecified dementia without behavioral disturbance: Secondary | ICD-10-CM | POA: Diagnosis not present

## 2017-01-06 DIAGNOSIS — S065X0D Traumatic subdural hemorrhage without loss of consciousness, subsequent encounter: Secondary | ICD-10-CM | POA: Diagnosis not present

## 2017-01-06 DIAGNOSIS — M1991 Primary osteoarthritis, unspecified site: Secondary | ICD-10-CM | POA: Diagnosis not present

## 2017-01-06 DIAGNOSIS — S41011D Laceration without foreign body of right shoulder, subsequent encounter: Secondary | ICD-10-CM | POA: Diagnosis not present

## 2017-01-06 DIAGNOSIS — E785 Hyperlipidemia, unspecified: Secondary | ICD-10-CM | POA: Diagnosis not present

## 2017-01-07 DIAGNOSIS — M1991 Primary osteoarthritis, unspecified site: Secondary | ICD-10-CM | POA: Diagnosis not present

## 2017-01-07 DIAGNOSIS — F0390 Unspecified dementia without behavioral disturbance: Secondary | ICD-10-CM | POA: Diagnosis not present

## 2017-01-07 DIAGNOSIS — S065X0D Traumatic subdural hemorrhage without loss of consciousness, subsequent encounter: Secondary | ICD-10-CM | POA: Diagnosis not present

## 2017-01-07 DIAGNOSIS — I10 Essential (primary) hypertension: Secondary | ICD-10-CM | POA: Diagnosis not present

## 2017-01-07 DIAGNOSIS — S41011D Laceration without foreign body of right shoulder, subsequent encounter: Secondary | ICD-10-CM | POA: Diagnosis not present

## 2017-01-07 DIAGNOSIS — E785 Hyperlipidemia, unspecified: Secondary | ICD-10-CM | POA: Diagnosis not present

## 2017-01-10 DIAGNOSIS — F0390 Unspecified dementia without behavioral disturbance: Secondary | ICD-10-CM | POA: Diagnosis not present

## 2017-01-10 DIAGNOSIS — I10 Essential (primary) hypertension: Secondary | ICD-10-CM | POA: Diagnosis not present

## 2017-01-10 DIAGNOSIS — M1991 Primary osteoarthritis, unspecified site: Secondary | ICD-10-CM | POA: Diagnosis not present

## 2017-01-10 DIAGNOSIS — S065X0D Traumatic subdural hemorrhage without loss of consciousness, subsequent encounter: Secondary | ICD-10-CM | POA: Diagnosis not present

## 2017-01-10 DIAGNOSIS — S41011D Laceration without foreign body of right shoulder, subsequent encounter: Secondary | ICD-10-CM | POA: Diagnosis not present

## 2017-01-10 DIAGNOSIS — E785 Hyperlipidemia, unspecified: Secondary | ICD-10-CM | POA: Diagnosis not present

## 2017-01-11 DIAGNOSIS — M1991 Primary osteoarthritis, unspecified site: Secondary | ICD-10-CM | POA: Diagnosis not present

## 2017-01-11 DIAGNOSIS — S41011D Laceration without foreign body of right shoulder, subsequent encounter: Secondary | ICD-10-CM | POA: Diagnosis not present

## 2017-01-11 DIAGNOSIS — S065X0D Traumatic subdural hemorrhage without loss of consciousness, subsequent encounter: Secondary | ICD-10-CM | POA: Diagnosis not present

## 2017-01-11 DIAGNOSIS — E785 Hyperlipidemia, unspecified: Secondary | ICD-10-CM | POA: Diagnosis not present

## 2017-01-11 DIAGNOSIS — F0390 Unspecified dementia without behavioral disturbance: Secondary | ICD-10-CM | POA: Diagnosis not present

## 2017-01-11 DIAGNOSIS — I10 Essential (primary) hypertension: Secondary | ICD-10-CM | POA: Diagnosis not present

## 2017-01-12 DIAGNOSIS — S41011D Laceration without foreign body of right shoulder, subsequent encounter: Secondary | ICD-10-CM | POA: Diagnosis not present

## 2017-01-12 DIAGNOSIS — I10 Essential (primary) hypertension: Secondary | ICD-10-CM | POA: Diagnosis not present

## 2017-01-12 DIAGNOSIS — S065X0D Traumatic subdural hemorrhage without loss of consciousness, subsequent encounter: Secondary | ICD-10-CM | POA: Diagnosis not present

## 2017-01-12 DIAGNOSIS — M1991 Primary osteoarthritis, unspecified site: Secondary | ICD-10-CM | POA: Diagnosis not present

## 2017-01-12 DIAGNOSIS — F0390 Unspecified dementia without behavioral disturbance: Secondary | ICD-10-CM | POA: Diagnosis not present

## 2017-01-12 DIAGNOSIS — E785 Hyperlipidemia, unspecified: Secondary | ICD-10-CM | POA: Diagnosis not present

## 2017-01-13 DIAGNOSIS — S41011D Laceration without foreign body of right shoulder, subsequent encounter: Secondary | ICD-10-CM | POA: Diagnosis not present

## 2017-01-13 DIAGNOSIS — S065X0D Traumatic subdural hemorrhage without loss of consciousness, subsequent encounter: Secondary | ICD-10-CM | POA: Diagnosis not present

## 2017-01-13 DIAGNOSIS — I10 Essential (primary) hypertension: Secondary | ICD-10-CM | POA: Diagnosis not present

## 2017-01-13 DIAGNOSIS — E785 Hyperlipidemia, unspecified: Secondary | ICD-10-CM | POA: Diagnosis not present

## 2017-01-13 DIAGNOSIS — F0390 Unspecified dementia without behavioral disturbance: Secondary | ICD-10-CM | POA: Diagnosis not present

## 2017-01-13 DIAGNOSIS — M1991 Primary osteoarthritis, unspecified site: Secondary | ICD-10-CM | POA: Diagnosis not present

## 2017-01-14 DIAGNOSIS — F0390 Unspecified dementia without behavioral disturbance: Secondary | ICD-10-CM | POA: Diagnosis not present

## 2017-01-14 DIAGNOSIS — S065X0D Traumatic subdural hemorrhage without loss of consciousness, subsequent encounter: Secondary | ICD-10-CM | POA: Diagnosis not present

## 2017-01-14 DIAGNOSIS — S41011D Laceration without foreign body of right shoulder, subsequent encounter: Secondary | ICD-10-CM | POA: Diagnosis not present

## 2017-01-14 DIAGNOSIS — I10 Essential (primary) hypertension: Secondary | ICD-10-CM | POA: Diagnosis not present

## 2017-01-14 DIAGNOSIS — E785 Hyperlipidemia, unspecified: Secondary | ICD-10-CM | POA: Diagnosis not present

## 2017-01-14 DIAGNOSIS — M1991 Primary osteoarthritis, unspecified site: Secondary | ICD-10-CM | POA: Diagnosis not present

## 2017-01-17 DIAGNOSIS — I10 Essential (primary) hypertension: Secondary | ICD-10-CM | POA: Diagnosis not present

## 2017-01-17 DIAGNOSIS — S065X0D Traumatic subdural hemorrhage without loss of consciousness, subsequent encounter: Secondary | ICD-10-CM | POA: Diagnosis not present

## 2017-01-17 DIAGNOSIS — E785 Hyperlipidemia, unspecified: Secondary | ICD-10-CM | POA: Diagnosis not present

## 2017-01-17 DIAGNOSIS — M1991 Primary osteoarthritis, unspecified site: Secondary | ICD-10-CM | POA: Diagnosis not present

## 2017-01-17 DIAGNOSIS — S41011D Laceration without foreign body of right shoulder, subsequent encounter: Secondary | ICD-10-CM | POA: Diagnosis not present

## 2017-01-17 DIAGNOSIS — F0390 Unspecified dementia without behavioral disturbance: Secondary | ICD-10-CM | POA: Diagnosis not present

## 2017-01-18 DIAGNOSIS — S065X0D Traumatic subdural hemorrhage without loss of consciousness, subsequent encounter: Secondary | ICD-10-CM | POA: Diagnosis not present

## 2017-01-18 DIAGNOSIS — S41011D Laceration without foreign body of right shoulder, subsequent encounter: Secondary | ICD-10-CM | POA: Diagnosis not present

## 2017-01-18 DIAGNOSIS — F0390 Unspecified dementia without behavioral disturbance: Secondary | ICD-10-CM | POA: Diagnosis not present

## 2017-01-18 DIAGNOSIS — M1991 Primary osteoarthritis, unspecified site: Secondary | ICD-10-CM | POA: Diagnosis not present

## 2017-01-18 DIAGNOSIS — I10 Essential (primary) hypertension: Secondary | ICD-10-CM | POA: Diagnosis not present

## 2017-01-18 DIAGNOSIS — E785 Hyperlipidemia, unspecified: Secondary | ICD-10-CM | POA: Diagnosis not present

## 2017-01-19 DIAGNOSIS — S41011D Laceration without foreign body of right shoulder, subsequent encounter: Secondary | ICD-10-CM | POA: Diagnosis not present

## 2017-01-19 DIAGNOSIS — E785 Hyperlipidemia, unspecified: Secondary | ICD-10-CM | POA: Diagnosis not present

## 2017-01-19 DIAGNOSIS — F0390 Unspecified dementia without behavioral disturbance: Secondary | ICD-10-CM | POA: Diagnosis not present

## 2017-01-19 DIAGNOSIS — S065X0D Traumatic subdural hemorrhage without loss of consciousness, subsequent encounter: Secondary | ICD-10-CM | POA: Diagnosis not present

## 2017-01-19 DIAGNOSIS — M1991 Primary osteoarthritis, unspecified site: Secondary | ICD-10-CM | POA: Diagnosis not present

## 2017-01-19 DIAGNOSIS — I10 Essential (primary) hypertension: Secondary | ICD-10-CM | POA: Diagnosis not present

## 2017-01-20 DIAGNOSIS — I10 Essential (primary) hypertension: Secondary | ICD-10-CM | POA: Diagnosis not present

## 2017-01-20 DIAGNOSIS — S065X0D Traumatic subdural hemorrhage without loss of consciousness, subsequent encounter: Secondary | ICD-10-CM | POA: Diagnosis not present

## 2017-01-20 DIAGNOSIS — F0390 Unspecified dementia without behavioral disturbance: Secondary | ICD-10-CM | POA: Diagnosis not present

## 2017-01-20 DIAGNOSIS — S41011D Laceration without foreign body of right shoulder, subsequent encounter: Secondary | ICD-10-CM | POA: Diagnosis not present

## 2017-01-20 DIAGNOSIS — M1991 Primary osteoarthritis, unspecified site: Secondary | ICD-10-CM | POA: Diagnosis not present

## 2017-01-20 DIAGNOSIS — E785 Hyperlipidemia, unspecified: Secondary | ICD-10-CM | POA: Diagnosis not present

## 2017-01-21 DIAGNOSIS — I10 Essential (primary) hypertension: Secondary | ICD-10-CM | POA: Diagnosis not present

## 2017-01-21 DIAGNOSIS — E785 Hyperlipidemia, unspecified: Secondary | ICD-10-CM | POA: Diagnosis not present

## 2017-01-21 DIAGNOSIS — S065X0D Traumatic subdural hemorrhage without loss of consciousness, subsequent encounter: Secondary | ICD-10-CM | POA: Diagnosis not present

## 2017-01-21 DIAGNOSIS — S41011D Laceration without foreign body of right shoulder, subsequent encounter: Secondary | ICD-10-CM | POA: Diagnosis not present

## 2017-01-21 DIAGNOSIS — F0390 Unspecified dementia without behavioral disturbance: Secondary | ICD-10-CM | POA: Diagnosis not present

## 2017-01-21 DIAGNOSIS — M1991 Primary osteoarthritis, unspecified site: Secondary | ICD-10-CM | POA: Diagnosis not present

## 2017-01-24 DIAGNOSIS — S065X0D Traumatic subdural hemorrhage without loss of consciousness, subsequent encounter: Secondary | ICD-10-CM | POA: Diagnosis not present

## 2017-01-24 DIAGNOSIS — F0390 Unspecified dementia without behavioral disturbance: Secondary | ICD-10-CM | POA: Diagnosis not present

## 2017-01-24 DIAGNOSIS — E785 Hyperlipidemia, unspecified: Secondary | ICD-10-CM | POA: Diagnosis not present

## 2017-01-24 DIAGNOSIS — M1991 Primary osteoarthritis, unspecified site: Secondary | ICD-10-CM | POA: Diagnosis not present

## 2017-01-24 DIAGNOSIS — I10 Essential (primary) hypertension: Secondary | ICD-10-CM | POA: Diagnosis not present

## 2017-01-24 DIAGNOSIS — S41011D Laceration without foreign body of right shoulder, subsequent encounter: Secondary | ICD-10-CM | POA: Diagnosis not present

## 2017-01-25 DIAGNOSIS — F0390 Unspecified dementia without behavioral disturbance: Secondary | ICD-10-CM | POA: Diagnosis not present

## 2017-01-25 DIAGNOSIS — E785 Hyperlipidemia, unspecified: Secondary | ICD-10-CM | POA: Diagnosis not present

## 2017-01-25 DIAGNOSIS — I10 Essential (primary) hypertension: Secondary | ICD-10-CM | POA: Diagnosis not present

## 2017-01-25 DIAGNOSIS — S41011D Laceration without foreign body of right shoulder, subsequent encounter: Secondary | ICD-10-CM | POA: Diagnosis not present

## 2017-01-25 DIAGNOSIS — S065X0D Traumatic subdural hemorrhage without loss of consciousness, subsequent encounter: Secondary | ICD-10-CM | POA: Diagnosis not present

## 2017-01-25 DIAGNOSIS — M1991 Primary osteoarthritis, unspecified site: Secondary | ICD-10-CM | POA: Diagnosis not present

## 2017-01-26 DIAGNOSIS — S065X0D Traumatic subdural hemorrhage without loss of consciousness, subsequent encounter: Secondary | ICD-10-CM | POA: Diagnosis not present

## 2017-01-26 DIAGNOSIS — M1991 Primary osteoarthritis, unspecified site: Secondary | ICD-10-CM | POA: Diagnosis not present

## 2017-01-26 DIAGNOSIS — F0390 Unspecified dementia without behavioral disturbance: Secondary | ICD-10-CM | POA: Diagnosis not present

## 2017-01-26 DIAGNOSIS — E785 Hyperlipidemia, unspecified: Secondary | ICD-10-CM | POA: Diagnosis not present

## 2017-01-26 DIAGNOSIS — S41011D Laceration without foreign body of right shoulder, subsequent encounter: Secondary | ICD-10-CM | POA: Diagnosis not present

## 2017-01-26 DIAGNOSIS — I10 Essential (primary) hypertension: Secondary | ICD-10-CM | POA: Diagnosis not present

## 2017-01-27 DIAGNOSIS — K5909 Other constipation: Secondary | ICD-10-CM | POA: Diagnosis not present

## 2017-01-27 DIAGNOSIS — I6529 Occlusion and stenosis of unspecified carotid artery: Secondary | ICD-10-CM | POA: Diagnosis not present

## 2017-01-27 DIAGNOSIS — E785 Hyperlipidemia, unspecified: Secondary | ICD-10-CM | POA: Diagnosis not present

## 2017-01-27 DIAGNOSIS — S51012D Laceration without foreign body of left elbow, subsequent encounter: Secondary | ICD-10-CM | POA: Diagnosis not present

## 2017-01-27 DIAGNOSIS — S81811D Laceration without foreign body, right lower leg, subsequent encounter: Secondary | ICD-10-CM | POA: Diagnosis not present

## 2017-01-27 DIAGNOSIS — F0390 Unspecified dementia without behavioral disturbance: Secondary | ICD-10-CM | POA: Diagnosis not present

## 2017-01-27 DIAGNOSIS — R634 Abnormal weight loss: Secondary | ICD-10-CM | POA: Diagnosis not present

## 2017-01-27 DIAGNOSIS — I10 Essential (primary) hypertension: Secondary | ICD-10-CM | POA: Diagnosis not present

## 2017-01-27 DIAGNOSIS — M1991 Primary osteoarthritis, unspecified site: Secondary | ICD-10-CM | POA: Diagnosis not present

## 2017-01-27 DIAGNOSIS — I739 Peripheral vascular disease, unspecified: Secondary | ICD-10-CM | POA: Diagnosis not present

## 2017-01-27 DIAGNOSIS — S065X0D Traumatic subdural hemorrhage without loss of consciousness, subsequent encounter: Secondary | ICD-10-CM | POA: Diagnosis not present

## 2017-01-28 DIAGNOSIS — S81811D Laceration without foreign body, right lower leg, subsequent encounter: Secondary | ICD-10-CM | POA: Diagnosis not present

## 2017-01-28 DIAGNOSIS — S065X0D Traumatic subdural hemorrhage without loss of consciousness, subsequent encounter: Secondary | ICD-10-CM | POA: Diagnosis not present

## 2017-01-28 DIAGNOSIS — S51012D Laceration without foreign body of left elbow, subsequent encounter: Secondary | ICD-10-CM | POA: Diagnosis not present

## 2017-01-28 DIAGNOSIS — I739 Peripheral vascular disease, unspecified: Secondary | ICD-10-CM | POA: Diagnosis not present

## 2017-01-28 DIAGNOSIS — F0390 Unspecified dementia without behavioral disturbance: Secondary | ICD-10-CM | POA: Diagnosis not present

## 2017-01-28 DIAGNOSIS — I6529 Occlusion and stenosis of unspecified carotid artery: Secondary | ICD-10-CM | POA: Diagnosis not present

## 2017-01-31 DIAGNOSIS — I739 Peripheral vascular disease, unspecified: Secondary | ICD-10-CM | POA: Diagnosis not present

## 2017-01-31 DIAGNOSIS — F0390 Unspecified dementia without behavioral disturbance: Secondary | ICD-10-CM | POA: Diagnosis not present

## 2017-01-31 DIAGNOSIS — S81811D Laceration without foreign body, right lower leg, subsequent encounter: Secondary | ICD-10-CM | POA: Diagnosis not present

## 2017-01-31 DIAGNOSIS — S065X0D Traumatic subdural hemorrhage without loss of consciousness, subsequent encounter: Secondary | ICD-10-CM | POA: Diagnosis not present

## 2017-01-31 DIAGNOSIS — I6529 Occlusion and stenosis of unspecified carotid artery: Secondary | ICD-10-CM | POA: Diagnosis not present

## 2017-01-31 DIAGNOSIS — S51012D Laceration without foreign body of left elbow, subsequent encounter: Secondary | ICD-10-CM | POA: Diagnosis not present

## 2017-02-01 DIAGNOSIS — F0390 Unspecified dementia without behavioral disturbance: Secondary | ICD-10-CM | POA: Diagnosis not present

## 2017-02-01 DIAGNOSIS — S065X0D Traumatic subdural hemorrhage without loss of consciousness, subsequent encounter: Secondary | ICD-10-CM | POA: Diagnosis not present

## 2017-02-01 DIAGNOSIS — I6529 Occlusion and stenosis of unspecified carotid artery: Secondary | ICD-10-CM | POA: Diagnosis not present

## 2017-02-01 DIAGNOSIS — S81811D Laceration without foreign body, right lower leg, subsequent encounter: Secondary | ICD-10-CM | POA: Diagnosis not present

## 2017-02-01 DIAGNOSIS — S51012D Laceration without foreign body of left elbow, subsequent encounter: Secondary | ICD-10-CM | POA: Diagnosis not present

## 2017-02-01 DIAGNOSIS — I739 Peripheral vascular disease, unspecified: Secondary | ICD-10-CM | POA: Diagnosis not present

## 2017-02-02 DIAGNOSIS — S065X0D Traumatic subdural hemorrhage without loss of consciousness, subsequent encounter: Secondary | ICD-10-CM | POA: Diagnosis not present

## 2017-02-02 DIAGNOSIS — S51012D Laceration without foreign body of left elbow, subsequent encounter: Secondary | ICD-10-CM | POA: Diagnosis not present

## 2017-02-02 DIAGNOSIS — F0390 Unspecified dementia without behavioral disturbance: Secondary | ICD-10-CM | POA: Diagnosis not present

## 2017-02-02 DIAGNOSIS — I6529 Occlusion and stenosis of unspecified carotid artery: Secondary | ICD-10-CM | POA: Diagnosis not present

## 2017-02-02 DIAGNOSIS — I739 Peripheral vascular disease, unspecified: Secondary | ICD-10-CM | POA: Diagnosis not present

## 2017-02-02 DIAGNOSIS — S81811D Laceration without foreign body, right lower leg, subsequent encounter: Secondary | ICD-10-CM | POA: Diagnosis not present

## 2017-02-03 DIAGNOSIS — S51012D Laceration without foreign body of left elbow, subsequent encounter: Secondary | ICD-10-CM | POA: Diagnosis not present

## 2017-02-03 DIAGNOSIS — I6529 Occlusion and stenosis of unspecified carotid artery: Secondary | ICD-10-CM | POA: Diagnosis not present

## 2017-02-03 DIAGNOSIS — I739 Peripheral vascular disease, unspecified: Secondary | ICD-10-CM | POA: Diagnosis not present

## 2017-02-03 DIAGNOSIS — S065X0D Traumatic subdural hemorrhage without loss of consciousness, subsequent encounter: Secondary | ICD-10-CM | POA: Diagnosis not present

## 2017-02-03 DIAGNOSIS — F0390 Unspecified dementia without behavioral disturbance: Secondary | ICD-10-CM | POA: Diagnosis not present

## 2017-02-03 DIAGNOSIS — S81811D Laceration without foreign body, right lower leg, subsequent encounter: Secondary | ICD-10-CM | POA: Diagnosis not present

## 2017-02-04 DIAGNOSIS — S51012D Laceration without foreign body of left elbow, subsequent encounter: Secondary | ICD-10-CM | POA: Diagnosis not present

## 2017-02-04 DIAGNOSIS — S065X0D Traumatic subdural hemorrhage without loss of consciousness, subsequent encounter: Secondary | ICD-10-CM | POA: Diagnosis not present

## 2017-02-04 DIAGNOSIS — I6529 Occlusion and stenosis of unspecified carotid artery: Secondary | ICD-10-CM | POA: Diagnosis not present

## 2017-02-04 DIAGNOSIS — I739 Peripheral vascular disease, unspecified: Secondary | ICD-10-CM | POA: Diagnosis not present

## 2017-02-04 DIAGNOSIS — F0390 Unspecified dementia without behavioral disturbance: Secondary | ICD-10-CM | POA: Diagnosis not present

## 2017-02-04 DIAGNOSIS — S81811D Laceration without foreign body, right lower leg, subsequent encounter: Secondary | ICD-10-CM | POA: Diagnosis not present

## 2017-02-07 DIAGNOSIS — F0390 Unspecified dementia without behavioral disturbance: Secondary | ICD-10-CM | POA: Diagnosis not present

## 2017-02-07 DIAGNOSIS — I739 Peripheral vascular disease, unspecified: Secondary | ICD-10-CM | POA: Diagnosis not present

## 2017-02-07 DIAGNOSIS — S065X0D Traumatic subdural hemorrhage without loss of consciousness, subsequent encounter: Secondary | ICD-10-CM | POA: Diagnosis not present

## 2017-02-07 DIAGNOSIS — S51012D Laceration without foreign body of left elbow, subsequent encounter: Secondary | ICD-10-CM | POA: Diagnosis not present

## 2017-02-07 DIAGNOSIS — I6529 Occlusion and stenosis of unspecified carotid artery: Secondary | ICD-10-CM | POA: Diagnosis not present

## 2017-02-07 DIAGNOSIS — S81811D Laceration without foreign body, right lower leg, subsequent encounter: Secondary | ICD-10-CM | POA: Diagnosis not present

## 2017-02-08 DIAGNOSIS — S51012D Laceration without foreign body of left elbow, subsequent encounter: Secondary | ICD-10-CM | POA: Diagnosis not present

## 2017-02-08 DIAGNOSIS — S065X0D Traumatic subdural hemorrhage without loss of consciousness, subsequent encounter: Secondary | ICD-10-CM | POA: Diagnosis not present

## 2017-02-08 DIAGNOSIS — I739 Peripheral vascular disease, unspecified: Secondary | ICD-10-CM | POA: Diagnosis not present

## 2017-02-08 DIAGNOSIS — F0390 Unspecified dementia without behavioral disturbance: Secondary | ICD-10-CM | POA: Diagnosis not present

## 2017-02-08 DIAGNOSIS — S81811D Laceration without foreign body, right lower leg, subsequent encounter: Secondary | ICD-10-CM | POA: Diagnosis not present

## 2017-02-08 DIAGNOSIS — I6529 Occlusion and stenosis of unspecified carotid artery: Secondary | ICD-10-CM | POA: Diagnosis not present

## 2017-02-09 DIAGNOSIS — I6529 Occlusion and stenosis of unspecified carotid artery: Secondary | ICD-10-CM | POA: Diagnosis not present

## 2017-02-09 DIAGNOSIS — I739 Peripheral vascular disease, unspecified: Secondary | ICD-10-CM | POA: Diagnosis not present

## 2017-02-09 DIAGNOSIS — S51012D Laceration without foreign body of left elbow, subsequent encounter: Secondary | ICD-10-CM | POA: Diagnosis not present

## 2017-02-09 DIAGNOSIS — S81811D Laceration without foreign body, right lower leg, subsequent encounter: Secondary | ICD-10-CM | POA: Diagnosis not present

## 2017-02-09 DIAGNOSIS — F0390 Unspecified dementia without behavioral disturbance: Secondary | ICD-10-CM | POA: Diagnosis not present

## 2017-02-09 DIAGNOSIS — S065X0D Traumatic subdural hemorrhage without loss of consciousness, subsequent encounter: Secondary | ICD-10-CM | POA: Diagnosis not present

## 2017-02-10 DIAGNOSIS — S51012D Laceration without foreign body of left elbow, subsequent encounter: Secondary | ICD-10-CM | POA: Diagnosis not present

## 2017-02-10 DIAGNOSIS — S065X0D Traumatic subdural hemorrhage without loss of consciousness, subsequent encounter: Secondary | ICD-10-CM | POA: Diagnosis not present

## 2017-02-10 DIAGNOSIS — I6529 Occlusion and stenosis of unspecified carotid artery: Secondary | ICD-10-CM | POA: Diagnosis not present

## 2017-02-10 DIAGNOSIS — I739 Peripheral vascular disease, unspecified: Secondary | ICD-10-CM | POA: Diagnosis not present

## 2017-02-10 DIAGNOSIS — S81811D Laceration without foreign body, right lower leg, subsequent encounter: Secondary | ICD-10-CM | POA: Diagnosis not present

## 2017-02-10 DIAGNOSIS — F0390 Unspecified dementia without behavioral disturbance: Secondary | ICD-10-CM | POA: Diagnosis not present

## 2017-02-11 DIAGNOSIS — F0390 Unspecified dementia without behavioral disturbance: Secondary | ICD-10-CM | POA: Diagnosis not present

## 2017-02-11 DIAGNOSIS — I739 Peripheral vascular disease, unspecified: Secondary | ICD-10-CM | POA: Diagnosis not present

## 2017-02-11 DIAGNOSIS — S51012D Laceration without foreign body of left elbow, subsequent encounter: Secondary | ICD-10-CM | POA: Diagnosis not present

## 2017-02-11 DIAGNOSIS — I6529 Occlusion and stenosis of unspecified carotid artery: Secondary | ICD-10-CM | POA: Diagnosis not present

## 2017-02-11 DIAGNOSIS — S81811D Laceration without foreign body, right lower leg, subsequent encounter: Secondary | ICD-10-CM | POA: Diagnosis not present

## 2017-02-11 DIAGNOSIS — S065X0D Traumatic subdural hemorrhage without loss of consciousness, subsequent encounter: Secondary | ICD-10-CM | POA: Diagnosis not present

## 2017-02-14 ENCOUNTER — Ambulatory Visit: Payer: Medicare Other | Admitting: Podiatry

## 2017-02-14 DIAGNOSIS — I739 Peripheral vascular disease, unspecified: Secondary | ICD-10-CM | POA: Diagnosis not present

## 2017-02-14 DIAGNOSIS — I6529 Occlusion and stenosis of unspecified carotid artery: Secondary | ICD-10-CM | POA: Diagnosis not present

## 2017-02-14 DIAGNOSIS — F0390 Unspecified dementia without behavioral disturbance: Secondary | ICD-10-CM | POA: Diagnosis not present

## 2017-02-14 DIAGNOSIS — S065X0D Traumatic subdural hemorrhage without loss of consciousness, subsequent encounter: Secondary | ICD-10-CM | POA: Diagnosis not present

## 2017-02-14 DIAGNOSIS — S81811D Laceration without foreign body, right lower leg, subsequent encounter: Secondary | ICD-10-CM | POA: Diagnosis not present

## 2017-02-14 DIAGNOSIS — S51012D Laceration without foreign body of left elbow, subsequent encounter: Secondary | ICD-10-CM | POA: Diagnosis not present

## 2017-02-15 DIAGNOSIS — S065X0D Traumatic subdural hemorrhage without loss of consciousness, subsequent encounter: Secondary | ICD-10-CM | POA: Diagnosis not present

## 2017-02-15 DIAGNOSIS — S81811D Laceration without foreign body, right lower leg, subsequent encounter: Secondary | ICD-10-CM | POA: Diagnosis not present

## 2017-02-15 DIAGNOSIS — I6529 Occlusion and stenosis of unspecified carotid artery: Secondary | ICD-10-CM | POA: Diagnosis not present

## 2017-02-15 DIAGNOSIS — F0390 Unspecified dementia without behavioral disturbance: Secondary | ICD-10-CM | POA: Diagnosis not present

## 2017-02-15 DIAGNOSIS — I739 Peripheral vascular disease, unspecified: Secondary | ICD-10-CM | POA: Diagnosis not present

## 2017-02-15 DIAGNOSIS — S51012D Laceration without foreign body of left elbow, subsequent encounter: Secondary | ICD-10-CM | POA: Diagnosis not present

## 2017-02-16 DIAGNOSIS — I739 Peripheral vascular disease, unspecified: Secondary | ICD-10-CM | POA: Diagnosis not present

## 2017-02-16 DIAGNOSIS — S51012D Laceration without foreign body of left elbow, subsequent encounter: Secondary | ICD-10-CM | POA: Diagnosis not present

## 2017-02-16 DIAGNOSIS — S81811D Laceration without foreign body, right lower leg, subsequent encounter: Secondary | ICD-10-CM | POA: Diagnosis not present

## 2017-02-16 DIAGNOSIS — S065X0D Traumatic subdural hemorrhage without loss of consciousness, subsequent encounter: Secondary | ICD-10-CM | POA: Diagnosis not present

## 2017-02-16 DIAGNOSIS — F0390 Unspecified dementia without behavioral disturbance: Secondary | ICD-10-CM | POA: Diagnosis not present

## 2017-02-16 DIAGNOSIS — I6529 Occlusion and stenosis of unspecified carotid artery: Secondary | ICD-10-CM | POA: Diagnosis not present

## 2017-02-17 DIAGNOSIS — S81811D Laceration without foreign body, right lower leg, subsequent encounter: Secondary | ICD-10-CM | POA: Diagnosis not present

## 2017-02-17 DIAGNOSIS — I739 Peripheral vascular disease, unspecified: Secondary | ICD-10-CM | POA: Diagnosis not present

## 2017-02-17 DIAGNOSIS — S065X0D Traumatic subdural hemorrhage without loss of consciousness, subsequent encounter: Secondary | ICD-10-CM | POA: Diagnosis not present

## 2017-02-17 DIAGNOSIS — S51012D Laceration without foreign body of left elbow, subsequent encounter: Secondary | ICD-10-CM | POA: Diagnosis not present

## 2017-02-17 DIAGNOSIS — I6529 Occlusion and stenosis of unspecified carotid artery: Secondary | ICD-10-CM | POA: Diagnosis not present

## 2017-02-17 DIAGNOSIS — F0390 Unspecified dementia without behavioral disturbance: Secondary | ICD-10-CM | POA: Diagnosis not present

## 2017-02-18 DIAGNOSIS — I739 Peripheral vascular disease, unspecified: Secondary | ICD-10-CM | POA: Diagnosis not present

## 2017-02-18 DIAGNOSIS — S51012D Laceration without foreign body of left elbow, subsequent encounter: Secondary | ICD-10-CM | POA: Diagnosis not present

## 2017-02-18 DIAGNOSIS — S81811D Laceration without foreign body, right lower leg, subsequent encounter: Secondary | ICD-10-CM | POA: Diagnosis not present

## 2017-02-18 DIAGNOSIS — S065X0D Traumatic subdural hemorrhage without loss of consciousness, subsequent encounter: Secondary | ICD-10-CM | POA: Diagnosis not present

## 2017-02-18 DIAGNOSIS — F0390 Unspecified dementia without behavioral disturbance: Secondary | ICD-10-CM | POA: Diagnosis not present

## 2017-02-18 DIAGNOSIS — I6529 Occlusion and stenosis of unspecified carotid artery: Secondary | ICD-10-CM | POA: Diagnosis not present

## 2017-02-19 DIAGNOSIS — S065X0D Traumatic subdural hemorrhage without loss of consciousness, subsequent encounter: Secondary | ICD-10-CM | POA: Diagnosis not present

## 2017-02-19 DIAGNOSIS — S51012D Laceration without foreign body of left elbow, subsequent encounter: Secondary | ICD-10-CM | POA: Diagnosis not present

## 2017-02-19 DIAGNOSIS — S81811D Laceration without foreign body, right lower leg, subsequent encounter: Secondary | ICD-10-CM | POA: Diagnosis not present

## 2017-02-19 DIAGNOSIS — F0390 Unspecified dementia without behavioral disturbance: Secondary | ICD-10-CM | POA: Diagnosis not present

## 2017-02-19 DIAGNOSIS — I739 Peripheral vascular disease, unspecified: Secondary | ICD-10-CM | POA: Diagnosis not present

## 2017-02-19 DIAGNOSIS — I6529 Occlusion and stenosis of unspecified carotid artery: Secondary | ICD-10-CM | POA: Diagnosis not present

## 2017-02-20 DIAGNOSIS — I739 Peripheral vascular disease, unspecified: Secondary | ICD-10-CM | POA: Diagnosis not present

## 2017-02-20 DIAGNOSIS — S51012D Laceration without foreign body of left elbow, subsequent encounter: Secondary | ICD-10-CM | POA: Diagnosis not present

## 2017-02-20 DIAGNOSIS — F0390 Unspecified dementia without behavioral disturbance: Secondary | ICD-10-CM | POA: Diagnosis not present

## 2017-02-20 DIAGNOSIS — S065X0D Traumatic subdural hemorrhage without loss of consciousness, subsequent encounter: Secondary | ICD-10-CM | POA: Diagnosis not present

## 2017-02-20 DIAGNOSIS — I6529 Occlusion and stenosis of unspecified carotid artery: Secondary | ICD-10-CM | POA: Diagnosis not present

## 2017-02-20 DIAGNOSIS — S81811D Laceration without foreign body, right lower leg, subsequent encounter: Secondary | ICD-10-CM | POA: Diagnosis not present

## 2017-02-21 DIAGNOSIS — S51012D Laceration without foreign body of left elbow, subsequent encounter: Secondary | ICD-10-CM | POA: Diagnosis not present

## 2017-02-21 DIAGNOSIS — S81811D Laceration without foreign body, right lower leg, subsequent encounter: Secondary | ICD-10-CM | POA: Diagnosis not present

## 2017-02-21 DIAGNOSIS — I739 Peripheral vascular disease, unspecified: Secondary | ICD-10-CM | POA: Diagnosis not present

## 2017-02-21 DIAGNOSIS — S065X0D Traumatic subdural hemorrhage without loss of consciousness, subsequent encounter: Secondary | ICD-10-CM | POA: Diagnosis not present

## 2017-02-21 DIAGNOSIS — I6529 Occlusion and stenosis of unspecified carotid artery: Secondary | ICD-10-CM | POA: Diagnosis not present

## 2017-02-21 DIAGNOSIS — F0390 Unspecified dementia without behavioral disturbance: Secondary | ICD-10-CM | POA: Diagnosis not present

## 2017-02-22 DIAGNOSIS — S81811D Laceration without foreign body, right lower leg, subsequent encounter: Secondary | ICD-10-CM | POA: Diagnosis not present

## 2017-02-22 DIAGNOSIS — I739 Peripheral vascular disease, unspecified: Secondary | ICD-10-CM | POA: Diagnosis not present

## 2017-02-22 DIAGNOSIS — I6529 Occlusion and stenosis of unspecified carotid artery: Secondary | ICD-10-CM | POA: Diagnosis not present

## 2017-02-22 DIAGNOSIS — S51012D Laceration without foreign body of left elbow, subsequent encounter: Secondary | ICD-10-CM | POA: Diagnosis not present

## 2017-02-22 DIAGNOSIS — F0390 Unspecified dementia without behavioral disturbance: Secondary | ICD-10-CM | POA: Diagnosis not present

## 2017-02-22 DIAGNOSIS — S065X0D Traumatic subdural hemorrhage without loss of consciousness, subsequent encounter: Secondary | ICD-10-CM | POA: Diagnosis not present

## 2017-02-23 DIAGNOSIS — I739 Peripheral vascular disease, unspecified: Secondary | ICD-10-CM | POA: Diagnosis not present

## 2017-02-23 DIAGNOSIS — S065X0D Traumatic subdural hemorrhage without loss of consciousness, subsequent encounter: Secondary | ICD-10-CM | POA: Diagnosis not present

## 2017-02-23 DIAGNOSIS — I6529 Occlusion and stenosis of unspecified carotid artery: Secondary | ICD-10-CM | POA: Diagnosis not present

## 2017-02-23 DIAGNOSIS — F0390 Unspecified dementia without behavioral disturbance: Secondary | ICD-10-CM | POA: Diagnosis not present

## 2017-02-23 DIAGNOSIS — S51012D Laceration without foreign body of left elbow, subsequent encounter: Secondary | ICD-10-CM | POA: Diagnosis not present

## 2017-02-23 DIAGNOSIS — S81811D Laceration without foreign body, right lower leg, subsequent encounter: Secondary | ICD-10-CM | POA: Diagnosis not present

## 2017-02-24 DIAGNOSIS — S065X0D Traumatic subdural hemorrhage without loss of consciousness, subsequent encounter: Secondary | ICD-10-CM | POA: Diagnosis not present

## 2017-02-24 DIAGNOSIS — S81811D Laceration without foreign body, right lower leg, subsequent encounter: Secondary | ICD-10-CM | POA: Diagnosis not present

## 2017-02-24 DIAGNOSIS — I739 Peripheral vascular disease, unspecified: Secondary | ICD-10-CM | POA: Diagnosis not present

## 2017-02-24 DIAGNOSIS — S51012D Laceration without foreign body of left elbow, subsequent encounter: Secondary | ICD-10-CM | POA: Diagnosis not present

## 2017-02-24 DIAGNOSIS — F0390 Unspecified dementia without behavioral disturbance: Secondary | ICD-10-CM | POA: Diagnosis not present

## 2017-02-24 DIAGNOSIS — I6529 Occlusion and stenosis of unspecified carotid artery: Secondary | ICD-10-CM | POA: Diagnosis not present

## 2017-02-25 DIAGNOSIS — S065X0D Traumatic subdural hemorrhage without loss of consciousness, subsequent encounter: Secondary | ICD-10-CM | POA: Diagnosis not present

## 2017-02-25 DIAGNOSIS — I739 Peripheral vascular disease, unspecified: Secondary | ICD-10-CM | POA: Diagnosis not present

## 2017-02-25 DIAGNOSIS — F0390 Unspecified dementia without behavioral disturbance: Secondary | ICD-10-CM | POA: Diagnosis not present

## 2017-02-25 DIAGNOSIS — S51012D Laceration without foreign body of left elbow, subsequent encounter: Secondary | ICD-10-CM | POA: Diagnosis not present

## 2017-02-25 DIAGNOSIS — S81811D Laceration without foreign body, right lower leg, subsequent encounter: Secondary | ICD-10-CM | POA: Diagnosis not present

## 2017-02-25 DIAGNOSIS — I6529 Occlusion and stenosis of unspecified carotid artery: Secondary | ICD-10-CM | POA: Diagnosis not present

## 2017-02-26 DIAGNOSIS — R634 Abnormal weight loss: Secondary | ICD-10-CM | POA: Diagnosis not present

## 2017-02-26 DIAGNOSIS — K5909 Other constipation: Secondary | ICD-10-CM | POA: Diagnosis not present

## 2017-02-26 DIAGNOSIS — S51012D Laceration without foreign body of left elbow, subsequent encounter: Secondary | ICD-10-CM | POA: Diagnosis not present

## 2017-02-26 DIAGNOSIS — S81811D Laceration without foreign body, right lower leg, subsequent encounter: Secondary | ICD-10-CM | POA: Diagnosis not present

## 2017-02-26 DIAGNOSIS — M1991 Primary osteoarthritis, unspecified site: Secondary | ICD-10-CM | POA: Diagnosis not present

## 2017-02-26 DIAGNOSIS — I10 Essential (primary) hypertension: Secondary | ICD-10-CM | POA: Diagnosis not present

## 2017-02-26 DIAGNOSIS — E785 Hyperlipidemia, unspecified: Secondary | ICD-10-CM | POA: Diagnosis not present

## 2017-02-26 DIAGNOSIS — I6529 Occlusion and stenosis of unspecified carotid artery: Secondary | ICD-10-CM | POA: Diagnosis not present

## 2017-02-26 DIAGNOSIS — I739 Peripheral vascular disease, unspecified: Secondary | ICD-10-CM | POA: Diagnosis not present

## 2017-02-26 DIAGNOSIS — F0390 Unspecified dementia without behavioral disturbance: Secondary | ICD-10-CM | POA: Diagnosis not present

## 2017-02-26 DIAGNOSIS — S065X0D Traumatic subdural hemorrhage without loss of consciousness, subsequent encounter: Secondary | ICD-10-CM | POA: Diagnosis not present

## 2017-02-27 DIAGNOSIS — Z23 Encounter for immunization: Secondary | ICD-10-CM | POA: Diagnosis not present

## 2017-02-28 DIAGNOSIS — F0390 Unspecified dementia without behavioral disturbance: Secondary | ICD-10-CM | POA: Diagnosis not present

## 2017-02-28 DIAGNOSIS — I6529 Occlusion and stenosis of unspecified carotid artery: Secondary | ICD-10-CM | POA: Diagnosis not present

## 2017-02-28 DIAGNOSIS — I739 Peripheral vascular disease, unspecified: Secondary | ICD-10-CM | POA: Diagnosis not present

## 2017-02-28 DIAGNOSIS — S81811D Laceration without foreign body, right lower leg, subsequent encounter: Secondary | ICD-10-CM | POA: Diagnosis not present

## 2017-02-28 DIAGNOSIS — S065X0D Traumatic subdural hemorrhage without loss of consciousness, subsequent encounter: Secondary | ICD-10-CM | POA: Diagnosis not present

## 2017-02-28 DIAGNOSIS — S51012D Laceration without foreign body of left elbow, subsequent encounter: Secondary | ICD-10-CM | POA: Diagnosis not present

## 2017-03-01 DIAGNOSIS — S065X0D Traumatic subdural hemorrhage without loss of consciousness, subsequent encounter: Secondary | ICD-10-CM | POA: Diagnosis not present

## 2017-03-01 DIAGNOSIS — I739 Peripheral vascular disease, unspecified: Secondary | ICD-10-CM | POA: Diagnosis not present

## 2017-03-01 DIAGNOSIS — I6529 Occlusion and stenosis of unspecified carotid artery: Secondary | ICD-10-CM | POA: Diagnosis not present

## 2017-03-01 DIAGNOSIS — S51012D Laceration without foreign body of left elbow, subsequent encounter: Secondary | ICD-10-CM | POA: Diagnosis not present

## 2017-03-01 DIAGNOSIS — S81811D Laceration without foreign body, right lower leg, subsequent encounter: Secondary | ICD-10-CM | POA: Diagnosis not present

## 2017-03-01 DIAGNOSIS — F0390 Unspecified dementia without behavioral disturbance: Secondary | ICD-10-CM | POA: Diagnosis not present

## 2017-03-02 DIAGNOSIS — S065X0D Traumatic subdural hemorrhage without loss of consciousness, subsequent encounter: Secondary | ICD-10-CM | POA: Diagnosis not present

## 2017-03-02 DIAGNOSIS — F0390 Unspecified dementia without behavioral disturbance: Secondary | ICD-10-CM | POA: Diagnosis not present

## 2017-03-02 DIAGNOSIS — S81811D Laceration without foreign body, right lower leg, subsequent encounter: Secondary | ICD-10-CM | POA: Diagnosis not present

## 2017-03-02 DIAGNOSIS — I6529 Occlusion and stenosis of unspecified carotid artery: Secondary | ICD-10-CM | POA: Diagnosis not present

## 2017-03-02 DIAGNOSIS — I739 Peripheral vascular disease, unspecified: Secondary | ICD-10-CM | POA: Diagnosis not present

## 2017-03-02 DIAGNOSIS — S51012D Laceration without foreign body of left elbow, subsequent encounter: Secondary | ICD-10-CM | POA: Diagnosis not present

## 2017-03-03 DIAGNOSIS — S81811D Laceration without foreign body, right lower leg, subsequent encounter: Secondary | ICD-10-CM | POA: Diagnosis not present

## 2017-03-03 DIAGNOSIS — I6529 Occlusion and stenosis of unspecified carotid artery: Secondary | ICD-10-CM | POA: Diagnosis not present

## 2017-03-03 DIAGNOSIS — S51012D Laceration without foreign body of left elbow, subsequent encounter: Secondary | ICD-10-CM | POA: Diagnosis not present

## 2017-03-03 DIAGNOSIS — F0390 Unspecified dementia without behavioral disturbance: Secondary | ICD-10-CM | POA: Diagnosis not present

## 2017-03-03 DIAGNOSIS — S065X0D Traumatic subdural hemorrhage without loss of consciousness, subsequent encounter: Secondary | ICD-10-CM | POA: Diagnosis not present

## 2017-03-03 DIAGNOSIS — Z79899 Other long term (current) drug therapy: Secondary | ICD-10-CM | POA: Diagnosis not present

## 2017-03-03 DIAGNOSIS — R636 Underweight: Secondary | ICD-10-CM | POA: Diagnosis not present

## 2017-03-03 DIAGNOSIS — G308 Other Alzheimer's disease: Secondary | ICD-10-CM | POA: Diagnosis not present

## 2017-03-03 DIAGNOSIS — I739 Peripheral vascular disease, unspecified: Secondary | ICD-10-CM | POA: Diagnosis not present

## 2017-03-03 DIAGNOSIS — I10 Essential (primary) hypertension: Secondary | ICD-10-CM | POA: Diagnosis not present

## 2017-03-03 DIAGNOSIS — M1990 Unspecified osteoarthritis, unspecified site: Secondary | ICD-10-CM | POA: Diagnosis not present

## 2017-03-04 DIAGNOSIS — S51012D Laceration without foreign body of left elbow, subsequent encounter: Secondary | ICD-10-CM | POA: Diagnosis not present

## 2017-03-04 DIAGNOSIS — I739 Peripheral vascular disease, unspecified: Secondary | ICD-10-CM | POA: Diagnosis not present

## 2017-03-04 DIAGNOSIS — I6529 Occlusion and stenosis of unspecified carotid artery: Secondary | ICD-10-CM | POA: Diagnosis not present

## 2017-03-04 DIAGNOSIS — S065X0D Traumatic subdural hemorrhage without loss of consciousness, subsequent encounter: Secondary | ICD-10-CM | POA: Diagnosis not present

## 2017-03-04 DIAGNOSIS — S81811D Laceration without foreign body, right lower leg, subsequent encounter: Secondary | ICD-10-CM | POA: Diagnosis not present

## 2017-03-04 DIAGNOSIS — F0390 Unspecified dementia without behavioral disturbance: Secondary | ICD-10-CM | POA: Diagnosis not present

## 2017-03-07 DIAGNOSIS — S51012D Laceration without foreign body of left elbow, subsequent encounter: Secondary | ICD-10-CM | POA: Diagnosis not present

## 2017-03-07 DIAGNOSIS — F0390 Unspecified dementia without behavioral disturbance: Secondary | ICD-10-CM | POA: Diagnosis not present

## 2017-03-07 DIAGNOSIS — I6529 Occlusion and stenosis of unspecified carotid artery: Secondary | ICD-10-CM | POA: Diagnosis not present

## 2017-03-07 DIAGNOSIS — I739 Peripheral vascular disease, unspecified: Secondary | ICD-10-CM | POA: Diagnosis not present

## 2017-03-07 DIAGNOSIS — S81811D Laceration without foreign body, right lower leg, subsequent encounter: Secondary | ICD-10-CM | POA: Diagnosis not present

## 2017-03-07 DIAGNOSIS — S065X0D Traumatic subdural hemorrhage without loss of consciousness, subsequent encounter: Secondary | ICD-10-CM | POA: Diagnosis not present

## 2017-03-08 DIAGNOSIS — S065X0D Traumatic subdural hemorrhage without loss of consciousness, subsequent encounter: Secondary | ICD-10-CM | POA: Diagnosis not present

## 2017-03-08 DIAGNOSIS — F0390 Unspecified dementia without behavioral disturbance: Secondary | ICD-10-CM | POA: Diagnosis not present

## 2017-03-08 DIAGNOSIS — I739 Peripheral vascular disease, unspecified: Secondary | ICD-10-CM | POA: Diagnosis not present

## 2017-03-08 DIAGNOSIS — S81811D Laceration without foreign body, right lower leg, subsequent encounter: Secondary | ICD-10-CM | POA: Diagnosis not present

## 2017-03-08 DIAGNOSIS — I6529 Occlusion and stenosis of unspecified carotid artery: Secondary | ICD-10-CM | POA: Diagnosis not present

## 2017-03-08 DIAGNOSIS — S51012D Laceration without foreign body of left elbow, subsequent encounter: Secondary | ICD-10-CM | POA: Diagnosis not present

## 2017-03-09 DIAGNOSIS — S51012D Laceration without foreign body of left elbow, subsequent encounter: Secondary | ICD-10-CM | POA: Diagnosis not present

## 2017-03-09 DIAGNOSIS — F0390 Unspecified dementia without behavioral disturbance: Secondary | ICD-10-CM | POA: Diagnosis not present

## 2017-03-09 DIAGNOSIS — I739 Peripheral vascular disease, unspecified: Secondary | ICD-10-CM | POA: Diagnosis not present

## 2017-03-09 DIAGNOSIS — I6529 Occlusion and stenosis of unspecified carotid artery: Secondary | ICD-10-CM | POA: Diagnosis not present

## 2017-03-09 DIAGNOSIS — S81811D Laceration without foreign body, right lower leg, subsequent encounter: Secondary | ICD-10-CM | POA: Diagnosis not present

## 2017-03-09 DIAGNOSIS — S065X0D Traumatic subdural hemorrhage without loss of consciousness, subsequent encounter: Secondary | ICD-10-CM | POA: Diagnosis not present

## 2017-03-10 DIAGNOSIS — S81811D Laceration without foreign body, right lower leg, subsequent encounter: Secondary | ICD-10-CM | POA: Diagnosis not present

## 2017-03-10 DIAGNOSIS — F0390 Unspecified dementia without behavioral disturbance: Secondary | ICD-10-CM | POA: Diagnosis not present

## 2017-03-10 DIAGNOSIS — I739 Peripheral vascular disease, unspecified: Secondary | ICD-10-CM | POA: Diagnosis not present

## 2017-03-10 DIAGNOSIS — I6529 Occlusion and stenosis of unspecified carotid artery: Secondary | ICD-10-CM | POA: Diagnosis not present

## 2017-03-10 DIAGNOSIS — M1990 Unspecified osteoarthritis, unspecified site: Secondary | ICD-10-CM | POA: Diagnosis not present

## 2017-03-10 DIAGNOSIS — I10 Essential (primary) hypertension: Secondary | ICD-10-CM | POA: Diagnosis not present

## 2017-03-10 DIAGNOSIS — S065X0D Traumatic subdural hemorrhage without loss of consciousness, subsequent encounter: Secondary | ICD-10-CM | POA: Diagnosis not present

## 2017-03-10 DIAGNOSIS — S51012D Laceration without foreign body of left elbow, subsequent encounter: Secondary | ICD-10-CM | POA: Diagnosis not present

## 2017-03-11 DIAGNOSIS — S065X0D Traumatic subdural hemorrhage without loss of consciousness, subsequent encounter: Secondary | ICD-10-CM | POA: Diagnosis not present

## 2017-03-11 DIAGNOSIS — F0390 Unspecified dementia without behavioral disturbance: Secondary | ICD-10-CM | POA: Diagnosis not present

## 2017-03-11 DIAGNOSIS — I739 Peripheral vascular disease, unspecified: Secondary | ICD-10-CM | POA: Diagnosis not present

## 2017-03-11 DIAGNOSIS — I6529 Occlusion and stenosis of unspecified carotid artery: Secondary | ICD-10-CM | POA: Diagnosis not present

## 2017-03-11 DIAGNOSIS — S51012D Laceration without foreign body of left elbow, subsequent encounter: Secondary | ICD-10-CM | POA: Diagnosis not present

## 2017-03-11 DIAGNOSIS — S81811D Laceration without foreign body, right lower leg, subsequent encounter: Secondary | ICD-10-CM | POA: Diagnosis not present

## 2017-03-14 DIAGNOSIS — S51012D Laceration without foreign body of left elbow, subsequent encounter: Secondary | ICD-10-CM | POA: Diagnosis not present

## 2017-03-14 DIAGNOSIS — F0390 Unspecified dementia without behavioral disturbance: Secondary | ICD-10-CM | POA: Diagnosis not present

## 2017-03-14 DIAGNOSIS — I739 Peripheral vascular disease, unspecified: Secondary | ICD-10-CM | POA: Diagnosis not present

## 2017-03-14 DIAGNOSIS — S065X0D Traumatic subdural hemorrhage without loss of consciousness, subsequent encounter: Secondary | ICD-10-CM | POA: Diagnosis not present

## 2017-03-14 DIAGNOSIS — S81811D Laceration without foreign body, right lower leg, subsequent encounter: Secondary | ICD-10-CM | POA: Diagnosis not present

## 2017-03-14 DIAGNOSIS — I6529 Occlusion and stenosis of unspecified carotid artery: Secondary | ICD-10-CM | POA: Diagnosis not present

## 2017-03-15 DIAGNOSIS — S065X0D Traumatic subdural hemorrhage without loss of consciousness, subsequent encounter: Secondary | ICD-10-CM | POA: Diagnosis not present

## 2017-03-15 DIAGNOSIS — S81811D Laceration without foreign body, right lower leg, subsequent encounter: Secondary | ICD-10-CM | POA: Diagnosis not present

## 2017-03-15 DIAGNOSIS — S51012D Laceration without foreign body of left elbow, subsequent encounter: Secondary | ICD-10-CM | POA: Diagnosis not present

## 2017-03-15 DIAGNOSIS — I739 Peripheral vascular disease, unspecified: Secondary | ICD-10-CM | POA: Diagnosis not present

## 2017-03-15 DIAGNOSIS — I6529 Occlusion and stenosis of unspecified carotid artery: Secondary | ICD-10-CM | POA: Diagnosis not present

## 2017-03-15 DIAGNOSIS — F0390 Unspecified dementia without behavioral disturbance: Secondary | ICD-10-CM | POA: Diagnosis not present

## 2017-03-16 DIAGNOSIS — S81811D Laceration without foreign body, right lower leg, subsequent encounter: Secondary | ICD-10-CM | POA: Diagnosis not present

## 2017-03-16 DIAGNOSIS — S065X0D Traumatic subdural hemorrhage without loss of consciousness, subsequent encounter: Secondary | ICD-10-CM | POA: Diagnosis not present

## 2017-03-16 DIAGNOSIS — I739 Peripheral vascular disease, unspecified: Secondary | ICD-10-CM | POA: Diagnosis not present

## 2017-03-16 DIAGNOSIS — I6529 Occlusion and stenosis of unspecified carotid artery: Secondary | ICD-10-CM | POA: Diagnosis not present

## 2017-03-16 DIAGNOSIS — F0390 Unspecified dementia without behavioral disturbance: Secondary | ICD-10-CM | POA: Diagnosis not present

## 2017-03-16 DIAGNOSIS — S51012D Laceration without foreign body of left elbow, subsequent encounter: Secondary | ICD-10-CM | POA: Diagnosis not present

## 2017-03-17 DIAGNOSIS — I6529 Occlusion and stenosis of unspecified carotid artery: Secondary | ICD-10-CM | POA: Diagnosis not present

## 2017-03-17 DIAGNOSIS — I739 Peripheral vascular disease, unspecified: Secondary | ICD-10-CM | POA: Diagnosis not present

## 2017-03-17 DIAGNOSIS — S51012D Laceration without foreign body of left elbow, subsequent encounter: Secondary | ICD-10-CM | POA: Diagnosis not present

## 2017-03-17 DIAGNOSIS — S065X0D Traumatic subdural hemorrhage without loss of consciousness, subsequent encounter: Secondary | ICD-10-CM | POA: Diagnosis not present

## 2017-03-17 DIAGNOSIS — S81811D Laceration without foreign body, right lower leg, subsequent encounter: Secondary | ICD-10-CM | POA: Diagnosis not present

## 2017-03-17 DIAGNOSIS — F0390 Unspecified dementia without behavioral disturbance: Secondary | ICD-10-CM | POA: Diagnosis not present

## 2017-03-18 DIAGNOSIS — I6529 Occlusion and stenosis of unspecified carotid artery: Secondary | ICD-10-CM | POA: Diagnosis not present

## 2017-03-18 DIAGNOSIS — S065X0D Traumatic subdural hemorrhage without loss of consciousness, subsequent encounter: Secondary | ICD-10-CM | POA: Diagnosis not present

## 2017-03-18 DIAGNOSIS — F0390 Unspecified dementia without behavioral disturbance: Secondary | ICD-10-CM | POA: Diagnosis not present

## 2017-03-18 DIAGNOSIS — S81811D Laceration without foreign body, right lower leg, subsequent encounter: Secondary | ICD-10-CM | POA: Diagnosis not present

## 2017-03-18 DIAGNOSIS — I739 Peripheral vascular disease, unspecified: Secondary | ICD-10-CM | POA: Diagnosis not present

## 2017-03-18 DIAGNOSIS — S51012D Laceration without foreign body of left elbow, subsequent encounter: Secondary | ICD-10-CM | POA: Diagnosis not present

## 2017-03-21 DIAGNOSIS — F0390 Unspecified dementia without behavioral disturbance: Secondary | ICD-10-CM | POA: Diagnosis not present

## 2017-03-21 DIAGNOSIS — S065X0D Traumatic subdural hemorrhage without loss of consciousness, subsequent encounter: Secondary | ICD-10-CM | POA: Diagnosis not present

## 2017-03-21 DIAGNOSIS — I6529 Occlusion and stenosis of unspecified carotid artery: Secondary | ICD-10-CM | POA: Diagnosis not present

## 2017-03-21 DIAGNOSIS — S51012D Laceration without foreign body of left elbow, subsequent encounter: Secondary | ICD-10-CM | POA: Diagnosis not present

## 2017-03-21 DIAGNOSIS — I739 Peripheral vascular disease, unspecified: Secondary | ICD-10-CM | POA: Diagnosis not present

## 2017-03-21 DIAGNOSIS — S81811D Laceration without foreign body, right lower leg, subsequent encounter: Secondary | ICD-10-CM | POA: Diagnosis not present

## 2017-03-22 DIAGNOSIS — F0390 Unspecified dementia without behavioral disturbance: Secondary | ICD-10-CM | POA: Diagnosis not present

## 2017-03-22 DIAGNOSIS — I6529 Occlusion and stenosis of unspecified carotid artery: Secondary | ICD-10-CM | POA: Diagnosis not present

## 2017-03-22 DIAGNOSIS — S51012D Laceration without foreign body of left elbow, subsequent encounter: Secondary | ICD-10-CM | POA: Diagnosis not present

## 2017-03-22 DIAGNOSIS — I739 Peripheral vascular disease, unspecified: Secondary | ICD-10-CM | POA: Diagnosis not present

## 2017-03-22 DIAGNOSIS — S065X0D Traumatic subdural hemorrhage without loss of consciousness, subsequent encounter: Secondary | ICD-10-CM | POA: Diagnosis not present

## 2017-03-22 DIAGNOSIS — S81811D Laceration without foreign body, right lower leg, subsequent encounter: Secondary | ICD-10-CM | POA: Diagnosis not present

## 2017-03-23 DIAGNOSIS — S51012D Laceration without foreign body of left elbow, subsequent encounter: Secondary | ICD-10-CM | POA: Diagnosis not present

## 2017-03-23 DIAGNOSIS — S81811D Laceration without foreign body, right lower leg, subsequent encounter: Secondary | ICD-10-CM | POA: Diagnosis not present

## 2017-03-23 DIAGNOSIS — I6529 Occlusion and stenosis of unspecified carotid artery: Secondary | ICD-10-CM | POA: Diagnosis not present

## 2017-03-23 DIAGNOSIS — F0390 Unspecified dementia without behavioral disturbance: Secondary | ICD-10-CM | POA: Diagnosis not present

## 2017-03-23 DIAGNOSIS — I739 Peripheral vascular disease, unspecified: Secondary | ICD-10-CM | POA: Diagnosis not present

## 2017-03-23 DIAGNOSIS — S065X0D Traumatic subdural hemorrhage without loss of consciousness, subsequent encounter: Secondary | ICD-10-CM | POA: Diagnosis not present

## 2017-03-24 DIAGNOSIS — F0390 Unspecified dementia without behavioral disturbance: Secondary | ICD-10-CM | POA: Diagnosis not present

## 2017-03-24 DIAGNOSIS — I6529 Occlusion and stenosis of unspecified carotid artery: Secondary | ICD-10-CM | POA: Diagnosis not present

## 2017-03-24 DIAGNOSIS — S51012D Laceration without foreign body of left elbow, subsequent encounter: Secondary | ICD-10-CM | POA: Diagnosis not present

## 2017-03-24 DIAGNOSIS — S065X0D Traumatic subdural hemorrhage without loss of consciousness, subsequent encounter: Secondary | ICD-10-CM | POA: Diagnosis not present

## 2017-03-24 DIAGNOSIS — S81811D Laceration without foreign body, right lower leg, subsequent encounter: Secondary | ICD-10-CM | POA: Diagnosis not present

## 2017-03-24 DIAGNOSIS — I739 Peripheral vascular disease, unspecified: Secondary | ICD-10-CM | POA: Diagnosis not present

## 2017-03-25 DIAGNOSIS — F0390 Unspecified dementia without behavioral disturbance: Secondary | ICD-10-CM | POA: Diagnosis not present

## 2017-03-25 DIAGNOSIS — S51012D Laceration without foreign body of left elbow, subsequent encounter: Secondary | ICD-10-CM | POA: Diagnosis not present

## 2017-03-25 DIAGNOSIS — S81811D Laceration without foreign body, right lower leg, subsequent encounter: Secondary | ICD-10-CM | POA: Diagnosis not present

## 2017-03-25 DIAGNOSIS — I6529 Occlusion and stenosis of unspecified carotid artery: Secondary | ICD-10-CM | POA: Diagnosis not present

## 2017-03-25 DIAGNOSIS — S065X0D Traumatic subdural hemorrhage without loss of consciousness, subsequent encounter: Secondary | ICD-10-CM | POA: Diagnosis not present

## 2017-03-25 DIAGNOSIS — I739 Peripheral vascular disease, unspecified: Secondary | ICD-10-CM | POA: Diagnosis not present

## 2017-03-28 DIAGNOSIS — S065X0D Traumatic subdural hemorrhage without loss of consciousness, subsequent encounter: Secondary | ICD-10-CM | POA: Diagnosis not present

## 2017-03-28 DIAGNOSIS — F0390 Unspecified dementia without behavioral disturbance: Secondary | ICD-10-CM | POA: Diagnosis not present

## 2017-03-28 DIAGNOSIS — S81811D Laceration without foreign body, right lower leg, subsequent encounter: Secondary | ICD-10-CM | POA: Diagnosis not present

## 2017-03-28 DIAGNOSIS — I739 Peripheral vascular disease, unspecified: Secondary | ICD-10-CM | POA: Diagnosis not present

## 2017-03-28 DIAGNOSIS — S51012D Laceration without foreign body of left elbow, subsequent encounter: Secondary | ICD-10-CM | POA: Diagnosis not present

## 2017-03-28 DIAGNOSIS — I6529 Occlusion and stenosis of unspecified carotid artery: Secondary | ICD-10-CM | POA: Diagnosis not present

## 2017-03-29 DIAGNOSIS — M1991 Primary osteoarthritis, unspecified site: Secondary | ICD-10-CM | POA: Diagnosis not present

## 2017-03-29 DIAGNOSIS — F0150 Vascular dementia without behavioral disturbance: Secondary | ICD-10-CM | POA: Diagnosis not present

## 2017-03-29 DIAGNOSIS — E785 Hyperlipidemia, unspecified: Secondary | ICD-10-CM | POA: Diagnosis not present

## 2017-03-29 DIAGNOSIS — I739 Peripheral vascular disease, unspecified: Secondary | ICD-10-CM | POA: Diagnosis not present

## 2017-03-29 DIAGNOSIS — I69318 Other symptoms and signs involving cognitive functions following cerebral infarction: Secondary | ICD-10-CM | POA: Diagnosis not present

## 2017-03-29 DIAGNOSIS — I10 Essential (primary) hypertension: Secondary | ICD-10-CM | POA: Diagnosis not present

## 2017-03-29 DIAGNOSIS — S065X0D Traumatic subdural hemorrhage without loss of consciousness, subsequent encounter: Secondary | ICD-10-CM | POA: Diagnosis not present

## 2017-03-29 DIAGNOSIS — R634 Abnormal weight loss: Secondary | ICD-10-CM | POA: Diagnosis not present

## 2017-03-29 DIAGNOSIS — K5909 Other constipation: Secondary | ICD-10-CM | POA: Diagnosis not present

## 2017-03-30 DIAGNOSIS — F0150 Vascular dementia without behavioral disturbance: Secondary | ICD-10-CM | POA: Diagnosis not present

## 2017-03-30 DIAGNOSIS — E785 Hyperlipidemia, unspecified: Secondary | ICD-10-CM | POA: Diagnosis not present

## 2017-03-30 DIAGNOSIS — S065X0D Traumatic subdural hemorrhage without loss of consciousness, subsequent encounter: Secondary | ICD-10-CM | POA: Diagnosis not present

## 2017-03-30 DIAGNOSIS — K5909 Other constipation: Secondary | ICD-10-CM | POA: Diagnosis not present

## 2017-03-30 DIAGNOSIS — I10 Essential (primary) hypertension: Secondary | ICD-10-CM | POA: Diagnosis not present

## 2017-03-30 DIAGNOSIS — I69318 Other symptoms and signs involving cognitive functions following cerebral infarction: Secondary | ICD-10-CM | POA: Diagnosis not present

## 2017-03-31 DIAGNOSIS — E785 Hyperlipidemia, unspecified: Secondary | ICD-10-CM | POA: Diagnosis not present

## 2017-03-31 DIAGNOSIS — H1089 Other conjunctivitis: Secondary | ICD-10-CM | POA: Diagnosis not present

## 2017-03-31 DIAGNOSIS — I69318 Other symptoms and signs involving cognitive functions following cerebral infarction: Secondary | ICD-10-CM | POA: Diagnosis not present

## 2017-03-31 DIAGNOSIS — F418 Other specified anxiety disorders: Secondary | ICD-10-CM | POA: Diagnosis not present

## 2017-03-31 DIAGNOSIS — S065X0D Traumatic subdural hemorrhage without loss of consciousness, subsequent encounter: Secondary | ICD-10-CM | POA: Diagnosis not present

## 2017-03-31 DIAGNOSIS — R636 Underweight: Secondary | ICD-10-CM | POA: Diagnosis not present

## 2017-03-31 DIAGNOSIS — I10 Essential (primary) hypertension: Secondary | ICD-10-CM | POA: Diagnosis not present

## 2017-03-31 DIAGNOSIS — Z79899 Other long term (current) drug therapy: Secondary | ICD-10-CM | POA: Diagnosis not present

## 2017-03-31 DIAGNOSIS — E7849 Other hyperlipidemia: Secondary | ICD-10-CM | POA: Diagnosis not present

## 2017-03-31 DIAGNOSIS — K5909 Other constipation: Secondary | ICD-10-CM | POA: Diagnosis not present

## 2017-03-31 DIAGNOSIS — F0150 Vascular dementia without behavioral disturbance: Secondary | ICD-10-CM | POA: Diagnosis not present

## 2017-04-01 DIAGNOSIS — I69318 Other symptoms and signs involving cognitive functions following cerebral infarction: Secondary | ICD-10-CM | POA: Diagnosis not present

## 2017-04-01 DIAGNOSIS — F0150 Vascular dementia without behavioral disturbance: Secondary | ICD-10-CM | POA: Diagnosis not present

## 2017-04-01 DIAGNOSIS — I10 Essential (primary) hypertension: Secondary | ICD-10-CM | POA: Diagnosis not present

## 2017-04-01 DIAGNOSIS — S065X0D Traumatic subdural hemorrhage without loss of consciousness, subsequent encounter: Secondary | ICD-10-CM | POA: Diagnosis not present

## 2017-04-01 DIAGNOSIS — K5909 Other constipation: Secondary | ICD-10-CM | POA: Diagnosis not present

## 2017-04-01 DIAGNOSIS — E785 Hyperlipidemia, unspecified: Secondary | ICD-10-CM | POA: Diagnosis not present

## 2017-04-04 DIAGNOSIS — S065X0D Traumatic subdural hemorrhage without loss of consciousness, subsequent encounter: Secondary | ICD-10-CM | POA: Diagnosis not present

## 2017-04-04 DIAGNOSIS — K5909 Other constipation: Secondary | ICD-10-CM | POA: Diagnosis not present

## 2017-04-04 DIAGNOSIS — F0150 Vascular dementia without behavioral disturbance: Secondary | ICD-10-CM | POA: Diagnosis not present

## 2017-04-04 DIAGNOSIS — E785 Hyperlipidemia, unspecified: Secondary | ICD-10-CM | POA: Diagnosis not present

## 2017-04-04 DIAGNOSIS — I69318 Other symptoms and signs involving cognitive functions following cerebral infarction: Secondary | ICD-10-CM | POA: Diagnosis not present

## 2017-04-04 DIAGNOSIS — I10 Essential (primary) hypertension: Secondary | ICD-10-CM | POA: Diagnosis not present

## 2017-04-05 DIAGNOSIS — I69318 Other symptoms and signs involving cognitive functions following cerebral infarction: Secondary | ICD-10-CM | POA: Diagnosis not present

## 2017-04-05 DIAGNOSIS — K5909 Other constipation: Secondary | ICD-10-CM | POA: Diagnosis not present

## 2017-04-05 DIAGNOSIS — S065X0D Traumatic subdural hemorrhage without loss of consciousness, subsequent encounter: Secondary | ICD-10-CM | POA: Diagnosis not present

## 2017-04-05 DIAGNOSIS — R2689 Other abnormalities of gait and mobility: Secondary | ICD-10-CM | POA: Diagnosis not present

## 2017-04-05 DIAGNOSIS — E785 Hyperlipidemia, unspecified: Secondary | ICD-10-CM | POA: Diagnosis not present

## 2017-04-05 DIAGNOSIS — R627 Adult failure to thrive: Secondary | ICD-10-CM | POA: Diagnosis not present

## 2017-04-05 DIAGNOSIS — G8929 Other chronic pain: Secondary | ICD-10-CM | POA: Diagnosis not present

## 2017-04-05 DIAGNOSIS — G308 Other Alzheimer's disease: Secondary | ICD-10-CM | POA: Diagnosis not present

## 2017-04-05 DIAGNOSIS — F0150 Vascular dementia without behavioral disturbance: Secondary | ICD-10-CM | POA: Diagnosis not present

## 2017-04-05 DIAGNOSIS — Z79899 Other long term (current) drug therapy: Secondary | ICD-10-CM | POA: Diagnosis not present

## 2017-04-05 DIAGNOSIS — I10 Essential (primary) hypertension: Secondary | ICD-10-CM | POA: Diagnosis not present

## 2017-04-05 DIAGNOSIS — S80811D Abrasion, right lower leg, subsequent encounter: Secondary | ICD-10-CM | POA: Diagnosis not present

## 2017-04-06 DIAGNOSIS — S065X0D Traumatic subdural hemorrhage without loss of consciousness, subsequent encounter: Secondary | ICD-10-CM | POA: Diagnosis not present

## 2017-04-06 DIAGNOSIS — E785 Hyperlipidemia, unspecified: Secondary | ICD-10-CM | POA: Diagnosis not present

## 2017-04-06 DIAGNOSIS — I10 Essential (primary) hypertension: Secondary | ICD-10-CM | POA: Diagnosis not present

## 2017-04-06 DIAGNOSIS — F0150 Vascular dementia without behavioral disturbance: Secondary | ICD-10-CM | POA: Diagnosis not present

## 2017-04-06 DIAGNOSIS — K5909 Other constipation: Secondary | ICD-10-CM | POA: Diagnosis not present

## 2017-04-06 DIAGNOSIS — I69318 Other symptoms and signs involving cognitive functions following cerebral infarction: Secondary | ICD-10-CM | POA: Diagnosis not present

## 2017-04-07 DIAGNOSIS — Z79899 Other long term (current) drug therapy: Secondary | ICD-10-CM | POA: Diagnosis not present

## 2017-04-07 DIAGNOSIS — R636 Underweight: Secondary | ICD-10-CM | POA: Diagnosis not present

## 2017-04-07 DIAGNOSIS — F0150 Vascular dementia without behavioral disturbance: Secondary | ICD-10-CM | POA: Diagnosis not present

## 2017-04-07 DIAGNOSIS — D518 Other vitamin B12 deficiency anemias: Secondary | ICD-10-CM | POA: Diagnosis not present

## 2017-04-07 DIAGNOSIS — I6389 Other cerebral infarction: Secondary | ICD-10-CM | POA: Diagnosis not present

## 2017-04-07 DIAGNOSIS — G308 Other Alzheimer's disease: Secondary | ICD-10-CM | POA: Diagnosis not present

## 2017-04-07 DIAGNOSIS — E785 Hyperlipidemia, unspecified: Secondary | ICD-10-CM | POA: Diagnosis not present

## 2017-04-07 DIAGNOSIS — S065X0D Traumatic subdural hemorrhage without loss of consciousness, subsequent encounter: Secondary | ICD-10-CM | POA: Diagnosis not present

## 2017-04-07 DIAGNOSIS — K5909 Other constipation: Secondary | ICD-10-CM | POA: Diagnosis not present

## 2017-04-07 DIAGNOSIS — I10 Essential (primary) hypertension: Secondary | ICD-10-CM | POA: Diagnosis not present

## 2017-04-07 DIAGNOSIS — I69318 Other symptoms and signs involving cognitive functions following cerebral infarction: Secondary | ICD-10-CM | POA: Diagnosis not present

## 2017-04-08 DIAGNOSIS — I10 Essential (primary) hypertension: Secondary | ICD-10-CM | POA: Diagnosis not present

## 2017-04-08 DIAGNOSIS — E785 Hyperlipidemia, unspecified: Secondary | ICD-10-CM | POA: Diagnosis not present

## 2017-04-08 DIAGNOSIS — K5909 Other constipation: Secondary | ICD-10-CM | POA: Diagnosis not present

## 2017-04-08 DIAGNOSIS — S065X0D Traumatic subdural hemorrhage without loss of consciousness, subsequent encounter: Secondary | ICD-10-CM | POA: Diagnosis not present

## 2017-04-08 DIAGNOSIS — F0150 Vascular dementia without behavioral disturbance: Secondary | ICD-10-CM | POA: Diagnosis not present

## 2017-04-08 DIAGNOSIS — I69318 Other symptoms and signs involving cognitive functions following cerebral infarction: Secondary | ICD-10-CM | POA: Diagnosis not present

## 2017-04-11 DIAGNOSIS — I10 Essential (primary) hypertension: Secondary | ICD-10-CM | POA: Diagnosis not present

## 2017-04-11 DIAGNOSIS — K5909 Other constipation: Secondary | ICD-10-CM | POA: Diagnosis not present

## 2017-04-11 DIAGNOSIS — F0150 Vascular dementia without behavioral disturbance: Secondary | ICD-10-CM | POA: Diagnosis not present

## 2017-04-11 DIAGNOSIS — E785 Hyperlipidemia, unspecified: Secondary | ICD-10-CM | POA: Diagnosis not present

## 2017-04-11 DIAGNOSIS — S065X0D Traumatic subdural hemorrhage without loss of consciousness, subsequent encounter: Secondary | ICD-10-CM | POA: Diagnosis not present

## 2017-04-11 DIAGNOSIS — I69318 Other symptoms and signs involving cognitive functions following cerebral infarction: Secondary | ICD-10-CM | POA: Diagnosis not present

## 2017-04-12 DIAGNOSIS — Z79899 Other long term (current) drug therapy: Secondary | ICD-10-CM | POA: Diagnosis not present

## 2017-04-12 DIAGNOSIS — I10 Essential (primary) hypertension: Secondary | ICD-10-CM | POA: Diagnosis not present

## 2017-04-12 DIAGNOSIS — I69318 Other symptoms and signs involving cognitive functions following cerebral infarction: Secondary | ICD-10-CM | POA: Diagnosis not present

## 2017-04-12 DIAGNOSIS — E785 Hyperlipidemia, unspecified: Secondary | ICD-10-CM | POA: Diagnosis not present

## 2017-04-12 DIAGNOSIS — F418 Other specified anxiety disorders: Secondary | ICD-10-CM | POA: Diagnosis not present

## 2017-04-12 DIAGNOSIS — R636 Underweight: Secondary | ICD-10-CM | POA: Diagnosis not present

## 2017-04-12 DIAGNOSIS — R0982 Postnasal drip: Secondary | ICD-10-CM | POA: Diagnosis not present

## 2017-04-12 DIAGNOSIS — S065X0D Traumatic subdural hemorrhage without loss of consciousness, subsequent encounter: Secondary | ICD-10-CM | POA: Diagnosis not present

## 2017-04-12 DIAGNOSIS — M150 Primary generalized (osteo)arthritis: Secondary | ICD-10-CM | POA: Diagnosis not present

## 2017-04-12 DIAGNOSIS — G308 Other Alzheimer's disease: Secondary | ICD-10-CM | POA: Diagnosis not present

## 2017-04-12 DIAGNOSIS — R627 Adult failure to thrive: Secondary | ICD-10-CM | POA: Diagnosis not present

## 2017-04-12 DIAGNOSIS — K5909 Other constipation: Secondary | ICD-10-CM | POA: Diagnosis not present

## 2017-04-12 DIAGNOSIS — R2689 Other abnormalities of gait and mobility: Secondary | ICD-10-CM | POA: Diagnosis not present

## 2017-04-12 DIAGNOSIS — F0150 Vascular dementia without behavioral disturbance: Secondary | ICD-10-CM | POA: Diagnosis not present

## 2017-04-13 DIAGNOSIS — I10 Essential (primary) hypertension: Secondary | ICD-10-CM | POA: Diagnosis not present

## 2017-04-13 DIAGNOSIS — F0150 Vascular dementia without behavioral disturbance: Secondary | ICD-10-CM | POA: Diagnosis not present

## 2017-04-13 DIAGNOSIS — I69318 Other symptoms and signs involving cognitive functions following cerebral infarction: Secondary | ICD-10-CM | POA: Diagnosis not present

## 2017-04-13 DIAGNOSIS — S065X0D Traumatic subdural hemorrhage without loss of consciousness, subsequent encounter: Secondary | ICD-10-CM | POA: Diagnosis not present

## 2017-04-13 DIAGNOSIS — E785 Hyperlipidemia, unspecified: Secondary | ICD-10-CM | POA: Diagnosis not present

## 2017-04-13 DIAGNOSIS — K5909 Other constipation: Secondary | ICD-10-CM | POA: Diagnosis not present

## 2017-04-14 ENCOUNTER — Telehealth: Payer: Self-pay | Admitting: Family Medicine

## 2017-04-14 DIAGNOSIS — F0150 Vascular dementia without behavioral disturbance: Secondary | ICD-10-CM | POA: Diagnosis not present

## 2017-04-14 DIAGNOSIS — I69318 Other symptoms and signs involving cognitive functions following cerebral infarction: Secondary | ICD-10-CM | POA: Diagnosis not present

## 2017-04-14 DIAGNOSIS — E7849 Other hyperlipidemia: Secondary | ICD-10-CM | POA: Diagnosis not present

## 2017-04-14 DIAGNOSIS — E785 Hyperlipidemia, unspecified: Secondary | ICD-10-CM | POA: Diagnosis not present

## 2017-04-14 DIAGNOSIS — K5909 Other constipation: Secondary | ICD-10-CM | POA: Diagnosis not present

## 2017-04-14 DIAGNOSIS — S065X0D Traumatic subdural hemorrhage without loss of consciousness, subsequent encounter: Secondary | ICD-10-CM | POA: Diagnosis not present

## 2017-04-14 DIAGNOSIS — J3089 Other allergic rhinitis: Secondary | ICD-10-CM | POA: Diagnosis not present

## 2017-04-14 DIAGNOSIS — S81801S Unspecified open wound, right lower leg, sequela: Secondary | ICD-10-CM | POA: Diagnosis not present

## 2017-04-14 DIAGNOSIS — R636 Underweight: Secondary | ICD-10-CM | POA: Diagnosis not present

## 2017-04-14 DIAGNOSIS — Z79899 Other long term (current) drug therapy: Secondary | ICD-10-CM | POA: Diagnosis not present

## 2017-04-14 DIAGNOSIS — R2689 Other abnormalities of gait and mobility: Secondary | ICD-10-CM | POA: Diagnosis not present

## 2017-04-14 DIAGNOSIS — I10 Essential (primary) hypertension: Secondary | ICD-10-CM | POA: Diagnosis not present

## 2017-04-14 NOTE — Telephone Encounter (Signed)
Continue to monitor her - please keep me updated if any changes

## 2017-04-14 NOTE — Telephone Encounter (Signed)
Copied from CRM [id number]. Topic: General - Other >> Apr 14, 2017  7:37 AM Crist InfanteHarrald, Kathy J wrote: Reason for CRM: [facility] around 4 am going to the bathroom.  Pt svcraped elbow and right knee. Pt seems to be ok. Sort of just dropped to the floor, not a hard fall.

## 2017-04-15 DIAGNOSIS — I69318 Other symptoms and signs involving cognitive functions following cerebral infarction: Secondary | ICD-10-CM | POA: Diagnosis not present

## 2017-04-15 DIAGNOSIS — S065X0D Traumatic subdural hemorrhage without loss of consciousness, subsequent encounter: Secondary | ICD-10-CM | POA: Diagnosis not present

## 2017-04-15 DIAGNOSIS — E785 Hyperlipidemia, unspecified: Secondary | ICD-10-CM | POA: Diagnosis not present

## 2017-04-15 DIAGNOSIS — I10 Essential (primary) hypertension: Secondary | ICD-10-CM | POA: Diagnosis not present

## 2017-04-15 DIAGNOSIS — K5909 Other constipation: Secondary | ICD-10-CM | POA: Diagnosis not present

## 2017-04-15 DIAGNOSIS — F0150 Vascular dementia without behavioral disturbance: Secondary | ICD-10-CM | POA: Diagnosis not present

## 2017-04-18 DIAGNOSIS — I10 Essential (primary) hypertension: Secondary | ICD-10-CM | POA: Diagnosis not present

## 2017-04-18 DIAGNOSIS — I69318 Other symptoms and signs involving cognitive functions following cerebral infarction: Secondary | ICD-10-CM | POA: Diagnosis not present

## 2017-04-18 DIAGNOSIS — S065X0D Traumatic subdural hemorrhage without loss of consciousness, subsequent encounter: Secondary | ICD-10-CM | POA: Diagnosis not present

## 2017-04-18 DIAGNOSIS — F0150 Vascular dementia without behavioral disturbance: Secondary | ICD-10-CM | POA: Diagnosis not present

## 2017-04-18 DIAGNOSIS — E785 Hyperlipidemia, unspecified: Secondary | ICD-10-CM | POA: Diagnosis not present

## 2017-04-18 DIAGNOSIS — K5909 Other constipation: Secondary | ICD-10-CM | POA: Diagnosis not present

## 2017-04-19 DIAGNOSIS — K5909 Other constipation: Secondary | ICD-10-CM | POA: Diagnosis not present

## 2017-04-19 DIAGNOSIS — S065X0D Traumatic subdural hemorrhage without loss of consciousness, subsequent encounter: Secondary | ICD-10-CM | POA: Diagnosis not present

## 2017-04-19 DIAGNOSIS — I10 Essential (primary) hypertension: Secondary | ICD-10-CM | POA: Diagnosis not present

## 2017-04-19 DIAGNOSIS — F0150 Vascular dementia without behavioral disturbance: Secondary | ICD-10-CM | POA: Diagnosis not present

## 2017-04-19 DIAGNOSIS — I69318 Other symptoms and signs involving cognitive functions following cerebral infarction: Secondary | ICD-10-CM | POA: Diagnosis not present

## 2017-04-19 DIAGNOSIS — E785 Hyperlipidemia, unspecified: Secondary | ICD-10-CM | POA: Diagnosis not present

## 2017-04-20 DIAGNOSIS — S065X0D Traumatic subdural hemorrhage without loss of consciousness, subsequent encounter: Secondary | ICD-10-CM | POA: Diagnosis not present

## 2017-04-20 DIAGNOSIS — I69318 Other symptoms and signs involving cognitive functions following cerebral infarction: Secondary | ICD-10-CM | POA: Diagnosis not present

## 2017-04-20 DIAGNOSIS — I10 Essential (primary) hypertension: Secondary | ICD-10-CM | POA: Diagnosis not present

## 2017-04-20 DIAGNOSIS — F0150 Vascular dementia without behavioral disturbance: Secondary | ICD-10-CM | POA: Diagnosis not present

## 2017-04-20 DIAGNOSIS — K5909 Other constipation: Secondary | ICD-10-CM | POA: Diagnosis not present

## 2017-04-20 DIAGNOSIS — E785 Hyperlipidemia, unspecified: Secondary | ICD-10-CM | POA: Diagnosis not present

## 2017-04-21 DIAGNOSIS — I10 Essential (primary) hypertension: Secondary | ICD-10-CM | POA: Diagnosis not present

## 2017-04-21 DIAGNOSIS — F0150 Vascular dementia without behavioral disturbance: Secondary | ICD-10-CM | POA: Diagnosis not present

## 2017-04-21 DIAGNOSIS — K5909 Other constipation: Secondary | ICD-10-CM | POA: Diagnosis not present

## 2017-04-21 DIAGNOSIS — S065X0D Traumatic subdural hemorrhage without loss of consciousness, subsequent encounter: Secondary | ICD-10-CM | POA: Diagnosis not present

## 2017-04-21 DIAGNOSIS — E785 Hyperlipidemia, unspecified: Secondary | ICD-10-CM | POA: Diagnosis not present

## 2017-04-21 DIAGNOSIS — I69318 Other symptoms and signs involving cognitive functions following cerebral infarction: Secondary | ICD-10-CM | POA: Diagnosis not present

## 2017-04-22 DIAGNOSIS — E785 Hyperlipidemia, unspecified: Secondary | ICD-10-CM | POA: Diagnosis not present

## 2017-04-22 DIAGNOSIS — F0150 Vascular dementia without behavioral disturbance: Secondary | ICD-10-CM | POA: Diagnosis not present

## 2017-04-22 DIAGNOSIS — K5909 Other constipation: Secondary | ICD-10-CM | POA: Diagnosis not present

## 2017-04-22 DIAGNOSIS — I69318 Other symptoms and signs involving cognitive functions following cerebral infarction: Secondary | ICD-10-CM | POA: Diagnosis not present

## 2017-04-22 DIAGNOSIS — I10 Essential (primary) hypertension: Secondary | ICD-10-CM | POA: Diagnosis not present

## 2017-04-22 DIAGNOSIS — S065X0D Traumatic subdural hemorrhage without loss of consciousness, subsequent encounter: Secondary | ICD-10-CM | POA: Diagnosis not present

## 2017-04-23 DIAGNOSIS — Z79899 Other long term (current) drug therapy: Secondary | ICD-10-CM | POA: Diagnosis not present

## 2017-04-23 DIAGNOSIS — M150 Primary generalized (osteo)arthritis: Secondary | ICD-10-CM | POA: Diagnosis not present

## 2017-04-23 DIAGNOSIS — G308 Other Alzheimer's disease: Secondary | ICD-10-CM | POA: Diagnosis not present

## 2017-04-23 DIAGNOSIS — I10 Essential (primary) hypertension: Secondary | ICD-10-CM | POA: Diagnosis not present

## 2017-04-23 DIAGNOSIS — G8929 Other chronic pain: Secondary | ICD-10-CM | POA: Diagnosis not present

## 2017-04-25 DIAGNOSIS — F0150 Vascular dementia without behavioral disturbance: Secondary | ICD-10-CM | POA: Diagnosis not present

## 2017-04-25 DIAGNOSIS — S065X0D Traumatic subdural hemorrhage without loss of consciousness, subsequent encounter: Secondary | ICD-10-CM | POA: Diagnosis not present

## 2017-04-25 DIAGNOSIS — K5909 Other constipation: Secondary | ICD-10-CM | POA: Diagnosis not present

## 2017-04-25 DIAGNOSIS — I10 Essential (primary) hypertension: Secondary | ICD-10-CM | POA: Diagnosis not present

## 2017-04-25 DIAGNOSIS — E785 Hyperlipidemia, unspecified: Secondary | ICD-10-CM | POA: Diagnosis not present

## 2017-04-25 DIAGNOSIS — I69318 Other symptoms and signs involving cognitive functions following cerebral infarction: Secondary | ICD-10-CM | POA: Diagnosis not present

## 2017-04-26 ENCOUNTER — Ambulatory Visit (INDEPENDENT_AMBULATORY_CARE_PROVIDER_SITE_OTHER): Payer: Medicare Other | Admitting: Vascular Surgery

## 2017-04-26 ENCOUNTER — Encounter (INDEPENDENT_AMBULATORY_CARE_PROVIDER_SITE_OTHER): Payer: Medicare Other

## 2017-04-26 DIAGNOSIS — G8929 Other chronic pain: Secondary | ICD-10-CM | POA: Diagnosis not present

## 2017-04-26 DIAGNOSIS — R636 Underweight: Secondary | ICD-10-CM | POA: Diagnosis not present

## 2017-04-26 DIAGNOSIS — K5909 Other constipation: Secondary | ICD-10-CM | POA: Diagnosis not present

## 2017-04-26 DIAGNOSIS — S81802D Unspecified open wound, left lower leg, subsequent encounter: Secondary | ICD-10-CM | POA: Diagnosis not present

## 2017-04-26 DIAGNOSIS — Z79899 Other long term (current) drug therapy: Secondary | ICD-10-CM | POA: Diagnosis not present

## 2017-04-26 DIAGNOSIS — R627 Adult failure to thrive: Secondary | ICD-10-CM | POA: Diagnosis not present

## 2017-04-26 DIAGNOSIS — I69318 Other symptoms and signs involving cognitive functions following cerebral infarction: Secondary | ICD-10-CM | POA: Diagnosis not present

## 2017-04-26 DIAGNOSIS — S065X0D Traumatic subdural hemorrhage without loss of consciousness, subsequent encounter: Secondary | ICD-10-CM | POA: Diagnosis not present

## 2017-04-26 DIAGNOSIS — E785 Hyperlipidemia, unspecified: Secondary | ICD-10-CM | POA: Diagnosis not present

## 2017-04-26 DIAGNOSIS — R2689 Other abnormalities of gait and mobility: Secondary | ICD-10-CM | POA: Diagnosis not present

## 2017-04-26 DIAGNOSIS — F0150 Vascular dementia without behavioral disturbance: Secondary | ICD-10-CM | POA: Diagnosis not present

## 2017-04-26 DIAGNOSIS — G308 Other Alzheimer's disease: Secondary | ICD-10-CM | POA: Diagnosis not present

## 2017-04-26 DIAGNOSIS — I10 Essential (primary) hypertension: Secondary | ICD-10-CM | POA: Diagnosis not present

## 2017-04-27 DIAGNOSIS — I69318 Other symptoms and signs involving cognitive functions following cerebral infarction: Secondary | ICD-10-CM | POA: Diagnosis not present

## 2017-04-27 DIAGNOSIS — K5909 Other constipation: Secondary | ICD-10-CM | POA: Diagnosis not present

## 2017-04-27 DIAGNOSIS — I739 Peripheral vascular disease, unspecified: Secondary | ICD-10-CM | POA: Diagnosis not present

## 2017-04-27 DIAGNOSIS — R2689 Other abnormalities of gait and mobility: Secondary | ICD-10-CM | POA: Diagnosis not present

## 2017-04-27 DIAGNOSIS — F0150 Vascular dementia without behavioral disturbance: Secondary | ICD-10-CM | POA: Diagnosis not present

## 2017-04-27 DIAGNOSIS — E785 Hyperlipidemia, unspecified: Secondary | ICD-10-CM | POA: Diagnosis not present

## 2017-04-27 DIAGNOSIS — M2010 Hallux valgus (acquired), unspecified foot: Secondary | ICD-10-CM | POA: Diagnosis not present

## 2017-04-27 DIAGNOSIS — B351 Tinea unguium: Secondary | ICD-10-CM | POA: Diagnosis not present

## 2017-04-27 DIAGNOSIS — I10 Essential (primary) hypertension: Secondary | ICD-10-CM | POA: Diagnosis not present

## 2017-04-27 DIAGNOSIS — S065X0D Traumatic subdural hemorrhage without loss of consciousness, subsequent encounter: Secondary | ICD-10-CM | POA: Diagnosis not present

## 2017-04-28 DIAGNOSIS — I10 Essential (primary) hypertension: Secondary | ICD-10-CM | POA: Diagnosis not present

## 2017-04-28 DIAGNOSIS — K5909 Other constipation: Secondary | ICD-10-CM | POA: Diagnosis not present

## 2017-04-28 DIAGNOSIS — F0150 Vascular dementia without behavioral disturbance: Secondary | ICD-10-CM | POA: Diagnosis not present

## 2017-04-28 DIAGNOSIS — E785 Hyperlipidemia, unspecified: Secondary | ICD-10-CM | POA: Diagnosis not present

## 2017-04-28 DIAGNOSIS — I69318 Other symptoms and signs involving cognitive functions following cerebral infarction: Secondary | ICD-10-CM | POA: Diagnosis not present

## 2017-04-28 DIAGNOSIS — S065X0D Traumatic subdural hemorrhage without loss of consciousness, subsequent encounter: Secondary | ICD-10-CM | POA: Diagnosis not present

## 2017-04-29 DIAGNOSIS — K5909 Other constipation: Secondary | ICD-10-CM | POA: Diagnosis not present

## 2017-04-29 DIAGNOSIS — S065X0D Traumatic subdural hemorrhage without loss of consciousness, subsequent encounter: Secondary | ICD-10-CM | POA: Diagnosis not present

## 2017-04-29 DIAGNOSIS — R634 Abnormal weight loss: Secondary | ICD-10-CM | POA: Diagnosis not present

## 2017-04-29 DIAGNOSIS — M1991 Primary osteoarthritis, unspecified site: Secondary | ICD-10-CM | POA: Diagnosis not present

## 2017-04-29 DIAGNOSIS — Z681 Body mass index (BMI) 19 or less, adult: Secondary | ICD-10-CM | POA: Diagnosis not present

## 2017-04-29 DIAGNOSIS — Z9181 History of falling: Secondary | ICD-10-CM | POA: Diagnosis not present

## 2017-04-29 DIAGNOSIS — E785 Hyperlipidemia, unspecified: Secondary | ICD-10-CM | POA: Diagnosis not present

## 2017-04-29 DIAGNOSIS — I739 Peripheral vascular disease, unspecified: Secondary | ICD-10-CM | POA: Diagnosis not present

## 2017-04-29 DIAGNOSIS — I10 Essential (primary) hypertension: Secondary | ICD-10-CM | POA: Diagnosis not present

## 2017-04-29 DIAGNOSIS — F0150 Vascular dementia without behavioral disturbance: Secondary | ICD-10-CM | POA: Diagnosis not present

## 2017-04-29 DIAGNOSIS — I69318 Other symptoms and signs involving cognitive functions following cerebral infarction: Secondary | ICD-10-CM | POA: Diagnosis not present

## 2017-05-02 DIAGNOSIS — S065X0D Traumatic subdural hemorrhage without loss of consciousness, subsequent encounter: Secondary | ICD-10-CM | POA: Diagnosis not present

## 2017-05-02 DIAGNOSIS — R634 Abnormal weight loss: Secondary | ICD-10-CM | POA: Diagnosis not present

## 2017-05-02 DIAGNOSIS — F0150 Vascular dementia without behavioral disturbance: Secondary | ICD-10-CM | POA: Diagnosis not present

## 2017-05-02 DIAGNOSIS — I10 Essential (primary) hypertension: Secondary | ICD-10-CM | POA: Diagnosis not present

## 2017-05-02 DIAGNOSIS — I739 Peripheral vascular disease, unspecified: Secondary | ICD-10-CM | POA: Diagnosis not present

## 2017-05-02 DIAGNOSIS — I69318 Other symptoms and signs involving cognitive functions following cerebral infarction: Secondary | ICD-10-CM | POA: Diagnosis not present

## 2017-05-03 DIAGNOSIS — S065X0D Traumatic subdural hemorrhage without loss of consciousness, subsequent encounter: Secondary | ICD-10-CM | POA: Diagnosis not present

## 2017-05-03 DIAGNOSIS — I10 Essential (primary) hypertension: Secondary | ICD-10-CM | POA: Diagnosis not present

## 2017-05-03 DIAGNOSIS — F0150 Vascular dementia without behavioral disturbance: Secondary | ICD-10-CM | POA: Diagnosis not present

## 2017-05-03 DIAGNOSIS — R634 Abnormal weight loss: Secondary | ICD-10-CM | POA: Diagnosis not present

## 2017-05-03 DIAGNOSIS — I739 Peripheral vascular disease, unspecified: Secondary | ICD-10-CM | POA: Diagnosis not present

## 2017-05-03 DIAGNOSIS — I69318 Other symptoms and signs involving cognitive functions following cerebral infarction: Secondary | ICD-10-CM | POA: Diagnosis not present

## 2017-05-04 DIAGNOSIS — I739 Peripheral vascular disease, unspecified: Secondary | ICD-10-CM | POA: Diagnosis not present

## 2017-05-04 DIAGNOSIS — S065X0D Traumatic subdural hemorrhage without loss of consciousness, subsequent encounter: Secondary | ICD-10-CM | POA: Diagnosis not present

## 2017-05-04 DIAGNOSIS — R634 Abnormal weight loss: Secondary | ICD-10-CM | POA: Diagnosis not present

## 2017-05-04 DIAGNOSIS — I10 Essential (primary) hypertension: Secondary | ICD-10-CM | POA: Diagnosis not present

## 2017-05-04 DIAGNOSIS — I69318 Other symptoms and signs involving cognitive functions following cerebral infarction: Secondary | ICD-10-CM | POA: Diagnosis not present

## 2017-05-04 DIAGNOSIS — F0150 Vascular dementia without behavioral disturbance: Secondary | ICD-10-CM | POA: Diagnosis not present

## 2017-05-05 DIAGNOSIS — F0150 Vascular dementia without behavioral disturbance: Secondary | ICD-10-CM | POA: Diagnosis not present

## 2017-05-05 DIAGNOSIS — R634 Abnormal weight loss: Secondary | ICD-10-CM | POA: Diagnosis not present

## 2017-05-05 DIAGNOSIS — I69318 Other symptoms and signs involving cognitive functions following cerebral infarction: Secondary | ICD-10-CM | POA: Diagnosis not present

## 2017-05-05 DIAGNOSIS — S065X0D Traumatic subdural hemorrhage without loss of consciousness, subsequent encounter: Secondary | ICD-10-CM | POA: Diagnosis not present

## 2017-05-05 DIAGNOSIS — I739 Peripheral vascular disease, unspecified: Secondary | ICD-10-CM | POA: Diagnosis not present

## 2017-05-05 DIAGNOSIS — I10 Essential (primary) hypertension: Secondary | ICD-10-CM | POA: Diagnosis not present

## 2017-05-06 DIAGNOSIS — I10 Essential (primary) hypertension: Secondary | ICD-10-CM | POA: Diagnosis not present

## 2017-05-06 DIAGNOSIS — F0150 Vascular dementia without behavioral disturbance: Secondary | ICD-10-CM | POA: Diagnosis not present

## 2017-05-06 DIAGNOSIS — I739 Peripheral vascular disease, unspecified: Secondary | ICD-10-CM | POA: Diagnosis not present

## 2017-05-06 DIAGNOSIS — S065X0D Traumatic subdural hemorrhage without loss of consciousness, subsequent encounter: Secondary | ICD-10-CM | POA: Diagnosis not present

## 2017-05-06 DIAGNOSIS — R634 Abnormal weight loss: Secondary | ICD-10-CM | POA: Diagnosis not present

## 2017-05-06 DIAGNOSIS — I69318 Other symptoms and signs involving cognitive functions following cerebral infarction: Secondary | ICD-10-CM | POA: Diagnosis not present

## 2017-05-09 DIAGNOSIS — I69318 Other symptoms and signs involving cognitive functions following cerebral infarction: Secondary | ICD-10-CM | POA: Diagnosis not present

## 2017-05-09 DIAGNOSIS — S065X0D Traumatic subdural hemorrhage without loss of consciousness, subsequent encounter: Secondary | ICD-10-CM | POA: Diagnosis not present

## 2017-05-09 DIAGNOSIS — F0150 Vascular dementia without behavioral disturbance: Secondary | ICD-10-CM | POA: Diagnosis not present

## 2017-05-09 DIAGNOSIS — R634 Abnormal weight loss: Secondary | ICD-10-CM | POA: Diagnosis not present

## 2017-05-09 DIAGNOSIS — I10 Essential (primary) hypertension: Secondary | ICD-10-CM | POA: Diagnosis not present

## 2017-05-09 DIAGNOSIS — I739 Peripheral vascular disease, unspecified: Secondary | ICD-10-CM | POA: Diagnosis not present

## 2017-05-10 DIAGNOSIS — I10 Essential (primary) hypertension: Secondary | ICD-10-CM | POA: Diagnosis not present

## 2017-05-10 DIAGNOSIS — F0150 Vascular dementia without behavioral disturbance: Secondary | ICD-10-CM | POA: Diagnosis not present

## 2017-05-10 DIAGNOSIS — R634 Abnormal weight loss: Secondary | ICD-10-CM | POA: Diagnosis not present

## 2017-05-10 DIAGNOSIS — S065X0D Traumatic subdural hemorrhage without loss of consciousness, subsequent encounter: Secondary | ICD-10-CM | POA: Diagnosis not present

## 2017-05-10 DIAGNOSIS — I69318 Other symptoms and signs involving cognitive functions following cerebral infarction: Secondary | ICD-10-CM | POA: Diagnosis not present

## 2017-05-10 DIAGNOSIS — I739 Peripheral vascular disease, unspecified: Secondary | ICD-10-CM | POA: Diagnosis not present

## 2017-05-11 DIAGNOSIS — S065X0D Traumatic subdural hemorrhage without loss of consciousness, subsequent encounter: Secondary | ICD-10-CM | POA: Diagnosis not present

## 2017-05-11 DIAGNOSIS — R634 Abnormal weight loss: Secondary | ICD-10-CM | POA: Diagnosis not present

## 2017-05-11 DIAGNOSIS — I69318 Other symptoms and signs involving cognitive functions following cerebral infarction: Secondary | ICD-10-CM | POA: Diagnosis not present

## 2017-05-11 DIAGNOSIS — F0150 Vascular dementia without behavioral disturbance: Secondary | ICD-10-CM | POA: Diagnosis not present

## 2017-05-11 DIAGNOSIS — I10 Essential (primary) hypertension: Secondary | ICD-10-CM | POA: Diagnosis not present

## 2017-05-11 DIAGNOSIS — I739 Peripheral vascular disease, unspecified: Secondary | ICD-10-CM | POA: Diagnosis not present

## 2017-05-12 DIAGNOSIS — I69318 Other symptoms and signs involving cognitive functions following cerebral infarction: Secondary | ICD-10-CM | POA: Diagnosis not present

## 2017-05-12 DIAGNOSIS — R634 Abnormal weight loss: Secondary | ICD-10-CM | POA: Diagnosis not present

## 2017-05-12 DIAGNOSIS — I739 Peripheral vascular disease, unspecified: Secondary | ICD-10-CM | POA: Diagnosis not present

## 2017-05-12 DIAGNOSIS — F0150 Vascular dementia without behavioral disturbance: Secondary | ICD-10-CM | POA: Diagnosis not present

## 2017-05-12 DIAGNOSIS — S065X0D Traumatic subdural hemorrhage without loss of consciousness, subsequent encounter: Secondary | ICD-10-CM | POA: Diagnosis not present

## 2017-05-12 DIAGNOSIS — I10 Essential (primary) hypertension: Secondary | ICD-10-CM | POA: Diagnosis not present

## 2017-05-13 DIAGNOSIS — S065X0D Traumatic subdural hemorrhage without loss of consciousness, subsequent encounter: Secondary | ICD-10-CM | POA: Diagnosis not present

## 2017-05-13 DIAGNOSIS — R634 Abnormal weight loss: Secondary | ICD-10-CM | POA: Diagnosis not present

## 2017-05-13 DIAGNOSIS — I69318 Other symptoms and signs involving cognitive functions following cerebral infarction: Secondary | ICD-10-CM | POA: Diagnosis not present

## 2017-05-13 DIAGNOSIS — F0150 Vascular dementia without behavioral disturbance: Secondary | ICD-10-CM | POA: Diagnosis not present

## 2017-05-13 DIAGNOSIS — I739 Peripheral vascular disease, unspecified: Secondary | ICD-10-CM | POA: Diagnosis not present

## 2017-05-13 DIAGNOSIS — I10 Essential (primary) hypertension: Secondary | ICD-10-CM | POA: Diagnosis not present

## 2017-05-16 DIAGNOSIS — F0150 Vascular dementia without behavioral disturbance: Secondary | ICD-10-CM | POA: Diagnosis not present

## 2017-05-16 DIAGNOSIS — R634 Abnormal weight loss: Secondary | ICD-10-CM | POA: Diagnosis not present

## 2017-05-16 DIAGNOSIS — S065X0D Traumatic subdural hemorrhage without loss of consciousness, subsequent encounter: Secondary | ICD-10-CM | POA: Diagnosis not present

## 2017-05-16 DIAGNOSIS — I739 Peripheral vascular disease, unspecified: Secondary | ICD-10-CM | POA: Diagnosis not present

## 2017-05-16 DIAGNOSIS — I10 Essential (primary) hypertension: Secondary | ICD-10-CM | POA: Diagnosis not present

## 2017-05-16 DIAGNOSIS — I69318 Other symptoms and signs involving cognitive functions following cerebral infarction: Secondary | ICD-10-CM | POA: Diagnosis not present

## 2017-05-17 DIAGNOSIS — I10 Essential (primary) hypertension: Secondary | ICD-10-CM | POA: Diagnosis not present

## 2017-05-17 DIAGNOSIS — R636 Underweight: Secondary | ICD-10-CM | POA: Diagnosis not present

## 2017-05-17 DIAGNOSIS — Z79899 Other long term (current) drug therapy: Secondary | ICD-10-CM | POA: Diagnosis not present

## 2017-05-17 DIAGNOSIS — R634 Abnormal weight loss: Secondary | ICD-10-CM | POA: Diagnosis not present

## 2017-05-17 DIAGNOSIS — R2689 Other abnormalities of gait and mobility: Secondary | ICD-10-CM | POA: Diagnosis not present

## 2017-05-17 DIAGNOSIS — G308 Other Alzheimer's disease: Secondary | ICD-10-CM | POA: Diagnosis not present

## 2017-05-17 DIAGNOSIS — I739 Peripheral vascular disease, unspecified: Secondary | ICD-10-CM | POA: Diagnosis not present

## 2017-05-17 DIAGNOSIS — G8929 Other chronic pain: Secondary | ICD-10-CM | POA: Diagnosis not present

## 2017-05-17 DIAGNOSIS — F0150 Vascular dementia without behavioral disturbance: Secondary | ICD-10-CM | POA: Diagnosis not present

## 2017-05-17 DIAGNOSIS — S065X0D Traumatic subdural hemorrhage without loss of consciousness, subsequent encounter: Secondary | ICD-10-CM | POA: Diagnosis not present

## 2017-05-17 DIAGNOSIS — H1032 Unspecified acute conjunctivitis, left eye: Secondary | ICD-10-CM | POA: Diagnosis not present

## 2017-05-17 DIAGNOSIS — I69318 Other symptoms and signs involving cognitive functions following cerebral infarction: Secondary | ICD-10-CM | POA: Diagnosis not present

## 2017-05-18 DIAGNOSIS — S065X0D Traumatic subdural hemorrhage without loss of consciousness, subsequent encounter: Secondary | ICD-10-CM | POA: Diagnosis not present

## 2017-05-18 DIAGNOSIS — I69318 Other symptoms and signs involving cognitive functions following cerebral infarction: Secondary | ICD-10-CM | POA: Diagnosis not present

## 2017-05-18 DIAGNOSIS — I10 Essential (primary) hypertension: Secondary | ICD-10-CM | POA: Diagnosis not present

## 2017-05-18 DIAGNOSIS — F0150 Vascular dementia without behavioral disturbance: Secondary | ICD-10-CM | POA: Diagnosis not present

## 2017-05-18 DIAGNOSIS — R634 Abnormal weight loss: Secondary | ICD-10-CM | POA: Diagnosis not present

## 2017-05-18 DIAGNOSIS — I739 Peripheral vascular disease, unspecified: Secondary | ICD-10-CM | POA: Diagnosis not present

## 2017-05-19 ENCOUNTER — Other Ambulatory Visit: Payer: Self-pay | Admitting: *Deleted

## 2017-05-19 DIAGNOSIS — I10 Essential (primary) hypertension: Secondary | ICD-10-CM | POA: Diagnosis not present

## 2017-05-19 DIAGNOSIS — I739 Peripheral vascular disease, unspecified: Secondary | ICD-10-CM | POA: Diagnosis not present

## 2017-05-19 DIAGNOSIS — R000 Tachycardia, unspecified: Secondary | ICD-10-CM | POA: Diagnosis not present

## 2017-05-19 DIAGNOSIS — F0150 Vascular dementia without behavioral disturbance: Secondary | ICD-10-CM | POA: Diagnosis not present

## 2017-05-19 DIAGNOSIS — I69318 Other symptoms and signs involving cognitive functions following cerebral infarction: Secondary | ICD-10-CM | POA: Diagnosis not present

## 2017-05-19 DIAGNOSIS — D518 Other vitamin B12 deficiency anemias: Secondary | ICD-10-CM | POA: Diagnosis not present

## 2017-05-19 DIAGNOSIS — F607 Dependent personality disorder: Secondary | ICD-10-CM | POA: Diagnosis not present

## 2017-05-19 DIAGNOSIS — Z79899 Other long term (current) drug therapy: Secondary | ICD-10-CM | POA: Diagnosis not present

## 2017-05-19 DIAGNOSIS — S065X0D Traumatic subdural hemorrhage without loss of consciousness, subsequent encounter: Secondary | ICD-10-CM | POA: Diagnosis not present

## 2017-05-19 DIAGNOSIS — H1032 Unspecified acute conjunctivitis, left eye: Secondary | ICD-10-CM | POA: Diagnosis not present

## 2017-05-19 DIAGNOSIS — R634 Abnormal weight loss: Secondary | ICD-10-CM | POA: Diagnosis not present

## 2017-05-19 DIAGNOSIS — G8929 Other chronic pain: Secondary | ICD-10-CM | POA: Diagnosis not present

## 2017-05-19 MED ORDER — TRAMADOL HCL 50 MG PO TABS: 50.0000 mg | ORAL_TABLET | Freq: Two times a day (BID) | ORAL | 5 refills | Status: AC

## 2017-05-19 NOTE — Telephone Encounter (Signed)
Last OV was 10/13/16, last filled on 11/15/16 #60 tabs with 5 additional refills

## 2017-05-20 DIAGNOSIS — F0150 Vascular dementia without behavioral disturbance: Secondary | ICD-10-CM | POA: Diagnosis not present

## 2017-05-20 DIAGNOSIS — I739 Peripheral vascular disease, unspecified: Secondary | ICD-10-CM | POA: Diagnosis not present

## 2017-05-20 DIAGNOSIS — R634 Abnormal weight loss: Secondary | ICD-10-CM | POA: Diagnosis not present

## 2017-05-20 DIAGNOSIS — I69318 Other symptoms and signs involving cognitive functions following cerebral infarction: Secondary | ICD-10-CM | POA: Diagnosis not present

## 2017-05-20 DIAGNOSIS — I10 Essential (primary) hypertension: Secondary | ICD-10-CM | POA: Diagnosis not present

## 2017-05-20 DIAGNOSIS — S065X0D Traumatic subdural hemorrhage without loss of consciousness, subsequent encounter: Secondary | ICD-10-CM | POA: Diagnosis not present

## 2017-05-23 DIAGNOSIS — I739 Peripheral vascular disease, unspecified: Secondary | ICD-10-CM | POA: Diagnosis not present

## 2017-05-23 DIAGNOSIS — S065X0D Traumatic subdural hemorrhage without loss of consciousness, subsequent encounter: Secondary | ICD-10-CM | POA: Diagnosis not present

## 2017-05-23 DIAGNOSIS — F0150 Vascular dementia without behavioral disturbance: Secondary | ICD-10-CM | POA: Diagnosis not present

## 2017-05-23 DIAGNOSIS — R634 Abnormal weight loss: Secondary | ICD-10-CM | POA: Diagnosis not present

## 2017-05-23 DIAGNOSIS — I69318 Other symptoms and signs involving cognitive functions following cerebral infarction: Secondary | ICD-10-CM | POA: Diagnosis not present

## 2017-05-23 DIAGNOSIS — I10 Essential (primary) hypertension: Secondary | ICD-10-CM | POA: Diagnosis not present

## 2017-05-24 DIAGNOSIS — I739 Peripheral vascular disease, unspecified: Secondary | ICD-10-CM | POA: Diagnosis not present

## 2017-05-24 DIAGNOSIS — I69318 Other symptoms and signs involving cognitive functions following cerebral infarction: Secondary | ICD-10-CM | POA: Diagnosis not present

## 2017-05-24 DIAGNOSIS — R634 Abnormal weight loss: Secondary | ICD-10-CM | POA: Diagnosis not present

## 2017-05-24 DIAGNOSIS — I10 Essential (primary) hypertension: Secondary | ICD-10-CM | POA: Diagnosis not present

## 2017-05-24 DIAGNOSIS — S065X0D Traumatic subdural hemorrhage without loss of consciousness, subsequent encounter: Secondary | ICD-10-CM | POA: Diagnosis not present

## 2017-05-24 DIAGNOSIS — F0150 Vascular dementia without behavioral disturbance: Secondary | ICD-10-CM | POA: Diagnosis not present

## 2017-05-25 DIAGNOSIS — F0150 Vascular dementia without behavioral disturbance: Secondary | ICD-10-CM | POA: Diagnosis not present

## 2017-05-25 DIAGNOSIS — S065X0D Traumatic subdural hemorrhage without loss of consciousness, subsequent encounter: Secondary | ICD-10-CM | POA: Diagnosis not present

## 2017-05-25 DIAGNOSIS — R634 Abnormal weight loss: Secondary | ICD-10-CM | POA: Diagnosis not present

## 2017-05-25 DIAGNOSIS — I69318 Other symptoms and signs involving cognitive functions following cerebral infarction: Secondary | ICD-10-CM | POA: Diagnosis not present

## 2017-05-25 DIAGNOSIS — I10 Essential (primary) hypertension: Secondary | ICD-10-CM | POA: Diagnosis not present

## 2017-05-25 DIAGNOSIS — I739 Peripheral vascular disease, unspecified: Secondary | ICD-10-CM | POA: Diagnosis not present

## 2017-05-26 DIAGNOSIS — I69318 Other symptoms and signs involving cognitive functions following cerebral infarction: Secondary | ICD-10-CM | POA: Diagnosis not present

## 2017-05-26 DIAGNOSIS — R634 Abnormal weight loss: Secondary | ICD-10-CM | POA: Diagnosis not present

## 2017-05-26 DIAGNOSIS — F0150 Vascular dementia without behavioral disturbance: Secondary | ICD-10-CM | POA: Diagnosis not present

## 2017-05-26 DIAGNOSIS — S065X0D Traumatic subdural hemorrhage without loss of consciousness, subsequent encounter: Secondary | ICD-10-CM | POA: Diagnosis not present

## 2017-05-26 DIAGNOSIS — I10 Essential (primary) hypertension: Secondary | ICD-10-CM | POA: Diagnosis not present

## 2017-05-26 DIAGNOSIS — I739 Peripheral vascular disease, unspecified: Secondary | ICD-10-CM | POA: Diagnosis not present

## 2017-05-27 DIAGNOSIS — Z681 Body mass index (BMI) 19 or less, adult: Secondary | ICD-10-CM | POA: Diagnosis not present

## 2017-05-27 DIAGNOSIS — I10 Essential (primary) hypertension: Secondary | ICD-10-CM | POA: Diagnosis not present

## 2017-05-27 DIAGNOSIS — K5909 Other constipation: Secondary | ICD-10-CM | POA: Diagnosis not present

## 2017-05-27 DIAGNOSIS — M1991 Primary osteoarthritis, unspecified site: Secondary | ICD-10-CM | POA: Diagnosis not present

## 2017-05-27 DIAGNOSIS — I739 Peripheral vascular disease, unspecified: Secondary | ICD-10-CM | POA: Diagnosis not present

## 2017-05-27 DIAGNOSIS — F0150 Vascular dementia without behavioral disturbance: Secondary | ICD-10-CM | POA: Diagnosis not present

## 2017-05-27 DIAGNOSIS — Z9181 History of falling: Secondary | ICD-10-CM | POA: Diagnosis not present

## 2017-05-27 DIAGNOSIS — S065X0D Traumatic subdural hemorrhage without loss of consciousness, subsequent encounter: Secondary | ICD-10-CM | POA: Diagnosis not present

## 2017-05-27 DIAGNOSIS — I69318 Other symptoms and signs involving cognitive functions following cerebral infarction: Secondary | ICD-10-CM | POA: Diagnosis not present

## 2017-05-27 DIAGNOSIS — E785 Hyperlipidemia, unspecified: Secondary | ICD-10-CM | POA: Diagnosis not present

## 2017-05-27 DIAGNOSIS — R634 Abnormal weight loss: Secondary | ICD-10-CM | POA: Diagnosis not present

## 2017-05-30 DIAGNOSIS — I10 Essential (primary) hypertension: Secondary | ICD-10-CM | POA: Diagnosis not present

## 2017-05-30 DIAGNOSIS — R634 Abnormal weight loss: Secondary | ICD-10-CM | POA: Diagnosis not present

## 2017-05-30 DIAGNOSIS — S065X0D Traumatic subdural hemorrhage without loss of consciousness, subsequent encounter: Secondary | ICD-10-CM | POA: Diagnosis not present

## 2017-05-30 DIAGNOSIS — I69318 Other symptoms and signs involving cognitive functions following cerebral infarction: Secondary | ICD-10-CM | POA: Diagnosis not present

## 2017-05-30 DIAGNOSIS — F0150 Vascular dementia without behavioral disturbance: Secondary | ICD-10-CM | POA: Diagnosis not present

## 2017-05-30 DIAGNOSIS — I739 Peripheral vascular disease, unspecified: Secondary | ICD-10-CM | POA: Diagnosis not present

## 2017-05-31 DIAGNOSIS — R634 Abnormal weight loss: Secondary | ICD-10-CM | POA: Diagnosis not present

## 2017-05-31 DIAGNOSIS — I10 Essential (primary) hypertension: Secondary | ICD-10-CM | POA: Diagnosis not present

## 2017-05-31 DIAGNOSIS — I739 Peripheral vascular disease, unspecified: Secondary | ICD-10-CM | POA: Diagnosis not present

## 2017-05-31 DIAGNOSIS — S065X0D Traumatic subdural hemorrhage without loss of consciousness, subsequent encounter: Secondary | ICD-10-CM | POA: Diagnosis not present

## 2017-05-31 DIAGNOSIS — I69318 Other symptoms and signs involving cognitive functions following cerebral infarction: Secondary | ICD-10-CM | POA: Diagnosis not present

## 2017-05-31 DIAGNOSIS — F0150 Vascular dementia without behavioral disturbance: Secondary | ICD-10-CM | POA: Diagnosis not present

## 2017-06-01 DIAGNOSIS — S065X0D Traumatic subdural hemorrhage without loss of consciousness, subsequent encounter: Secondary | ICD-10-CM | POA: Diagnosis not present

## 2017-06-01 DIAGNOSIS — F0150 Vascular dementia without behavioral disturbance: Secondary | ICD-10-CM | POA: Diagnosis not present

## 2017-06-01 DIAGNOSIS — I739 Peripheral vascular disease, unspecified: Secondary | ICD-10-CM | POA: Diagnosis not present

## 2017-06-01 DIAGNOSIS — I10 Essential (primary) hypertension: Secondary | ICD-10-CM | POA: Diagnosis not present

## 2017-06-01 DIAGNOSIS — I69318 Other symptoms and signs involving cognitive functions following cerebral infarction: Secondary | ICD-10-CM | POA: Diagnosis not present

## 2017-06-01 DIAGNOSIS — R634 Abnormal weight loss: Secondary | ICD-10-CM | POA: Diagnosis not present

## 2017-06-02 DIAGNOSIS — I739 Peripheral vascular disease, unspecified: Secondary | ICD-10-CM | POA: Diagnosis not present

## 2017-06-02 DIAGNOSIS — I69318 Other symptoms and signs involving cognitive functions following cerebral infarction: Secondary | ICD-10-CM | POA: Diagnosis not present

## 2017-06-02 DIAGNOSIS — S065X0D Traumatic subdural hemorrhage without loss of consciousness, subsequent encounter: Secondary | ICD-10-CM | POA: Diagnosis not present

## 2017-06-02 DIAGNOSIS — I10 Essential (primary) hypertension: Secondary | ICD-10-CM | POA: Diagnosis not present

## 2017-06-02 DIAGNOSIS — R634 Abnormal weight loss: Secondary | ICD-10-CM | POA: Diagnosis not present

## 2017-06-02 DIAGNOSIS — F0150 Vascular dementia without behavioral disturbance: Secondary | ICD-10-CM | POA: Diagnosis not present

## 2017-06-03 DIAGNOSIS — F0150 Vascular dementia without behavioral disturbance: Secondary | ICD-10-CM | POA: Diagnosis not present

## 2017-06-03 DIAGNOSIS — S065X0D Traumatic subdural hemorrhage without loss of consciousness, subsequent encounter: Secondary | ICD-10-CM | POA: Diagnosis not present

## 2017-06-03 DIAGNOSIS — R634 Abnormal weight loss: Secondary | ICD-10-CM | POA: Diagnosis not present

## 2017-06-03 DIAGNOSIS — I739 Peripheral vascular disease, unspecified: Secondary | ICD-10-CM | POA: Diagnosis not present

## 2017-06-03 DIAGNOSIS — I69318 Other symptoms and signs involving cognitive functions following cerebral infarction: Secondary | ICD-10-CM | POA: Diagnosis not present

## 2017-06-03 DIAGNOSIS — I10 Essential (primary) hypertension: Secondary | ICD-10-CM | POA: Diagnosis not present

## 2017-06-06 DIAGNOSIS — S065X0D Traumatic subdural hemorrhage without loss of consciousness, subsequent encounter: Secondary | ICD-10-CM | POA: Diagnosis not present

## 2017-06-06 DIAGNOSIS — I739 Peripheral vascular disease, unspecified: Secondary | ICD-10-CM | POA: Diagnosis not present

## 2017-06-06 DIAGNOSIS — I10 Essential (primary) hypertension: Secondary | ICD-10-CM | POA: Diagnosis not present

## 2017-06-06 DIAGNOSIS — F0150 Vascular dementia without behavioral disturbance: Secondary | ICD-10-CM | POA: Diagnosis not present

## 2017-06-06 DIAGNOSIS — R634 Abnormal weight loss: Secondary | ICD-10-CM | POA: Diagnosis not present

## 2017-06-06 DIAGNOSIS — I69318 Other symptoms and signs involving cognitive functions following cerebral infarction: Secondary | ICD-10-CM | POA: Diagnosis not present

## 2017-06-07 DIAGNOSIS — I69318 Other symptoms and signs involving cognitive functions following cerebral infarction: Secondary | ICD-10-CM | POA: Diagnosis not present

## 2017-06-07 DIAGNOSIS — R634 Abnormal weight loss: Secondary | ICD-10-CM | POA: Diagnosis not present

## 2017-06-07 DIAGNOSIS — F0150 Vascular dementia without behavioral disturbance: Secondary | ICD-10-CM | POA: Diagnosis not present

## 2017-06-07 DIAGNOSIS — S065X0D Traumatic subdural hemorrhage without loss of consciousness, subsequent encounter: Secondary | ICD-10-CM | POA: Diagnosis not present

## 2017-06-07 DIAGNOSIS — I739 Peripheral vascular disease, unspecified: Secondary | ICD-10-CM | POA: Diagnosis not present

## 2017-06-07 DIAGNOSIS — I10 Essential (primary) hypertension: Secondary | ICD-10-CM | POA: Diagnosis not present

## 2017-06-08 DIAGNOSIS — R634 Abnormal weight loss: Secondary | ICD-10-CM | POA: Diagnosis not present

## 2017-06-08 DIAGNOSIS — I69318 Other symptoms and signs involving cognitive functions following cerebral infarction: Secondary | ICD-10-CM | POA: Diagnosis not present

## 2017-06-08 DIAGNOSIS — S065X0D Traumatic subdural hemorrhage without loss of consciousness, subsequent encounter: Secondary | ICD-10-CM | POA: Diagnosis not present

## 2017-06-08 DIAGNOSIS — F0150 Vascular dementia without behavioral disturbance: Secondary | ICD-10-CM | POA: Diagnosis not present

## 2017-06-08 DIAGNOSIS — I739 Peripheral vascular disease, unspecified: Secondary | ICD-10-CM | POA: Diagnosis not present

## 2017-06-08 DIAGNOSIS — I10 Essential (primary) hypertension: Secondary | ICD-10-CM | POA: Diagnosis not present

## 2017-06-09 DIAGNOSIS — I10 Essential (primary) hypertension: Secondary | ICD-10-CM | POA: Diagnosis not present

## 2017-06-09 DIAGNOSIS — I69318 Other symptoms and signs involving cognitive functions following cerebral infarction: Secondary | ICD-10-CM | POA: Diagnosis not present

## 2017-06-09 DIAGNOSIS — R634 Abnormal weight loss: Secondary | ICD-10-CM | POA: Diagnosis not present

## 2017-06-09 DIAGNOSIS — S065X0D Traumatic subdural hemorrhage without loss of consciousness, subsequent encounter: Secondary | ICD-10-CM | POA: Diagnosis not present

## 2017-06-09 DIAGNOSIS — I739 Peripheral vascular disease, unspecified: Secondary | ICD-10-CM | POA: Diagnosis not present

## 2017-06-09 DIAGNOSIS — F0150 Vascular dementia without behavioral disturbance: Secondary | ICD-10-CM | POA: Diagnosis not present

## 2017-06-10 DIAGNOSIS — I739 Peripheral vascular disease, unspecified: Secondary | ICD-10-CM | POA: Diagnosis not present

## 2017-06-10 DIAGNOSIS — I69318 Other symptoms and signs involving cognitive functions following cerebral infarction: Secondary | ICD-10-CM | POA: Diagnosis not present

## 2017-06-10 DIAGNOSIS — S065X0D Traumatic subdural hemorrhage without loss of consciousness, subsequent encounter: Secondary | ICD-10-CM | POA: Diagnosis not present

## 2017-06-10 DIAGNOSIS — R634 Abnormal weight loss: Secondary | ICD-10-CM | POA: Diagnosis not present

## 2017-06-10 DIAGNOSIS — F0150 Vascular dementia without behavioral disturbance: Secondary | ICD-10-CM | POA: Diagnosis not present

## 2017-06-10 DIAGNOSIS — I10 Essential (primary) hypertension: Secondary | ICD-10-CM | POA: Diagnosis not present

## 2017-06-13 DIAGNOSIS — F0150 Vascular dementia without behavioral disturbance: Secondary | ICD-10-CM | POA: Diagnosis not present

## 2017-06-13 DIAGNOSIS — S065X0D Traumatic subdural hemorrhage without loss of consciousness, subsequent encounter: Secondary | ICD-10-CM | POA: Diagnosis not present

## 2017-06-13 DIAGNOSIS — I739 Peripheral vascular disease, unspecified: Secondary | ICD-10-CM | POA: Diagnosis not present

## 2017-06-13 DIAGNOSIS — I10 Essential (primary) hypertension: Secondary | ICD-10-CM | POA: Diagnosis not present

## 2017-06-13 DIAGNOSIS — I69318 Other symptoms and signs involving cognitive functions following cerebral infarction: Secondary | ICD-10-CM | POA: Diagnosis not present

## 2017-06-13 DIAGNOSIS — R634 Abnormal weight loss: Secondary | ICD-10-CM | POA: Diagnosis not present

## 2017-06-14 DIAGNOSIS — R634 Abnormal weight loss: Secondary | ICD-10-CM | POA: Diagnosis not present

## 2017-06-14 DIAGNOSIS — M150 Primary generalized (osteo)arthritis: Secondary | ICD-10-CM | POA: Diagnosis not present

## 2017-06-14 DIAGNOSIS — G8929 Other chronic pain: Secondary | ICD-10-CM | POA: Diagnosis not present

## 2017-06-14 DIAGNOSIS — I739 Peripheral vascular disease, unspecified: Secondary | ICD-10-CM | POA: Diagnosis not present

## 2017-06-14 DIAGNOSIS — I69318 Other symptoms and signs involving cognitive functions following cerebral infarction: Secondary | ICD-10-CM | POA: Diagnosis not present

## 2017-06-14 DIAGNOSIS — R2689 Other abnormalities of gait and mobility: Secondary | ICD-10-CM | POA: Diagnosis not present

## 2017-06-14 DIAGNOSIS — S065X0D Traumatic subdural hemorrhage without loss of consciousness, subsequent encounter: Secondary | ICD-10-CM | POA: Diagnosis not present

## 2017-06-14 DIAGNOSIS — R636 Underweight: Secondary | ICD-10-CM | POA: Diagnosis not present

## 2017-06-14 DIAGNOSIS — Z79899 Other long term (current) drug therapy: Secondary | ICD-10-CM | POA: Diagnosis not present

## 2017-06-14 DIAGNOSIS — F0150 Vascular dementia without behavioral disturbance: Secondary | ICD-10-CM | POA: Diagnosis not present

## 2017-06-14 DIAGNOSIS — G308 Other Alzheimer's disease: Secondary | ICD-10-CM | POA: Diagnosis not present

## 2017-06-14 DIAGNOSIS — I10 Essential (primary) hypertension: Secondary | ICD-10-CM | POA: Diagnosis not present

## 2017-06-15 DIAGNOSIS — F0150 Vascular dementia without behavioral disturbance: Secondary | ICD-10-CM | POA: Diagnosis not present

## 2017-06-15 DIAGNOSIS — S065X0D Traumatic subdural hemorrhage without loss of consciousness, subsequent encounter: Secondary | ICD-10-CM | POA: Diagnosis not present

## 2017-06-15 DIAGNOSIS — I69318 Other symptoms and signs involving cognitive functions following cerebral infarction: Secondary | ICD-10-CM | POA: Diagnosis not present

## 2017-06-15 DIAGNOSIS — R634 Abnormal weight loss: Secondary | ICD-10-CM | POA: Diagnosis not present

## 2017-06-15 DIAGNOSIS — I739 Peripheral vascular disease, unspecified: Secondary | ICD-10-CM | POA: Diagnosis not present

## 2017-06-15 DIAGNOSIS — I10 Essential (primary) hypertension: Secondary | ICD-10-CM | POA: Diagnosis not present

## 2017-06-16 DIAGNOSIS — F0150 Vascular dementia without behavioral disturbance: Secondary | ICD-10-CM | POA: Diagnosis not present

## 2017-06-16 DIAGNOSIS — I10 Essential (primary) hypertension: Secondary | ICD-10-CM | POA: Diagnosis not present

## 2017-06-16 DIAGNOSIS — I69318 Other symptoms and signs involving cognitive functions following cerebral infarction: Secondary | ICD-10-CM | POA: Diagnosis not present

## 2017-06-16 DIAGNOSIS — I739 Peripheral vascular disease, unspecified: Secondary | ICD-10-CM | POA: Diagnosis not present

## 2017-06-16 DIAGNOSIS — R634 Abnormal weight loss: Secondary | ICD-10-CM | POA: Diagnosis not present

## 2017-06-16 DIAGNOSIS — S065X0D Traumatic subdural hemorrhage without loss of consciousness, subsequent encounter: Secondary | ICD-10-CM | POA: Diagnosis not present

## 2017-06-17 DIAGNOSIS — S065X0D Traumatic subdural hemorrhage without loss of consciousness, subsequent encounter: Secondary | ICD-10-CM | POA: Diagnosis not present

## 2017-06-17 DIAGNOSIS — R634 Abnormal weight loss: Secondary | ICD-10-CM | POA: Diagnosis not present

## 2017-06-17 DIAGNOSIS — I69318 Other symptoms and signs involving cognitive functions following cerebral infarction: Secondary | ICD-10-CM | POA: Diagnosis not present

## 2017-06-17 DIAGNOSIS — F0150 Vascular dementia without behavioral disturbance: Secondary | ICD-10-CM | POA: Diagnosis not present

## 2017-06-17 DIAGNOSIS — I739 Peripheral vascular disease, unspecified: Secondary | ICD-10-CM | POA: Diagnosis not present

## 2017-06-17 DIAGNOSIS — I10 Essential (primary) hypertension: Secondary | ICD-10-CM | POA: Diagnosis not present

## 2017-06-20 DIAGNOSIS — I10 Essential (primary) hypertension: Secondary | ICD-10-CM | POA: Diagnosis not present

## 2017-06-20 DIAGNOSIS — R634 Abnormal weight loss: Secondary | ICD-10-CM | POA: Diagnosis not present

## 2017-06-20 DIAGNOSIS — I739 Peripheral vascular disease, unspecified: Secondary | ICD-10-CM | POA: Diagnosis not present

## 2017-06-20 DIAGNOSIS — F0150 Vascular dementia without behavioral disturbance: Secondary | ICD-10-CM | POA: Diagnosis not present

## 2017-06-20 DIAGNOSIS — I69318 Other symptoms and signs involving cognitive functions following cerebral infarction: Secondary | ICD-10-CM | POA: Diagnosis not present

## 2017-06-20 DIAGNOSIS — S065X0D Traumatic subdural hemorrhage without loss of consciousness, subsequent encounter: Secondary | ICD-10-CM | POA: Diagnosis not present

## 2017-06-21 DIAGNOSIS — F0150 Vascular dementia without behavioral disturbance: Secondary | ICD-10-CM | POA: Diagnosis not present

## 2017-06-21 DIAGNOSIS — S065X0D Traumatic subdural hemorrhage without loss of consciousness, subsequent encounter: Secondary | ICD-10-CM | POA: Diagnosis not present

## 2017-06-21 DIAGNOSIS — I10 Essential (primary) hypertension: Secondary | ICD-10-CM | POA: Diagnosis not present

## 2017-06-21 DIAGNOSIS — I69318 Other symptoms and signs involving cognitive functions following cerebral infarction: Secondary | ICD-10-CM | POA: Diagnosis not present

## 2017-06-21 DIAGNOSIS — I739 Peripheral vascular disease, unspecified: Secondary | ICD-10-CM | POA: Diagnosis not present

## 2017-06-21 DIAGNOSIS — R634 Abnormal weight loss: Secondary | ICD-10-CM | POA: Diagnosis not present

## 2017-06-22 DIAGNOSIS — I69318 Other symptoms and signs involving cognitive functions following cerebral infarction: Secondary | ICD-10-CM | POA: Diagnosis not present

## 2017-06-22 DIAGNOSIS — I739 Peripheral vascular disease, unspecified: Secondary | ICD-10-CM | POA: Diagnosis not present

## 2017-06-22 DIAGNOSIS — R634 Abnormal weight loss: Secondary | ICD-10-CM | POA: Diagnosis not present

## 2017-06-22 DIAGNOSIS — F0150 Vascular dementia without behavioral disturbance: Secondary | ICD-10-CM | POA: Diagnosis not present

## 2017-06-22 DIAGNOSIS — S065X0D Traumatic subdural hemorrhage without loss of consciousness, subsequent encounter: Secondary | ICD-10-CM | POA: Diagnosis not present

## 2017-06-22 DIAGNOSIS — I10 Essential (primary) hypertension: Secondary | ICD-10-CM | POA: Diagnosis not present

## 2017-06-23 DIAGNOSIS — R634 Abnormal weight loss: Secondary | ICD-10-CM | POA: Diagnosis not present

## 2017-06-23 DIAGNOSIS — I739 Peripheral vascular disease, unspecified: Secondary | ICD-10-CM | POA: Diagnosis not present

## 2017-06-23 DIAGNOSIS — F0150 Vascular dementia without behavioral disturbance: Secondary | ICD-10-CM | POA: Diagnosis not present

## 2017-06-23 DIAGNOSIS — S065X0D Traumatic subdural hemorrhage without loss of consciousness, subsequent encounter: Secondary | ICD-10-CM | POA: Diagnosis not present

## 2017-06-23 DIAGNOSIS — Z79899 Other long term (current) drug therapy: Secondary | ICD-10-CM | POA: Diagnosis not present

## 2017-06-23 DIAGNOSIS — I69318 Other symptoms and signs involving cognitive functions following cerebral infarction: Secondary | ICD-10-CM | POA: Diagnosis not present

## 2017-06-23 DIAGNOSIS — R2689 Other abnormalities of gait and mobility: Secondary | ICD-10-CM | POA: Diagnosis not present

## 2017-06-23 DIAGNOSIS — R636 Underweight: Secondary | ICD-10-CM | POA: Diagnosis not present

## 2017-06-23 DIAGNOSIS — I10 Essential (primary) hypertension: Secondary | ICD-10-CM | POA: Diagnosis not present

## 2017-06-23 DIAGNOSIS — G308 Other Alzheimer's disease: Secondary | ICD-10-CM | POA: Diagnosis not present

## 2017-06-23 DIAGNOSIS — R1319 Other dysphagia: Secondary | ICD-10-CM | POA: Diagnosis not present

## 2017-06-24 DIAGNOSIS — F0150 Vascular dementia without behavioral disturbance: Secondary | ICD-10-CM | POA: Diagnosis not present

## 2017-06-24 DIAGNOSIS — I10 Essential (primary) hypertension: Secondary | ICD-10-CM | POA: Diagnosis not present

## 2017-06-24 DIAGNOSIS — S065X0D Traumatic subdural hemorrhage without loss of consciousness, subsequent encounter: Secondary | ICD-10-CM | POA: Diagnosis not present

## 2017-06-24 DIAGNOSIS — I739 Peripheral vascular disease, unspecified: Secondary | ICD-10-CM | POA: Diagnosis not present

## 2017-06-24 DIAGNOSIS — I69318 Other symptoms and signs involving cognitive functions following cerebral infarction: Secondary | ICD-10-CM | POA: Diagnosis not present

## 2017-06-24 DIAGNOSIS — R634 Abnormal weight loss: Secondary | ICD-10-CM | POA: Diagnosis not present

## 2017-06-27 DIAGNOSIS — I69318 Other symptoms and signs involving cognitive functions following cerebral infarction: Secondary | ICD-10-CM | POA: Diagnosis not present

## 2017-06-27 DIAGNOSIS — I739 Peripheral vascular disease, unspecified: Secondary | ICD-10-CM | POA: Diagnosis not present

## 2017-06-27 DIAGNOSIS — I10 Essential (primary) hypertension: Secondary | ICD-10-CM | POA: Diagnosis not present

## 2017-06-27 DIAGNOSIS — M1991 Primary osteoarthritis, unspecified site: Secondary | ICD-10-CM | POA: Diagnosis not present

## 2017-06-27 DIAGNOSIS — S065X0D Traumatic subdural hemorrhage without loss of consciousness, subsequent encounter: Secondary | ICD-10-CM | POA: Diagnosis not present

## 2017-06-27 DIAGNOSIS — K5909 Other constipation: Secondary | ICD-10-CM | POA: Diagnosis not present

## 2017-06-27 DIAGNOSIS — R634 Abnormal weight loss: Secondary | ICD-10-CM | POA: Diagnosis not present

## 2017-06-27 DIAGNOSIS — R627 Adult failure to thrive: Secondary | ICD-10-CM | POA: Diagnosis not present

## 2017-06-27 DIAGNOSIS — Z9181 History of falling: Secondary | ICD-10-CM | POA: Diagnosis not present

## 2017-06-27 DIAGNOSIS — Z681 Body mass index (BMI) 19 or less, adult: Secondary | ICD-10-CM | POA: Diagnosis not present

## 2017-06-27 DIAGNOSIS — F0150 Vascular dementia without behavioral disturbance: Secondary | ICD-10-CM | POA: Diagnosis not present

## 2017-06-27 DIAGNOSIS — I6529 Occlusion and stenosis of unspecified carotid artery: Secondary | ICD-10-CM | POA: Diagnosis not present

## 2017-06-27 DIAGNOSIS — E785 Hyperlipidemia, unspecified: Secondary | ICD-10-CM | POA: Diagnosis not present

## 2017-06-27 DIAGNOSIS — R64 Cachexia: Secondary | ICD-10-CM | POA: Diagnosis not present

## 2017-06-27 DIAGNOSIS — R55 Syncope and collapse: Secondary | ICD-10-CM | POA: Diagnosis not present

## 2017-06-28 DIAGNOSIS — R1319 Other dysphagia: Secondary | ICD-10-CM | POA: Diagnosis not present

## 2017-06-28 DIAGNOSIS — Z87440 Personal history of urinary (tract) infections: Secondary | ICD-10-CM | POA: Diagnosis not present

## 2017-06-28 DIAGNOSIS — G8929 Other chronic pain: Secondary | ICD-10-CM | POA: Diagnosis not present

## 2017-06-28 DIAGNOSIS — R350 Frequency of micturition: Secondary | ICD-10-CM | POA: Diagnosis not present

## 2017-06-28 DIAGNOSIS — Z79899 Other long term (current) drug therapy: Secondary | ICD-10-CM | POA: Diagnosis not present

## 2017-06-28 DIAGNOSIS — S065X0D Traumatic subdural hemorrhage without loss of consciousness, subsequent encounter: Secondary | ICD-10-CM | POA: Diagnosis not present

## 2017-06-28 DIAGNOSIS — I69318 Other symptoms and signs involving cognitive functions following cerebral infarction: Secondary | ICD-10-CM | POA: Diagnosis not present

## 2017-06-28 DIAGNOSIS — G308 Other Alzheimer's disease: Secondary | ICD-10-CM | POA: Diagnosis not present

## 2017-06-28 DIAGNOSIS — F0150 Vascular dementia without behavioral disturbance: Secondary | ICD-10-CM | POA: Diagnosis not present

## 2017-06-28 DIAGNOSIS — I739 Peripheral vascular disease, unspecified: Secondary | ICD-10-CM | POA: Diagnosis not present

## 2017-06-28 DIAGNOSIS — R2689 Other abnormalities of gait and mobility: Secondary | ICD-10-CM | POA: Diagnosis not present

## 2017-06-28 DIAGNOSIS — I10 Essential (primary) hypertension: Secondary | ICD-10-CM | POA: Diagnosis not present

## 2017-06-28 DIAGNOSIS — R634 Abnormal weight loss: Secondary | ICD-10-CM | POA: Diagnosis not present

## 2017-06-29 DIAGNOSIS — I69318 Other symptoms and signs involving cognitive functions following cerebral infarction: Secondary | ICD-10-CM | POA: Diagnosis not present

## 2017-06-29 DIAGNOSIS — I10 Essential (primary) hypertension: Secondary | ICD-10-CM | POA: Diagnosis not present

## 2017-06-29 DIAGNOSIS — R634 Abnormal weight loss: Secondary | ICD-10-CM | POA: Diagnosis not present

## 2017-06-29 DIAGNOSIS — F0150 Vascular dementia without behavioral disturbance: Secondary | ICD-10-CM | POA: Diagnosis not present

## 2017-06-29 DIAGNOSIS — I739 Peripheral vascular disease, unspecified: Secondary | ICD-10-CM | POA: Diagnosis not present

## 2017-06-29 DIAGNOSIS — S065X0D Traumatic subdural hemorrhage without loss of consciousness, subsequent encounter: Secondary | ICD-10-CM | POA: Diagnosis not present

## 2017-06-30 DIAGNOSIS — I10 Essential (primary) hypertension: Secondary | ICD-10-CM | POA: Diagnosis not present

## 2017-06-30 DIAGNOSIS — F0150 Vascular dementia without behavioral disturbance: Secondary | ICD-10-CM | POA: Diagnosis not present

## 2017-06-30 DIAGNOSIS — S065X0D Traumatic subdural hemorrhage without loss of consciousness, subsequent encounter: Secondary | ICD-10-CM | POA: Diagnosis not present

## 2017-06-30 DIAGNOSIS — I69318 Other symptoms and signs involving cognitive functions following cerebral infarction: Secondary | ICD-10-CM | POA: Diagnosis not present

## 2017-06-30 DIAGNOSIS — I739 Peripheral vascular disease, unspecified: Secondary | ICD-10-CM | POA: Diagnosis not present

## 2017-06-30 DIAGNOSIS — R634 Abnormal weight loss: Secondary | ICD-10-CM | POA: Diagnosis not present

## 2017-07-01 DIAGNOSIS — F0150 Vascular dementia without behavioral disturbance: Secondary | ICD-10-CM | POA: Diagnosis not present

## 2017-07-01 DIAGNOSIS — I69318 Other symptoms and signs involving cognitive functions following cerebral infarction: Secondary | ICD-10-CM | POA: Diagnosis not present

## 2017-07-01 DIAGNOSIS — I739 Peripheral vascular disease, unspecified: Secondary | ICD-10-CM | POA: Diagnosis not present

## 2017-07-01 DIAGNOSIS — I10 Essential (primary) hypertension: Secondary | ICD-10-CM | POA: Diagnosis not present

## 2017-07-01 DIAGNOSIS — S065X0D Traumatic subdural hemorrhage without loss of consciousness, subsequent encounter: Secondary | ICD-10-CM | POA: Diagnosis not present

## 2017-07-01 DIAGNOSIS — R634 Abnormal weight loss: Secondary | ICD-10-CM | POA: Diagnosis not present

## 2017-07-04 DIAGNOSIS — I739 Peripheral vascular disease, unspecified: Secondary | ICD-10-CM | POA: Diagnosis not present

## 2017-07-04 DIAGNOSIS — S065X0D Traumatic subdural hemorrhage without loss of consciousness, subsequent encounter: Secondary | ICD-10-CM | POA: Diagnosis not present

## 2017-07-04 DIAGNOSIS — I69318 Other symptoms and signs involving cognitive functions following cerebral infarction: Secondary | ICD-10-CM | POA: Diagnosis not present

## 2017-07-04 DIAGNOSIS — I10 Essential (primary) hypertension: Secondary | ICD-10-CM | POA: Diagnosis not present

## 2017-07-04 DIAGNOSIS — R634 Abnormal weight loss: Secondary | ICD-10-CM | POA: Diagnosis not present

## 2017-07-04 DIAGNOSIS — F0150 Vascular dementia without behavioral disturbance: Secondary | ICD-10-CM | POA: Diagnosis not present

## 2017-07-05 DIAGNOSIS — I739 Peripheral vascular disease, unspecified: Secondary | ICD-10-CM | POA: Diagnosis not present

## 2017-07-05 DIAGNOSIS — I10 Essential (primary) hypertension: Secondary | ICD-10-CM | POA: Diagnosis not present

## 2017-07-05 DIAGNOSIS — F0150 Vascular dementia without behavioral disturbance: Secondary | ICD-10-CM | POA: Diagnosis not present

## 2017-07-05 DIAGNOSIS — I69318 Other symptoms and signs involving cognitive functions following cerebral infarction: Secondary | ICD-10-CM | POA: Diagnosis not present

## 2017-07-05 DIAGNOSIS — S065X0D Traumatic subdural hemorrhage without loss of consciousness, subsequent encounter: Secondary | ICD-10-CM | POA: Diagnosis not present

## 2017-07-05 DIAGNOSIS — R634 Abnormal weight loss: Secondary | ICD-10-CM | POA: Diagnosis not present

## 2017-07-06 DIAGNOSIS — S90415A Abrasion, left lesser toe(s), initial encounter: Secondary | ICD-10-CM | POA: Diagnosis not present

## 2017-07-06 DIAGNOSIS — I739 Peripheral vascular disease, unspecified: Secondary | ICD-10-CM | POA: Diagnosis not present

## 2017-07-06 DIAGNOSIS — S90411A Abrasion, right great toe, initial encounter: Secondary | ICD-10-CM | POA: Diagnosis not present

## 2017-07-06 DIAGNOSIS — I69318 Other symptoms and signs involving cognitive functions following cerebral infarction: Secondary | ICD-10-CM | POA: Diagnosis not present

## 2017-07-06 DIAGNOSIS — M2010 Hallux valgus (acquired), unspecified foot: Secondary | ICD-10-CM | POA: Diagnosis not present

## 2017-07-06 DIAGNOSIS — I10 Essential (primary) hypertension: Secondary | ICD-10-CM | POA: Diagnosis not present

## 2017-07-06 DIAGNOSIS — S90414A Abrasion, right lesser toe(s), initial encounter: Secondary | ICD-10-CM | POA: Diagnosis not present

## 2017-07-06 DIAGNOSIS — S065X0D Traumatic subdural hemorrhage without loss of consciousness, subsequent encounter: Secondary | ICD-10-CM | POA: Diagnosis not present

## 2017-07-06 DIAGNOSIS — R634 Abnormal weight loss: Secondary | ICD-10-CM | POA: Diagnosis not present

## 2017-07-06 DIAGNOSIS — F0150 Vascular dementia without behavioral disturbance: Secondary | ICD-10-CM | POA: Diagnosis not present

## 2017-07-06 DIAGNOSIS — B351 Tinea unguium: Secondary | ICD-10-CM | POA: Diagnosis not present

## 2017-07-06 DIAGNOSIS — M2040 Other hammer toe(s) (acquired), unspecified foot: Secondary | ICD-10-CM | POA: Diagnosis not present

## 2017-07-07 DIAGNOSIS — D518 Other vitamin B12 deficiency anemias: Secondary | ICD-10-CM | POA: Diagnosis not present

## 2017-07-07 DIAGNOSIS — R636 Underweight: Secondary | ICD-10-CM | POA: Diagnosis not present

## 2017-07-07 DIAGNOSIS — I10 Essential (primary) hypertension: Secondary | ICD-10-CM | POA: Diagnosis not present

## 2017-07-07 DIAGNOSIS — I69318 Other symptoms and signs involving cognitive functions following cerebral infarction: Secondary | ICD-10-CM | POA: Diagnosis not present

## 2017-07-07 DIAGNOSIS — S065X0D Traumatic subdural hemorrhage without loss of consciousness, subsequent encounter: Secondary | ICD-10-CM | POA: Diagnosis not present

## 2017-07-07 DIAGNOSIS — I739 Peripheral vascular disease, unspecified: Secondary | ICD-10-CM | POA: Diagnosis not present

## 2017-07-07 DIAGNOSIS — R1319 Other dysphagia: Secondary | ICD-10-CM | POA: Diagnosis not present

## 2017-07-07 DIAGNOSIS — R000 Tachycardia, unspecified: Secondary | ICD-10-CM | POA: Diagnosis not present

## 2017-07-07 DIAGNOSIS — R634 Abnormal weight loss: Secondary | ICD-10-CM | POA: Diagnosis not present

## 2017-07-07 DIAGNOSIS — Z79899 Other long term (current) drug therapy: Secondary | ICD-10-CM | POA: Diagnosis not present

## 2017-07-07 DIAGNOSIS — F0150 Vascular dementia without behavioral disturbance: Secondary | ICD-10-CM | POA: Diagnosis not present

## 2017-07-07 DIAGNOSIS — S41102S Unspecified open wound of left upper arm, sequela: Secondary | ICD-10-CM | POA: Diagnosis not present

## 2017-07-08 DIAGNOSIS — I10 Essential (primary) hypertension: Secondary | ICD-10-CM | POA: Diagnosis not present

## 2017-07-08 DIAGNOSIS — S065X0D Traumatic subdural hemorrhage without loss of consciousness, subsequent encounter: Secondary | ICD-10-CM | POA: Diagnosis not present

## 2017-07-08 DIAGNOSIS — F0150 Vascular dementia without behavioral disturbance: Secondary | ICD-10-CM | POA: Diagnosis not present

## 2017-07-08 DIAGNOSIS — R634 Abnormal weight loss: Secondary | ICD-10-CM | POA: Diagnosis not present

## 2017-07-08 DIAGNOSIS — I739 Peripheral vascular disease, unspecified: Secondary | ICD-10-CM | POA: Diagnosis not present

## 2017-07-08 DIAGNOSIS — I69318 Other symptoms and signs involving cognitive functions following cerebral infarction: Secondary | ICD-10-CM | POA: Diagnosis not present

## 2017-07-09 DIAGNOSIS — F418 Other specified anxiety disorders: Secondary | ICD-10-CM | POA: Diagnosis not present

## 2017-07-09 DIAGNOSIS — G8929 Other chronic pain: Secondary | ICD-10-CM | POA: Diagnosis not present

## 2017-07-09 DIAGNOSIS — I10 Essential (primary) hypertension: Secondary | ICD-10-CM | POA: Diagnosis not present

## 2017-07-09 DIAGNOSIS — Z79899 Other long term (current) drug therapy: Secondary | ICD-10-CM | POA: Diagnosis not present

## 2017-07-09 DIAGNOSIS — M150 Primary generalized (osteo)arthritis: Secondary | ICD-10-CM | POA: Diagnosis not present

## 2017-07-09 DIAGNOSIS — G308 Other Alzheimer's disease: Secondary | ICD-10-CM | POA: Diagnosis not present

## 2017-07-11 DIAGNOSIS — F0150 Vascular dementia without behavioral disturbance: Secondary | ICD-10-CM | POA: Diagnosis not present

## 2017-07-11 DIAGNOSIS — I10 Essential (primary) hypertension: Secondary | ICD-10-CM | POA: Diagnosis not present

## 2017-07-11 DIAGNOSIS — I739 Peripheral vascular disease, unspecified: Secondary | ICD-10-CM | POA: Diagnosis not present

## 2017-07-11 DIAGNOSIS — S065X0D Traumatic subdural hemorrhage without loss of consciousness, subsequent encounter: Secondary | ICD-10-CM | POA: Diagnosis not present

## 2017-07-11 DIAGNOSIS — R634 Abnormal weight loss: Secondary | ICD-10-CM | POA: Diagnosis not present

## 2017-07-11 DIAGNOSIS — I69318 Other symptoms and signs involving cognitive functions following cerebral infarction: Secondary | ICD-10-CM | POA: Diagnosis not present

## 2017-07-12 DIAGNOSIS — R634 Abnormal weight loss: Secondary | ICD-10-CM | POA: Diagnosis not present

## 2017-07-12 DIAGNOSIS — F0150 Vascular dementia without behavioral disturbance: Secondary | ICD-10-CM | POA: Diagnosis not present

## 2017-07-12 DIAGNOSIS — I739 Peripheral vascular disease, unspecified: Secondary | ICD-10-CM | POA: Diagnosis not present

## 2017-07-12 DIAGNOSIS — S065X0D Traumatic subdural hemorrhage without loss of consciousness, subsequent encounter: Secondary | ICD-10-CM | POA: Diagnosis not present

## 2017-07-12 DIAGNOSIS — I69318 Other symptoms and signs involving cognitive functions following cerebral infarction: Secondary | ICD-10-CM | POA: Diagnosis not present

## 2017-07-12 DIAGNOSIS — I10 Essential (primary) hypertension: Secondary | ICD-10-CM | POA: Diagnosis not present

## 2017-07-13 DIAGNOSIS — S065X0D Traumatic subdural hemorrhage without loss of consciousness, subsequent encounter: Secondary | ICD-10-CM | POA: Diagnosis not present

## 2017-07-13 DIAGNOSIS — R634 Abnormal weight loss: Secondary | ICD-10-CM | POA: Diagnosis not present

## 2017-07-13 DIAGNOSIS — I10 Essential (primary) hypertension: Secondary | ICD-10-CM | POA: Diagnosis not present

## 2017-07-13 DIAGNOSIS — I69318 Other symptoms and signs involving cognitive functions following cerebral infarction: Secondary | ICD-10-CM | POA: Diagnosis not present

## 2017-07-13 DIAGNOSIS — I739 Peripheral vascular disease, unspecified: Secondary | ICD-10-CM | POA: Diagnosis not present

## 2017-07-13 DIAGNOSIS — F0150 Vascular dementia without behavioral disturbance: Secondary | ICD-10-CM | POA: Diagnosis not present

## 2017-07-14 DIAGNOSIS — R634 Abnormal weight loss: Secondary | ICD-10-CM | POA: Diagnosis not present

## 2017-07-14 DIAGNOSIS — I69318 Other symptoms and signs involving cognitive functions following cerebral infarction: Secondary | ICD-10-CM | POA: Diagnosis not present

## 2017-07-14 DIAGNOSIS — F0150 Vascular dementia without behavioral disturbance: Secondary | ICD-10-CM | POA: Diagnosis not present

## 2017-07-14 DIAGNOSIS — S065X0D Traumatic subdural hemorrhage without loss of consciousness, subsequent encounter: Secondary | ICD-10-CM | POA: Diagnosis not present

## 2017-07-14 DIAGNOSIS — I10 Essential (primary) hypertension: Secondary | ICD-10-CM | POA: Diagnosis not present

## 2017-07-14 DIAGNOSIS — I739 Peripheral vascular disease, unspecified: Secondary | ICD-10-CM | POA: Diagnosis not present

## 2017-07-15 DIAGNOSIS — I69318 Other symptoms and signs involving cognitive functions following cerebral infarction: Secondary | ICD-10-CM | POA: Diagnosis not present

## 2017-07-15 DIAGNOSIS — I10 Essential (primary) hypertension: Secondary | ICD-10-CM | POA: Diagnosis not present

## 2017-07-15 DIAGNOSIS — I739 Peripheral vascular disease, unspecified: Secondary | ICD-10-CM | POA: Diagnosis not present

## 2017-07-15 DIAGNOSIS — S065X0D Traumatic subdural hemorrhage without loss of consciousness, subsequent encounter: Secondary | ICD-10-CM | POA: Diagnosis not present

## 2017-07-15 DIAGNOSIS — R634 Abnormal weight loss: Secondary | ICD-10-CM | POA: Diagnosis not present

## 2017-07-15 DIAGNOSIS — F0150 Vascular dementia without behavioral disturbance: Secondary | ICD-10-CM | POA: Diagnosis not present

## 2017-07-18 DIAGNOSIS — I69318 Other symptoms and signs involving cognitive functions following cerebral infarction: Secondary | ICD-10-CM | POA: Diagnosis not present

## 2017-07-18 DIAGNOSIS — I10 Essential (primary) hypertension: Secondary | ICD-10-CM | POA: Diagnosis not present

## 2017-07-18 DIAGNOSIS — R634 Abnormal weight loss: Secondary | ICD-10-CM | POA: Diagnosis not present

## 2017-07-18 DIAGNOSIS — S065X0D Traumatic subdural hemorrhage without loss of consciousness, subsequent encounter: Secondary | ICD-10-CM | POA: Diagnosis not present

## 2017-07-18 DIAGNOSIS — I739 Peripheral vascular disease, unspecified: Secondary | ICD-10-CM | POA: Diagnosis not present

## 2017-07-18 DIAGNOSIS — F0150 Vascular dementia without behavioral disturbance: Secondary | ICD-10-CM | POA: Diagnosis not present

## 2017-07-19 DIAGNOSIS — I69318 Other symptoms and signs involving cognitive functions following cerebral infarction: Secondary | ICD-10-CM | POA: Diagnosis not present

## 2017-07-19 DIAGNOSIS — R634 Abnormal weight loss: Secondary | ICD-10-CM | POA: Diagnosis not present

## 2017-07-19 DIAGNOSIS — I10 Essential (primary) hypertension: Secondary | ICD-10-CM | POA: Diagnosis not present

## 2017-07-19 DIAGNOSIS — S065X0D Traumatic subdural hemorrhage without loss of consciousness, subsequent encounter: Secondary | ICD-10-CM | POA: Diagnosis not present

## 2017-07-19 DIAGNOSIS — I739 Peripheral vascular disease, unspecified: Secondary | ICD-10-CM | POA: Diagnosis not present

## 2017-07-19 DIAGNOSIS — F0150 Vascular dementia without behavioral disturbance: Secondary | ICD-10-CM | POA: Diagnosis not present

## 2017-07-20 DIAGNOSIS — I69318 Other symptoms and signs involving cognitive functions following cerebral infarction: Secondary | ICD-10-CM | POA: Diagnosis not present

## 2017-07-20 DIAGNOSIS — I10 Essential (primary) hypertension: Secondary | ICD-10-CM | POA: Diagnosis not present

## 2017-07-20 DIAGNOSIS — S065X0D Traumatic subdural hemorrhage without loss of consciousness, subsequent encounter: Secondary | ICD-10-CM | POA: Diagnosis not present

## 2017-07-20 DIAGNOSIS — F0150 Vascular dementia without behavioral disturbance: Secondary | ICD-10-CM | POA: Diagnosis not present

## 2017-07-20 DIAGNOSIS — R634 Abnormal weight loss: Secondary | ICD-10-CM | POA: Diagnosis not present

## 2017-07-20 DIAGNOSIS — I739 Peripheral vascular disease, unspecified: Secondary | ICD-10-CM | POA: Diagnosis not present

## 2017-07-21 DIAGNOSIS — S065X0D Traumatic subdural hemorrhage without loss of consciousness, subsequent encounter: Secondary | ICD-10-CM | POA: Diagnosis not present

## 2017-07-21 DIAGNOSIS — I10 Essential (primary) hypertension: Secondary | ICD-10-CM | POA: Diagnosis not present

## 2017-07-21 DIAGNOSIS — I69318 Other symptoms and signs involving cognitive functions following cerebral infarction: Secondary | ICD-10-CM | POA: Diagnosis not present

## 2017-07-21 DIAGNOSIS — I739 Peripheral vascular disease, unspecified: Secondary | ICD-10-CM | POA: Diagnosis not present

## 2017-07-21 DIAGNOSIS — F0150 Vascular dementia without behavioral disturbance: Secondary | ICD-10-CM | POA: Diagnosis not present

## 2017-07-21 DIAGNOSIS — R634 Abnormal weight loss: Secondary | ICD-10-CM | POA: Diagnosis not present

## 2017-07-22 DIAGNOSIS — I69318 Other symptoms and signs involving cognitive functions following cerebral infarction: Secondary | ICD-10-CM | POA: Diagnosis not present

## 2017-07-22 DIAGNOSIS — R634 Abnormal weight loss: Secondary | ICD-10-CM | POA: Diagnosis not present

## 2017-07-22 DIAGNOSIS — S065X0D Traumatic subdural hemorrhage without loss of consciousness, subsequent encounter: Secondary | ICD-10-CM | POA: Diagnosis not present

## 2017-07-22 DIAGNOSIS — I10 Essential (primary) hypertension: Secondary | ICD-10-CM | POA: Diagnosis not present

## 2017-07-22 DIAGNOSIS — I739 Peripheral vascular disease, unspecified: Secondary | ICD-10-CM | POA: Diagnosis not present

## 2017-07-22 DIAGNOSIS — F0150 Vascular dementia without behavioral disturbance: Secondary | ICD-10-CM | POA: Diagnosis not present

## 2017-07-25 DIAGNOSIS — R634 Abnormal weight loss: Secondary | ICD-10-CM | POA: Diagnosis not present

## 2017-07-25 DIAGNOSIS — S065X0D Traumatic subdural hemorrhage without loss of consciousness, subsequent encounter: Secondary | ICD-10-CM | POA: Diagnosis not present

## 2017-07-25 DIAGNOSIS — I739 Peripheral vascular disease, unspecified: Secondary | ICD-10-CM | POA: Diagnosis not present

## 2017-07-25 DIAGNOSIS — F0150 Vascular dementia without behavioral disturbance: Secondary | ICD-10-CM | POA: Diagnosis not present

## 2017-07-25 DIAGNOSIS — I69318 Other symptoms and signs involving cognitive functions following cerebral infarction: Secondary | ICD-10-CM | POA: Diagnosis not present

## 2017-07-25 DIAGNOSIS — I10 Essential (primary) hypertension: Secondary | ICD-10-CM | POA: Diagnosis not present

## 2017-07-26 DIAGNOSIS — F0150 Vascular dementia without behavioral disturbance: Secondary | ICD-10-CM | POA: Diagnosis not present

## 2017-07-26 DIAGNOSIS — I10 Essential (primary) hypertension: Secondary | ICD-10-CM | POA: Diagnosis not present

## 2017-07-26 DIAGNOSIS — I739 Peripheral vascular disease, unspecified: Secondary | ICD-10-CM | POA: Diagnosis not present

## 2017-07-26 DIAGNOSIS — R634 Abnormal weight loss: Secondary | ICD-10-CM | POA: Diagnosis not present

## 2017-07-26 DIAGNOSIS — S065X0D Traumatic subdural hemorrhage without loss of consciousness, subsequent encounter: Secondary | ICD-10-CM | POA: Diagnosis not present

## 2017-07-26 DIAGNOSIS — I69318 Other symptoms and signs involving cognitive functions following cerebral infarction: Secondary | ICD-10-CM | POA: Diagnosis not present

## 2017-07-27 DIAGNOSIS — R634 Abnormal weight loss: Secondary | ICD-10-CM | POA: Diagnosis not present

## 2017-07-27 DIAGNOSIS — Z681 Body mass index (BMI) 19 or less, adult: Secondary | ICD-10-CM | POA: Diagnosis not present

## 2017-07-27 DIAGNOSIS — R55 Syncope and collapse: Secondary | ICD-10-CM | POA: Diagnosis not present

## 2017-07-27 DIAGNOSIS — Z9181 History of falling: Secondary | ICD-10-CM | POA: Diagnosis not present

## 2017-07-27 DIAGNOSIS — R64 Cachexia: Secondary | ICD-10-CM | POA: Diagnosis not present

## 2017-07-27 DIAGNOSIS — I739 Peripheral vascular disease, unspecified: Secondary | ICD-10-CM | POA: Diagnosis not present

## 2017-07-27 DIAGNOSIS — R627 Adult failure to thrive: Secondary | ICD-10-CM | POA: Diagnosis not present

## 2017-07-27 DIAGNOSIS — I69318 Other symptoms and signs involving cognitive functions following cerebral infarction: Secondary | ICD-10-CM | POA: Diagnosis not present

## 2017-07-27 DIAGNOSIS — S065X0D Traumatic subdural hemorrhage without loss of consciousness, subsequent encounter: Secondary | ICD-10-CM | POA: Diagnosis not present

## 2017-07-27 DIAGNOSIS — I6529 Occlusion and stenosis of unspecified carotid artery: Secondary | ICD-10-CM | POA: Diagnosis not present

## 2017-07-27 DIAGNOSIS — F0150 Vascular dementia without behavioral disturbance: Secondary | ICD-10-CM | POA: Diagnosis not present

## 2017-07-27 DIAGNOSIS — M1991 Primary osteoarthritis, unspecified site: Secondary | ICD-10-CM | POA: Diagnosis not present

## 2017-07-27 DIAGNOSIS — I10 Essential (primary) hypertension: Secondary | ICD-10-CM | POA: Diagnosis not present

## 2017-07-27 DIAGNOSIS — K5909 Other constipation: Secondary | ICD-10-CM | POA: Diagnosis not present

## 2017-07-27 DIAGNOSIS — E785 Hyperlipidemia, unspecified: Secondary | ICD-10-CM | POA: Diagnosis not present

## 2017-07-28 DIAGNOSIS — I69318 Other symptoms and signs involving cognitive functions following cerebral infarction: Secondary | ICD-10-CM | POA: Diagnosis not present

## 2017-07-28 DIAGNOSIS — R634 Abnormal weight loss: Secondary | ICD-10-CM | POA: Diagnosis not present

## 2017-07-28 DIAGNOSIS — I739 Peripheral vascular disease, unspecified: Secondary | ICD-10-CM | POA: Diagnosis not present

## 2017-07-28 DIAGNOSIS — S065X0D Traumatic subdural hemorrhage without loss of consciousness, subsequent encounter: Secondary | ICD-10-CM | POA: Diagnosis not present

## 2017-07-28 DIAGNOSIS — F0150 Vascular dementia without behavioral disturbance: Secondary | ICD-10-CM | POA: Diagnosis not present

## 2017-07-28 DIAGNOSIS — I10 Essential (primary) hypertension: Secondary | ICD-10-CM | POA: Diagnosis not present

## 2017-07-29 DIAGNOSIS — I69318 Other symptoms and signs involving cognitive functions following cerebral infarction: Secondary | ICD-10-CM | POA: Diagnosis not present

## 2017-07-29 DIAGNOSIS — F0150 Vascular dementia without behavioral disturbance: Secondary | ICD-10-CM | POA: Diagnosis not present

## 2017-07-29 DIAGNOSIS — I739 Peripheral vascular disease, unspecified: Secondary | ICD-10-CM | POA: Diagnosis not present

## 2017-07-29 DIAGNOSIS — I10 Essential (primary) hypertension: Secondary | ICD-10-CM | POA: Diagnosis not present

## 2017-07-29 DIAGNOSIS — S065X0D Traumatic subdural hemorrhage without loss of consciousness, subsequent encounter: Secondary | ICD-10-CM | POA: Diagnosis not present

## 2017-07-29 DIAGNOSIS — R634 Abnormal weight loss: Secondary | ICD-10-CM | POA: Diagnosis not present

## 2017-08-01 DIAGNOSIS — F0150 Vascular dementia without behavioral disturbance: Secondary | ICD-10-CM | POA: Diagnosis not present

## 2017-08-01 DIAGNOSIS — R634 Abnormal weight loss: Secondary | ICD-10-CM | POA: Diagnosis not present

## 2017-08-01 DIAGNOSIS — I69318 Other symptoms and signs involving cognitive functions following cerebral infarction: Secondary | ICD-10-CM | POA: Diagnosis not present

## 2017-08-01 DIAGNOSIS — I739 Peripheral vascular disease, unspecified: Secondary | ICD-10-CM | POA: Diagnosis not present

## 2017-08-01 DIAGNOSIS — S065X0D Traumatic subdural hemorrhage without loss of consciousness, subsequent encounter: Secondary | ICD-10-CM | POA: Diagnosis not present

## 2017-08-01 DIAGNOSIS — I10 Essential (primary) hypertension: Secondary | ICD-10-CM | POA: Diagnosis not present

## 2017-08-02 DIAGNOSIS — F0150 Vascular dementia without behavioral disturbance: Secondary | ICD-10-CM | POA: Diagnosis not present

## 2017-08-02 DIAGNOSIS — S065X0D Traumatic subdural hemorrhage without loss of consciousness, subsequent encounter: Secondary | ICD-10-CM | POA: Diagnosis not present

## 2017-08-02 DIAGNOSIS — I10 Essential (primary) hypertension: Secondary | ICD-10-CM | POA: Diagnosis not present

## 2017-08-02 DIAGNOSIS — I69318 Other symptoms and signs involving cognitive functions following cerebral infarction: Secondary | ICD-10-CM | POA: Diagnosis not present

## 2017-08-02 DIAGNOSIS — R634 Abnormal weight loss: Secondary | ICD-10-CM | POA: Diagnosis not present

## 2017-08-02 DIAGNOSIS — I739 Peripheral vascular disease, unspecified: Secondary | ICD-10-CM | POA: Diagnosis not present

## 2017-08-03 DIAGNOSIS — I739 Peripheral vascular disease, unspecified: Secondary | ICD-10-CM | POA: Diagnosis not present

## 2017-08-03 DIAGNOSIS — F0150 Vascular dementia without behavioral disturbance: Secondary | ICD-10-CM | POA: Diagnosis not present

## 2017-08-03 DIAGNOSIS — S065X0D Traumatic subdural hemorrhage without loss of consciousness, subsequent encounter: Secondary | ICD-10-CM | POA: Diagnosis not present

## 2017-08-03 DIAGNOSIS — R634 Abnormal weight loss: Secondary | ICD-10-CM | POA: Diagnosis not present

## 2017-08-03 DIAGNOSIS — I10 Essential (primary) hypertension: Secondary | ICD-10-CM | POA: Diagnosis not present

## 2017-08-03 DIAGNOSIS — I69318 Other symptoms and signs involving cognitive functions following cerebral infarction: Secondary | ICD-10-CM | POA: Diagnosis not present

## 2017-08-04 DIAGNOSIS — I69318 Other symptoms and signs involving cognitive functions following cerebral infarction: Secondary | ICD-10-CM | POA: Diagnosis not present

## 2017-08-04 DIAGNOSIS — I10 Essential (primary) hypertension: Secondary | ICD-10-CM | POA: Diagnosis not present

## 2017-08-04 DIAGNOSIS — I739 Peripheral vascular disease, unspecified: Secondary | ICD-10-CM | POA: Diagnosis not present

## 2017-08-04 DIAGNOSIS — F0150 Vascular dementia without behavioral disturbance: Secondary | ICD-10-CM | POA: Diagnosis not present

## 2017-08-04 DIAGNOSIS — R634 Abnormal weight loss: Secondary | ICD-10-CM | POA: Diagnosis not present

## 2017-08-04 DIAGNOSIS — S065X0D Traumatic subdural hemorrhage without loss of consciousness, subsequent encounter: Secondary | ICD-10-CM | POA: Diagnosis not present

## 2017-08-05 DIAGNOSIS — S065X0D Traumatic subdural hemorrhage without loss of consciousness, subsequent encounter: Secondary | ICD-10-CM | POA: Diagnosis not present

## 2017-08-05 DIAGNOSIS — R634 Abnormal weight loss: Secondary | ICD-10-CM | POA: Diagnosis not present

## 2017-08-05 DIAGNOSIS — I69318 Other symptoms and signs involving cognitive functions following cerebral infarction: Secondary | ICD-10-CM | POA: Diagnosis not present

## 2017-08-05 DIAGNOSIS — F0150 Vascular dementia without behavioral disturbance: Secondary | ICD-10-CM | POA: Diagnosis not present

## 2017-08-05 DIAGNOSIS — I739 Peripheral vascular disease, unspecified: Secondary | ICD-10-CM | POA: Diagnosis not present

## 2017-08-05 DIAGNOSIS — I10 Essential (primary) hypertension: Secondary | ICD-10-CM | POA: Diagnosis not present

## 2017-08-08 DIAGNOSIS — F0150 Vascular dementia without behavioral disturbance: Secondary | ICD-10-CM | POA: Diagnosis not present

## 2017-08-08 DIAGNOSIS — I10 Essential (primary) hypertension: Secondary | ICD-10-CM | POA: Diagnosis not present

## 2017-08-08 DIAGNOSIS — S065X0D Traumatic subdural hemorrhage without loss of consciousness, subsequent encounter: Secondary | ICD-10-CM | POA: Diagnosis not present

## 2017-08-08 DIAGNOSIS — I739 Peripheral vascular disease, unspecified: Secondary | ICD-10-CM | POA: Diagnosis not present

## 2017-08-08 DIAGNOSIS — I69318 Other symptoms and signs involving cognitive functions following cerebral infarction: Secondary | ICD-10-CM | POA: Diagnosis not present

## 2017-08-08 DIAGNOSIS — R634 Abnormal weight loss: Secondary | ICD-10-CM | POA: Diagnosis not present

## 2017-08-09 DIAGNOSIS — I10 Essential (primary) hypertension: Secondary | ICD-10-CM | POA: Diagnosis not present

## 2017-08-09 DIAGNOSIS — I69318 Other symptoms and signs involving cognitive functions following cerebral infarction: Secondary | ICD-10-CM | POA: Diagnosis not present

## 2017-08-09 DIAGNOSIS — F0150 Vascular dementia without behavioral disturbance: Secondary | ICD-10-CM | POA: Diagnosis not present

## 2017-08-09 DIAGNOSIS — R634 Abnormal weight loss: Secondary | ICD-10-CM | POA: Diagnosis not present

## 2017-08-09 DIAGNOSIS — I739 Peripheral vascular disease, unspecified: Secondary | ICD-10-CM | POA: Diagnosis not present

## 2017-08-09 DIAGNOSIS — S065X0D Traumatic subdural hemorrhage without loss of consciousness, subsequent encounter: Secondary | ICD-10-CM | POA: Diagnosis not present

## 2017-08-10 DIAGNOSIS — S065X0D Traumatic subdural hemorrhage without loss of consciousness, subsequent encounter: Secondary | ICD-10-CM | POA: Diagnosis not present

## 2017-08-10 DIAGNOSIS — I739 Peripheral vascular disease, unspecified: Secondary | ICD-10-CM | POA: Diagnosis not present

## 2017-08-10 DIAGNOSIS — I69318 Other symptoms and signs involving cognitive functions following cerebral infarction: Secondary | ICD-10-CM | POA: Diagnosis not present

## 2017-08-10 DIAGNOSIS — R634 Abnormal weight loss: Secondary | ICD-10-CM | POA: Diagnosis not present

## 2017-08-10 DIAGNOSIS — F0150 Vascular dementia without behavioral disturbance: Secondary | ICD-10-CM | POA: Diagnosis not present

## 2017-08-10 DIAGNOSIS — I10 Essential (primary) hypertension: Secondary | ICD-10-CM | POA: Diagnosis not present

## 2017-08-11 DIAGNOSIS — I739 Peripheral vascular disease, unspecified: Secondary | ICD-10-CM | POA: Diagnosis not present

## 2017-08-11 DIAGNOSIS — F0150 Vascular dementia without behavioral disturbance: Secondary | ICD-10-CM | POA: Diagnosis not present

## 2017-08-11 DIAGNOSIS — S065X0D Traumatic subdural hemorrhage without loss of consciousness, subsequent encounter: Secondary | ICD-10-CM | POA: Diagnosis not present

## 2017-08-11 DIAGNOSIS — I10 Essential (primary) hypertension: Secondary | ICD-10-CM | POA: Diagnosis not present

## 2017-08-11 DIAGNOSIS — I69318 Other symptoms and signs involving cognitive functions following cerebral infarction: Secondary | ICD-10-CM | POA: Diagnosis not present

## 2017-08-11 DIAGNOSIS — R634 Abnormal weight loss: Secondary | ICD-10-CM | POA: Diagnosis not present

## 2017-08-12 DIAGNOSIS — I69318 Other symptoms and signs involving cognitive functions following cerebral infarction: Secondary | ICD-10-CM | POA: Diagnosis not present

## 2017-08-12 DIAGNOSIS — F0150 Vascular dementia without behavioral disturbance: Secondary | ICD-10-CM | POA: Diagnosis not present

## 2017-08-12 DIAGNOSIS — R634 Abnormal weight loss: Secondary | ICD-10-CM | POA: Diagnosis not present

## 2017-08-12 DIAGNOSIS — I10 Essential (primary) hypertension: Secondary | ICD-10-CM | POA: Diagnosis not present

## 2017-08-12 DIAGNOSIS — I739 Peripheral vascular disease, unspecified: Secondary | ICD-10-CM | POA: Diagnosis not present

## 2017-08-12 DIAGNOSIS — S065X0D Traumatic subdural hemorrhage without loss of consciousness, subsequent encounter: Secondary | ICD-10-CM | POA: Diagnosis not present

## 2017-08-15 DIAGNOSIS — F0150 Vascular dementia without behavioral disturbance: Secondary | ICD-10-CM | POA: Diagnosis not present

## 2017-08-15 DIAGNOSIS — I69318 Other symptoms and signs involving cognitive functions following cerebral infarction: Secondary | ICD-10-CM | POA: Diagnosis not present

## 2017-08-15 DIAGNOSIS — I10 Essential (primary) hypertension: Secondary | ICD-10-CM | POA: Diagnosis not present

## 2017-08-15 DIAGNOSIS — I739 Peripheral vascular disease, unspecified: Secondary | ICD-10-CM | POA: Diagnosis not present

## 2017-08-15 DIAGNOSIS — R634 Abnormal weight loss: Secondary | ICD-10-CM | POA: Diagnosis not present

## 2017-08-15 DIAGNOSIS — S065X0D Traumatic subdural hemorrhage without loss of consciousness, subsequent encounter: Secondary | ICD-10-CM | POA: Diagnosis not present

## 2017-08-16 DIAGNOSIS — I10 Essential (primary) hypertension: Secondary | ICD-10-CM | POA: Diagnosis not present

## 2017-08-16 DIAGNOSIS — L988 Other specified disorders of the skin and subcutaneous tissue: Secondary | ICD-10-CM | POA: Diagnosis not present

## 2017-08-16 DIAGNOSIS — Z79899 Other long term (current) drug therapy: Secondary | ICD-10-CM | POA: Diagnosis not present

## 2017-08-16 DIAGNOSIS — R1319 Other dysphagia: Secondary | ICD-10-CM | POA: Diagnosis not present

## 2017-08-16 DIAGNOSIS — I69318 Other symptoms and signs involving cognitive functions following cerebral infarction: Secondary | ICD-10-CM | POA: Diagnosis not present

## 2017-08-16 DIAGNOSIS — S065X0D Traumatic subdural hemorrhage without loss of consciousness, subsequent encounter: Secondary | ICD-10-CM | POA: Diagnosis not present

## 2017-08-16 DIAGNOSIS — R54 Age-related physical debility: Secondary | ICD-10-CM | POA: Diagnosis not present

## 2017-08-16 DIAGNOSIS — R634 Abnormal weight loss: Secondary | ICD-10-CM | POA: Diagnosis not present

## 2017-08-16 DIAGNOSIS — I739 Peripheral vascular disease, unspecified: Secondary | ICD-10-CM | POA: Diagnosis not present

## 2017-08-16 DIAGNOSIS — R636 Underweight: Secondary | ICD-10-CM | POA: Diagnosis not present

## 2017-08-16 DIAGNOSIS — G308 Other Alzheimer's disease: Secondary | ICD-10-CM | POA: Diagnosis not present

## 2017-08-16 DIAGNOSIS — F0150 Vascular dementia without behavioral disturbance: Secondary | ICD-10-CM | POA: Diagnosis not present

## 2017-08-17 DIAGNOSIS — S065X0D Traumatic subdural hemorrhage without loss of consciousness, subsequent encounter: Secondary | ICD-10-CM | POA: Diagnosis not present

## 2017-08-17 DIAGNOSIS — R634 Abnormal weight loss: Secondary | ICD-10-CM | POA: Diagnosis not present

## 2017-08-17 DIAGNOSIS — I739 Peripheral vascular disease, unspecified: Secondary | ICD-10-CM | POA: Diagnosis not present

## 2017-08-17 DIAGNOSIS — I10 Essential (primary) hypertension: Secondary | ICD-10-CM | POA: Diagnosis not present

## 2017-08-17 DIAGNOSIS — F0150 Vascular dementia without behavioral disturbance: Secondary | ICD-10-CM | POA: Diagnosis not present

## 2017-08-17 DIAGNOSIS — I69318 Other symptoms and signs involving cognitive functions following cerebral infarction: Secondary | ICD-10-CM | POA: Diagnosis not present

## 2017-08-18 DIAGNOSIS — S065X0D Traumatic subdural hemorrhage without loss of consciousness, subsequent encounter: Secondary | ICD-10-CM | POA: Diagnosis not present

## 2017-08-18 DIAGNOSIS — I739 Peripheral vascular disease, unspecified: Secondary | ICD-10-CM | POA: Diagnosis not present

## 2017-08-18 DIAGNOSIS — I10 Essential (primary) hypertension: Secondary | ICD-10-CM | POA: Diagnosis not present

## 2017-08-18 DIAGNOSIS — I69318 Other symptoms and signs involving cognitive functions following cerebral infarction: Secondary | ICD-10-CM | POA: Diagnosis not present

## 2017-08-18 DIAGNOSIS — R634 Abnormal weight loss: Secondary | ICD-10-CM | POA: Diagnosis not present

## 2017-08-18 DIAGNOSIS — F0150 Vascular dementia without behavioral disturbance: Secondary | ICD-10-CM | POA: Diagnosis not present

## 2017-08-19 DIAGNOSIS — I739 Peripheral vascular disease, unspecified: Secondary | ICD-10-CM | POA: Diagnosis not present

## 2017-08-19 DIAGNOSIS — R634 Abnormal weight loss: Secondary | ICD-10-CM | POA: Diagnosis not present

## 2017-08-19 DIAGNOSIS — S065X0D Traumatic subdural hemorrhage without loss of consciousness, subsequent encounter: Secondary | ICD-10-CM | POA: Diagnosis not present

## 2017-08-19 DIAGNOSIS — F0150 Vascular dementia without behavioral disturbance: Secondary | ICD-10-CM | POA: Diagnosis not present

## 2017-08-19 DIAGNOSIS — I69318 Other symptoms and signs involving cognitive functions following cerebral infarction: Secondary | ICD-10-CM | POA: Diagnosis not present

## 2017-08-19 DIAGNOSIS — I10 Essential (primary) hypertension: Secondary | ICD-10-CM | POA: Diagnosis not present

## 2017-08-22 DIAGNOSIS — R634 Abnormal weight loss: Secondary | ICD-10-CM | POA: Diagnosis not present

## 2017-08-22 DIAGNOSIS — I10 Essential (primary) hypertension: Secondary | ICD-10-CM | POA: Diagnosis not present

## 2017-08-22 DIAGNOSIS — I739 Peripheral vascular disease, unspecified: Secondary | ICD-10-CM | POA: Diagnosis not present

## 2017-08-22 DIAGNOSIS — I69318 Other symptoms and signs involving cognitive functions following cerebral infarction: Secondary | ICD-10-CM | POA: Diagnosis not present

## 2017-08-22 DIAGNOSIS — S065X0D Traumatic subdural hemorrhage without loss of consciousness, subsequent encounter: Secondary | ICD-10-CM | POA: Diagnosis not present

## 2017-08-22 DIAGNOSIS — F0150 Vascular dementia without behavioral disturbance: Secondary | ICD-10-CM | POA: Diagnosis not present

## 2017-08-23 DIAGNOSIS — R2689 Other abnormalities of gait and mobility: Secondary | ICD-10-CM | POA: Diagnosis not present

## 2017-08-23 DIAGNOSIS — I6389 Other cerebral infarction: Secondary | ICD-10-CM | POA: Diagnosis not present

## 2017-08-23 DIAGNOSIS — F0150 Vascular dementia without behavioral disturbance: Secondary | ICD-10-CM | POA: Diagnosis not present

## 2017-08-23 DIAGNOSIS — R634 Abnormal weight loss: Secondary | ICD-10-CM | POA: Diagnosis not present

## 2017-08-23 DIAGNOSIS — I69318 Other symptoms and signs involving cognitive functions following cerebral infarction: Secondary | ICD-10-CM | POA: Diagnosis not present

## 2017-08-23 DIAGNOSIS — S065X0D Traumatic subdural hemorrhage without loss of consciousness, subsequent encounter: Secondary | ICD-10-CM | POA: Diagnosis not present

## 2017-08-23 DIAGNOSIS — M1990 Unspecified osteoarthritis, unspecified site: Secondary | ICD-10-CM | POA: Diagnosis not present

## 2017-08-23 DIAGNOSIS — R636 Underweight: Secondary | ICD-10-CM | POA: Diagnosis not present

## 2017-08-23 DIAGNOSIS — Z79899 Other long term (current) drug therapy: Secondary | ICD-10-CM | POA: Diagnosis not present

## 2017-08-23 DIAGNOSIS — I10 Essential (primary) hypertension: Secondary | ICD-10-CM | POA: Diagnosis not present

## 2017-08-23 DIAGNOSIS — I739 Peripheral vascular disease, unspecified: Secondary | ICD-10-CM | POA: Diagnosis not present

## 2017-08-24 DIAGNOSIS — I739 Peripheral vascular disease, unspecified: Secondary | ICD-10-CM | POA: Diagnosis not present

## 2017-08-24 DIAGNOSIS — S065X0D Traumatic subdural hemorrhage without loss of consciousness, subsequent encounter: Secondary | ICD-10-CM | POA: Diagnosis not present

## 2017-08-24 DIAGNOSIS — R634 Abnormal weight loss: Secondary | ICD-10-CM | POA: Diagnosis not present

## 2017-08-24 DIAGNOSIS — I10 Essential (primary) hypertension: Secondary | ICD-10-CM | POA: Diagnosis not present

## 2017-08-24 DIAGNOSIS — I69318 Other symptoms and signs involving cognitive functions following cerebral infarction: Secondary | ICD-10-CM | POA: Diagnosis not present

## 2017-08-24 DIAGNOSIS — F0150 Vascular dementia without behavioral disturbance: Secondary | ICD-10-CM | POA: Diagnosis not present

## 2017-08-25 DIAGNOSIS — R011 Cardiac murmur, unspecified: Secondary | ICD-10-CM | POA: Diagnosis not present

## 2017-08-25 DIAGNOSIS — I739 Peripheral vascular disease, unspecified: Secondary | ICD-10-CM | POA: Diagnosis not present

## 2017-08-25 DIAGNOSIS — S065X0D Traumatic subdural hemorrhage without loss of consciousness, subsequent encounter: Secondary | ICD-10-CM | POA: Diagnosis not present

## 2017-08-25 DIAGNOSIS — F0150 Vascular dementia without behavioral disturbance: Secondary | ICD-10-CM | POA: Diagnosis not present

## 2017-08-25 DIAGNOSIS — K5909 Other constipation: Secondary | ICD-10-CM | POA: Diagnosis not present

## 2017-08-25 DIAGNOSIS — I10 Essential (primary) hypertension: Secondary | ICD-10-CM | POA: Diagnosis not present

## 2017-08-25 DIAGNOSIS — I69318 Other symptoms and signs involving cognitive functions following cerebral infarction: Secondary | ICD-10-CM | POA: Diagnosis not present

## 2017-08-25 DIAGNOSIS — Z79899 Other long term (current) drug therapy: Secondary | ICD-10-CM | POA: Diagnosis not present

## 2017-08-25 DIAGNOSIS — I6529 Occlusion and stenosis of unspecified carotid artery: Secondary | ICD-10-CM | POA: Diagnosis not present

## 2017-08-25 DIAGNOSIS — R636 Underweight: Secondary | ICD-10-CM | POA: Diagnosis not present

## 2017-08-25 DIAGNOSIS — H109 Unspecified conjunctivitis: Secondary | ICD-10-CM | POA: Diagnosis not present

## 2017-08-25 DIAGNOSIS — R634 Abnormal weight loss: Secondary | ICD-10-CM | POA: Diagnosis not present

## 2017-08-26 DIAGNOSIS — R634 Abnormal weight loss: Secondary | ICD-10-CM | POA: Diagnosis not present

## 2017-08-26 DIAGNOSIS — I10 Essential (primary) hypertension: Secondary | ICD-10-CM | POA: Diagnosis not present

## 2017-08-26 DIAGNOSIS — I69318 Other symptoms and signs involving cognitive functions following cerebral infarction: Secondary | ICD-10-CM | POA: Diagnosis not present

## 2017-08-26 DIAGNOSIS — I739 Peripheral vascular disease, unspecified: Secondary | ICD-10-CM | POA: Diagnosis not present

## 2017-08-26 DIAGNOSIS — S065X0D Traumatic subdural hemorrhage without loss of consciousness, subsequent encounter: Secondary | ICD-10-CM | POA: Diagnosis not present

## 2017-08-26 DIAGNOSIS — F0150 Vascular dementia without behavioral disturbance: Secondary | ICD-10-CM | POA: Diagnosis not present

## 2017-08-27 DIAGNOSIS — I10 Essential (primary) hypertension: Secondary | ICD-10-CM | POA: Diagnosis not present

## 2017-08-27 DIAGNOSIS — Z681 Body mass index (BMI) 19 or less, adult: Secondary | ICD-10-CM | POA: Diagnosis not present

## 2017-08-27 DIAGNOSIS — R55 Syncope and collapse: Secondary | ICD-10-CM | POA: Diagnosis not present

## 2017-08-27 DIAGNOSIS — I6529 Occlusion and stenosis of unspecified carotid artery: Secondary | ICD-10-CM | POA: Diagnosis not present

## 2017-08-27 DIAGNOSIS — I69318 Other symptoms and signs involving cognitive functions following cerebral infarction: Secondary | ICD-10-CM | POA: Diagnosis not present

## 2017-08-27 DIAGNOSIS — I739 Peripheral vascular disease, unspecified: Secondary | ICD-10-CM | POA: Diagnosis not present

## 2017-08-27 DIAGNOSIS — M1991 Primary osteoarthritis, unspecified site: Secondary | ICD-10-CM | POA: Diagnosis not present

## 2017-08-27 DIAGNOSIS — R64 Cachexia: Secondary | ICD-10-CM | POA: Diagnosis not present

## 2017-08-27 DIAGNOSIS — S065X0D Traumatic subdural hemorrhage without loss of consciousness, subsequent encounter: Secondary | ICD-10-CM | POA: Diagnosis not present

## 2017-08-27 DIAGNOSIS — R627 Adult failure to thrive: Secondary | ICD-10-CM | POA: Diagnosis not present

## 2017-08-27 DIAGNOSIS — R634 Abnormal weight loss: Secondary | ICD-10-CM | POA: Diagnosis not present

## 2017-08-27 DIAGNOSIS — F0150 Vascular dementia without behavioral disturbance: Secondary | ICD-10-CM | POA: Diagnosis not present

## 2017-08-27 DIAGNOSIS — E785 Hyperlipidemia, unspecified: Secondary | ICD-10-CM | POA: Diagnosis not present

## 2017-08-27 DIAGNOSIS — Z9181 History of falling: Secondary | ICD-10-CM | POA: Diagnosis not present

## 2017-08-27 DIAGNOSIS — K5909 Other constipation: Secondary | ICD-10-CM | POA: Diagnosis not present

## 2017-08-29 DIAGNOSIS — S065X0D Traumatic subdural hemorrhage without loss of consciousness, subsequent encounter: Secondary | ICD-10-CM | POA: Diagnosis not present

## 2017-08-29 DIAGNOSIS — I739 Peripheral vascular disease, unspecified: Secondary | ICD-10-CM | POA: Diagnosis not present

## 2017-08-29 DIAGNOSIS — R634 Abnormal weight loss: Secondary | ICD-10-CM | POA: Diagnosis not present

## 2017-08-29 DIAGNOSIS — F0150 Vascular dementia without behavioral disturbance: Secondary | ICD-10-CM | POA: Diagnosis not present

## 2017-08-29 DIAGNOSIS — I69318 Other symptoms and signs involving cognitive functions following cerebral infarction: Secondary | ICD-10-CM | POA: Diagnosis not present

## 2017-08-29 DIAGNOSIS — I10 Essential (primary) hypertension: Secondary | ICD-10-CM | POA: Diagnosis not present

## 2017-08-30 DIAGNOSIS — F0150 Vascular dementia without behavioral disturbance: Secondary | ICD-10-CM | POA: Diagnosis not present

## 2017-08-30 DIAGNOSIS — R634 Abnormal weight loss: Secondary | ICD-10-CM | POA: Diagnosis not present

## 2017-08-30 DIAGNOSIS — I739 Peripheral vascular disease, unspecified: Secondary | ICD-10-CM | POA: Diagnosis not present

## 2017-08-30 DIAGNOSIS — Z79899 Other long term (current) drug therapy: Secondary | ICD-10-CM | POA: Diagnosis not present

## 2017-08-30 DIAGNOSIS — G308 Other Alzheimer's disease: Secondary | ICD-10-CM | POA: Diagnosis not present

## 2017-08-30 DIAGNOSIS — I7389 Other specified peripheral vascular diseases: Secondary | ICD-10-CM | POA: Diagnosis not present

## 2017-08-30 DIAGNOSIS — D518 Other vitamin B12 deficiency anemias: Secondary | ICD-10-CM | POA: Diagnosis not present

## 2017-08-30 DIAGNOSIS — R636 Underweight: Secondary | ICD-10-CM | POA: Diagnosis not present

## 2017-08-30 DIAGNOSIS — I69318 Other symptoms and signs involving cognitive functions following cerebral infarction: Secondary | ICD-10-CM | POA: Diagnosis not present

## 2017-08-30 DIAGNOSIS — I10 Essential (primary) hypertension: Secondary | ICD-10-CM | POA: Diagnosis not present

## 2017-08-30 DIAGNOSIS — S065X0D Traumatic subdural hemorrhage without loss of consciousness, subsequent encounter: Secondary | ICD-10-CM | POA: Diagnosis not present

## 2017-08-30 DIAGNOSIS — H109 Unspecified conjunctivitis: Secondary | ICD-10-CM | POA: Diagnosis not present

## 2017-08-31 DIAGNOSIS — F0150 Vascular dementia without behavioral disturbance: Secondary | ICD-10-CM | POA: Diagnosis not present

## 2017-08-31 DIAGNOSIS — S065X0D Traumatic subdural hemorrhage without loss of consciousness, subsequent encounter: Secondary | ICD-10-CM | POA: Diagnosis not present

## 2017-08-31 DIAGNOSIS — I10 Essential (primary) hypertension: Secondary | ICD-10-CM | POA: Diagnosis not present

## 2017-08-31 DIAGNOSIS — R634 Abnormal weight loss: Secondary | ICD-10-CM | POA: Diagnosis not present

## 2017-08-31 DIAGNOSIS — I69318 Other symptoms and signs involving cognitive functions following cerebral infarction: Secondary | ICD-10-CM | POA: Diagnosis not present

## 2017-08-31 DIAGNOSIS — I739 Peripheral vascular disease, unspecified: Secondary | ICD-10-CM | POA: Diagnosis not present

## 2017-09-01 DIAGNOSIS — I739 Peripheral vascular disease, unspecified: Secondary | ICD-10-CM | POA: Diagnosis not present

## 2017-09-01 DIAGNOSIS — I10 Essential (primary) hypertension: Secondary | ICD-10-CM | POA: Diagnosis not present

## 2017-09-01 DIAGNOSIS — R634 Abnormal weight loss: Secondary | ICD-10-CM | POA: Diagnosis not present

## 2017-09-01 DIAGNOSIS — S065X0D Traumatic subdural hemorrhage without loss of consciousness, subsequent encounter: Secondary | ICD-10-CM | POA: Diagnosis not present

## 2017-09-01 DIAGNOSIS — I69318 Other symptoms and signs involving cognitive functions following cerebral infarction: Secondary | ICD-10-CM | POA: Diagnosis not present

## 2017-09-01 DIAGNOSIS — F0150 Vascular dementia without behavioral disturbance: Secondary | ICD-10-CM | POA: Diagnosis not present

## 2017-09-02 DIAGNOSIS — I739 Peripheral vascular disease, unspecified: Secondary | ICD-10-CM | POA: Diagnosis not present

## 2017-09-02 DIAGNOSIS — I69318 Other symptoms and signs involving cognitive functions following cerebral infarction: Secondary | ICD-10-CM | POA: Diagnosis not present

## 2017-09-02 DIAGNOSIS — I10 Essential (primary) hypertension: Secondary | ICD-10-CM | POA: Diagnosis not present

## 2017-09-02 DIAGNOSIS — R634 Abnormal weight loss: Secondary | ICD-10-CM | POA: Diagnosis not present

## 2017-09-02 DIAGNOSIS — F0150 Vascular dementia without behavioral disturbance: Secondary | ICD-10-CM | POA: Diagnosis not present

## 2017-09-02 DIAGNOSIS — S065X0D Traumatic subdural hemorrhage without loss of consciousness, subsequent encounter: Secondary | ICD-10-CM | POA: Diagnosis not present

## 2017-09-03 DIAGNOSIS — I69318 Other symptoms and signs involving cognitive functions following cerebral infarction: Secondary | ICD-10-CM | POA: Diagnosis not present

## 2017-09-03 DIAGNOSIS — I739 Peripheral vascular disease, unspecified: Secondary | ICD-10-CM | POA: Diagnosis not present

## 2017-09-03 DIAGNOSIS — S065X0D Traumatic subdural hemorrhage without loss of consciousness, subsequent encounter: Secondary | ICD-10-CM | POA: Diagnosis not present

## 2017-09-03 DIAGNOSIS — R634 Abnormal weight loss: Secondary | ICD-10-CM | POA: Diagnosis not present

## 2017-09-03 DIAGNOSIS — I10 Essential (primary) hypertension: Secondary | ICD-10-CM | POA: Diagnosis not present

## 2017-09-03 DIAGNOSIS — F0150 Vascular dementia without behavioral disturbance: Secondary | ICD-10-CM | POA: Diagnosis not present

## 2017-09-05 DIAGNOSIS — R634 Abnormal weight loss: Secondary | ICD-10-CM | POA: Diagnosis not present

## 2017-09-05 DIAGNOSIS — S065X0D Traumatic subdural hemorrhage without loss of consciousness, subsequent encounter: Secondary | ICD-10-CM | POA: Diagnosis not present

## 2017-09-05 DIAGNOSIS — I739 Peripheral vascular disease, unspecified: Secondary | ICD-10-CM | POA: Diagnosis not present

## 2017-09-05 DIAGNOSIS — I69318 Other symptoms and signs involving cognitive functions following cerebral infarction: Secondary | ICD-10-CM | POA: Diagnosis not present

## 2017-09-05 DIAGNOSIS — F0150 Vascular dementia without behavioral disturbance: Secondary | ICD-10-CM | POA: Diagnosis not present

## 2017-09-05 DIAGNOSIS — I10 Essential (primary) hypertension: Secondary | ICD-10-CM | POA: Diagnosis not present

## 2017-09-06 DIAGNOSIS — F0150 Vascular dementia without behavioral disturbance: Secondary | ICD-10-CM | POA: Diagnosis not present

## 2017-09-06 DIAGNOSIS — S065X0D Traumatic subdural hemorrhage without loss of consciousness, subsequent encounter: Secondary | ICD-10-CM | POA: Diagnosis not present

## 2017-09-06 DIAGNOSIS — R634 Abnormal weight loss: Secondary | ICD-10-CM | POA: Diagnosis not present

## 2017-09-06 DIAGNOSIS — I739 Peripheral vascular disease, unspecified: Secondary | ICD-10-CM | POA: Diagnosis not present

## 2017-09-06 DIAGNOSIS — I10 Essential (primary) hypertension: Secondary | ICD-10-CM | POA: Diagnosis not present

## 2017-09-06 DIAGNOSIS — I69318 Other symptoms and signs involving cognitive functions following cerebral infarction: Secondary | ICD-10-CM | POA: Diagnosis not present

## 2017-09-07 DIAGNOSIS — I10 Essential (primary) hypertension: Secondary | ICD-10-CM | POA: Diagnosis not present

## 2017-09-07 DIAGNOSIS — F0150 Vascular dementia without behavioral disturbance: Secondary | ICD-10-CM | POA: Diagnosis not present

## 2017-09-07 DIAGNOSIS — I69318 Other symptoms and signs involving cognitive functions following cerebral infarction: Secondary | ICD-10-CM | POA: Diagnosis not present

## 2017-09-07 DIAGNOSIS — I739 Peripheral vascular disease, unspecified: Secondary | ICD-10-CM | POA: Diagnosis not present

## 2017-09-07 DIAGNOSIS — R634 Abnormal weight loss: Secondary | ICD-10-CM | POA: Diagnosis not present

## 2017-09-07 DIAGNOSIS — S065X0D Traumatic subdural hemorrhage without loss of consciousness, subsequent encounter: Secondary | ICD-10-CM | POA: Diagnosis not present

## 2017-09-07 DIAGNOSIS — B351 Tinea unguium: Secondary | ICD-10-CM | POA: Diagnosis not present

## 2017-09-07 DIAGNOSIS — M2040 Other hammer toe(s) (acquired), unspecified foot: Secondary | ICD-10-CM | POA: Diagnosis not present

## 2017-09-07 DIAGNOSIS — M2010 Hallux valgus (acquired), unspecified foot: Secondary | ICD-10-CM | POA: Diagnosis not present

## 2017-09-08 DIAGNOSIS — S065X0D Traumatic subdural hemorrhage without loss of consciousness, subsequent encounter: Secondary | ICD-10-CM | POA: Diagnosis not present

## 2017-09-08 DIAGNOSIS — I739 Peripheral vascular disease, unspecified: Secondary | ICD-10-CM | POA: Diagnosis not present

## 2017-09-08 DIAGNOSIS — F0150 Vascular dementia without behavioral disturbance: Secondary | ICD-10-CM | POA: Diagnosis not present

## 2017-09-08 DIAGNOSIS — I10 Essential (primary) hypertension: Secondary | ICD-10-CM | POA: Diagnosis not present

## 2017-09-08 DIAGNOSIS — R634 Abnormal weight loss: Secondary | ICD-10-CM | POA: Diagnosis not present

## 2017-09-08 DIAGNOSIS — I69318 Other symptoms and signs involving cognitive functions following cerebral infarction: Secondary | ICD-10-CM | POA: Diagnosis not present

## 2017-09-09 DIAGNOSIS — I10 Essential (primary) hypertension: Secondary | ICD-10-CM | POA: Diagnosis not present

## 2017-09-09 DIAGNOSIS — I69318 Other symptoms and signs involving cognitive functions following cerebral infarction: Secondary | ICD-10-CM | POA: Diagnosis not present

## 2017-09-09 DIAGNOSIS — R634 Abnormal weight loss: Secondary | ICD-10-CM | POA: Diagnosis not present

## 2017-09-09 DIAGNOSIS — I739 Peripheral vascular disease, unspecified: Secondary | ICD-10-CM | POA: Diagnosis not present

## 2017-09-09 DIAGNOSIS — S065X0D Traumatic subdural hemorrhage without loss of consciousness, subsequent encounter: Secondary | ICD-10-CM | POA: Diagnosis not present

## 2017-09-09 DIAGNOSIS — F0150 Vascular dementia without behavioral disturbance: Secondary | ICD-10-CM | POA: Diagnosis not present

## 2017-09-12 DIAGNOSIS — I10 Essential (primary) hypertension: Secondary | ICD-10-CM | POA: Diagnosis not present

## 2017-09-12 DIAGNOSIS — F0150 Vascular dementia without behavioral disturbance: Secondary | ICD-10-CM | POA: Diagnosis not present

## 2017-09-12 DIAGNOSIS — S065X0D Traumatic subdural hemorrhage without loss of consciousness, subsequent encounter: Secondary | ICD-10-CM | POA: Diagnosis not present

## 2017-09-12 DIAGNOSIS — R634 Abnormal weight loss: Secondary | ICD-10-CM | POA: Diagnosis not present

## 2017-09-12 DIAGNOSIS — I739 Peripheral vascular disease, unspecified: Secondary | ICD-10-CM | POA: Diagnosis not present

## 2017-09-12 DIAGNOSIS — I69318 Other symptoms and signs involving cognitive functions following cerebral infarction: Secondary | ICD-10-CM | POA: Diagnosis not present

## 2017-09-13 DIAGNOSIS — F0150 Vascular dementia without behavioral disturbance: Secondary | ICD-10-CM | POA: Diagnosis not present

## 2017-09-13 DIAGNOSIS — M1990 Unspecified osteoarthritis, unspecified site: Secondary | ICD-10-CM | POA: Diagnosis not present

## 2017-09-13 DIAGNOSIS — R634 Abnormal weight loss: Secondary | ICD-10-CM | POA: Diagnosis not present

## 2017-09-13 DIAGNOSIS — R2689 Other abnormalities of gait and mobility: Secondary | ICD-10-CM | POA: Diagnosis not present

## 2017-09-13 DIAGNOSIS — G308 Other Alzheimer's disease: Secondary | ICD-10-CM | POA: Diagnosis not present

## 2017-09-13 DIAGNOSIS — K5900 Constipation, unspecified: Secondary | ICD-10-CM | POA: Diagnosis not present

## 2017-09-13 DIAGNOSIS — I69318 Other symptoms and signs involving cognitive functions following cerebral infarction: Secondary | ICD-10-CM | POA: Diagnosis not present

## 2017-09-13 DIAGNOSIS — S065X0D Traumatic subdural hemorrhage without loss of consciousness, subsequent encounter: Secondary | ICD-10-CM | POA: Diagnosis not present

## 2017-09-13 DIAGNOSIS — I10 Essential (primary) hypertension: Secondary | ICD-10-CM | POA: Diagnosis not present

## 2017-09-13 DIAGNOSIS — I739 Peripheral vascular disease, unspecified: Secondary | ICD-10-CM | POA: Diagnosis not present

## 2017-09-14 DIAGNOSIS — I69318 Other symptoms and signs involving cognitive functions following cerebral infarction: Secondary | ICD-10-CM | POA: Diagnosis not present

## 2017-09-14 DIAGNOSIS — I10 Essential (primary) hypertension: Secondary | ICD-10-CM | POA: Diagnosis not present

## 2017-09-14 DIAGNOSIS — I739 Peripheral vascular disease, unspecified: Secondary | ICD-10-CM | POA: Diagnosis not present

## 2017-09-14 DIAGNOSIS — S065X0D Traumatic subdural hemorrhage without loss of consciousness, subsequent encounter: Secondary | ICD-10-CM | POA: Diagnosis not present

## 2017-09-14 DIAGNOSIS — R634 Abnormal weight loss: Secondary | ICD-10-CM | POA: Diagnosis not present

## 2017-09-14 DIAGNOSIS — F0150 Vascular dementia without behavioral disturbance: Secondary | ICD-10-CM | POA: Diagnosis not present

## 2017-09-15 DIAGNOSIS — I739 Peripheral vascular disease, unspecified: Secondary | ICD-10-CM | POA: Diagnosis not present

## 2017-09-15 DIAGNOSIS — R634 Abnormal weight loss: Secondary | ICD-10-CM | POA: Diagnosis not present

## 2017-09-15 DIAGNOSIS — I10 Essential (primary) hypertension: Secondary | ICD-10-CM | POA: Diagnosis not present

## 2017-09-15 DIAGNOSIS — F0150 Vascular dementia without behavioral disturbance: Secondary | ICD-10-CM | POA: Diagnosis not present

## 2017-09-15 DIAGNOSIS — I69318 Other symptoms and signs involving cognitive functions following cerebral infarction: Secondary | ICD-10-CM | POA: Diagnosis not present

## 2017-09-15 DIAGNOSIS — S065X0D Traumatic subdural hemorrhage without loss of consciousness, subsequent encounter: Secondary | ICD-10-CM | POA: Diagnosis not present

## 2017-09-16 DIAGNOSIS — I739 Peripheral vascular disease, unspecified: Secondary | ICD-10-CM | POA: Diagnosis not present

## 2017-09-16 DIAGNOSIS — I10 Essential (primary) hypertension: Secondary | ICD-10-CM | POA: Diagnosis not present

## 2017-09-16 DIAGNOSIS — S065X0D Traumatic subdural hemorrhage without loss of consciousness, subsequent encounter: Secondary | ICD-10-CM | POA: Diagnosis not present

## 2017-09-16 DIAGNOSIS — I69318 Other symptoms and signs involving cognitive functions following cerebral infarction: Secondary | ICD-10-CM | POA: Diagnosis not present

## 2017-09-16 DIAGNOSIS — R634 Abnormal weight loss: Secondary | ICD-10-CM | POA: Diagnosis not present

## 2017-09-16 DIAGNOSIS — F0150 Vascular dementia without behavioral disturbance: Secondary | ICD-10-CM | POA: Diagnosis not present

## 2017-09-19 DIAGNOSIS — I739 Peripheral vascular disease, unspecified: Secondary | ICD-10-CM | POA: Diagnosis not present

## 2017-09-19 DIAGNOSIS — S065X0D Traumatic subdural hemorrhage without loss of consciousness, subsequent encounter: Secondary | ICD-10-CM | POA: Diagnosis not present

## 2017-09-19 DIAGNOSIS — I69318 Other symptoms and signs involving cognitive functions following cerebral infarction: Secondary | ICD-10-CM | POA: Diagnosis not present

## 2017-09-19 DIAGNOSIS — R634 Abnormal weight loss: Secondary | ICD-10-CM | POA: Diagnosis not present

## 2017-09-19 DIAGNOSIS — F0150 Vascular dementia without behavioral disturbance: Secondary | ICD-10-CM | POA: Diagnosis not present

## 2017-09-19 DIAGNOSIS — I10 Essential (primary) hypertension: Secondary | ICD-10-CM | POA: Diagnosis not present

## 2017-09-20 DIAGNOSIS — G308 Other Alzheimer's disease: Secondary | ICD-10-CM | POA: Diagnosis not present

## 2017-09-20 DIAGNOSIS — K5900 Constipation, unspecified: Secondary | ICD-10-CM | POA: Diagnosis not present

## 2017-09-20 DIAGNOSIS — I10 Essential (primary) hypertension: Secondary | ICD-10-CM | POA: Diagnosis not present

## 2017-09-20 DIAGNOSIS — S065X0D Traumatic subdural hemorrhage without loss of consciousness, subsequent encounter: Secondary | ICD-10-CM | POA: Diagnosis not present

## 2017-09-20 DIAGNOSIS — I739 Peripheral vascular disease, unspecified: Secondary | ICD-10-CM | POA: Diagnosis not present

## 2017-09-20 DIAGNOSIS — S81801A Unspecified open wound, right lower leg, initial encounter: Secondary | ICD-10-CM | POA: Diagnosis not present

## 2017-09-20 DIAGNOSIS — F0150 Vascular dementia without behavioral disturbance: Secondary | ICD-10-CM | POA: Diagnosis not present

## 2017-09-20 DIAGNOSIS — M1990 Unspecified osteoarthritis, unspecified site: Secondary | ICD-10-CM | POA: Diagnosis not present

## 2017-09-20 DIAGNOSIS — R634 Abnormal weight loss: Secondary | ICD-10-CM | POA: Diagnosis not present

## 2017-09-20 DIAGNOSIS — I69318 Other symptoms and signs involving cognitive functions following cerebral infarction: Secondary | ICD-10-CM | POA: Diagnosis not present

## 2017-09-21 DIAGNOSIS — R634 Abnormal weight loss: Secondary | ICD-10-CM | POA: Diagnosis not present

## 2017-09-21 DIAGNOSIS — I69318 Other symptoms and signs involving cognitive functions following cerebral infarction: Secondary | ICD-10-CM | POA: Diagnosis not present

## 2017-09-21 DIAGNOSIS — F0150 Vascular dementia without behavioral disturbance: Secondary | ICD-10-CM | POA: Diagnosis not present

## 2017-09-21 DIAGNOSIS — I10 Essential (primary) hypertension: Secondary | ICD-10-CM | POA: Diagnosis not present

## 2017-09-21 DIAGNOSIS — S065X0D Traumatic subdural hemorrhage without loss of consciousness, subsequent encounter: Secondary | ICD-10-CM | POA: Diagnosis not present

## 2017-09-21 DIAGNOSIS — I739 Peripheral vascular disease, unspecified: Secondary | ICD-10-CM | POA: Diagnosis not present

## 2017-09-22 DIAGNOSIS — I69318 Other symptoms and signs involving cognitive functions following cerebral infarction: Secondary | ICD-10-CM | POA: Diagnosis not present

## 2017-09-22 DIAGNOSIS — I739 Peripheral vascular disease, unspecified: Secondary | ICD-10-CM | POA: Diagnosis not present

## 2017-09-22 DIAGNOSIS — R634 Abnormal weight loss: Secondary | ICD-10-CM | POA: Diagnosis not present

## 2017-09-22 DIAGNOSIS — I10 Essential (primary) hypertension: Secondary | ICD-10-CM | POA: Diagnosis not present

## 2017-09-22 DIAGNOSIS — S065X0D Traumatic subdural hemorrhage without loss of consciousness, subsequent encounter: Secondary | ICD-10-CM | POA: Diagnosis not present

## 2017-09-22 DIAGNOSIS — F0150 Vascular dementia without behavioral disturbance: Secondary | ICD-10-CM | POA: Diagnosis not present

## 2017-09-23 DIAGNOSIS — I69318 Other symptoms and signs involving cognitive functions following cerebral infarction: Secondary | ICD-10-CM | POA: Diagnosis not present

## 2017-09-23 DIAGNOSIS — R634 Abnormal weight loss: Secondary | ICD-10-CM | POA: Diagnosis not present

## 2017-09-23 DIAGNOSIS — I10 Essential (primary) hypertension: Secondary | ICD-10-CM | POA: Diagnosis not present

## 2017-09-23 DIAGNOSIS — I739 Peripheral vascular disease, unspecified: Secondary | ICD-10-CM | POA: Diagnosis not present

## 2017-09-23 DIAGNOSIS — S065X0D Traumatic subdural hemorrhage without loss of consciousness, subsequent encounter: Secondary | ICD-10-CM | POA: Diagnosis not present

## 2017-09-23 DIAGNOSIS — F0150 Vascular dementia without behavioral disturbance: Secondary | ICD-10-CM | POA: Diagnosis not present

## 2017-09-24 DIAGNOSIS — S81801A Unspecified open wound, right lower leg, initial encounter: Secondary | ICD-10-CM | POA: Diagnosis not present

## 2017-09-24 DIAGNOSIS — K5900 Constipation, unspecified: Secondary | ICD-10-CM | POA: Diagnosis not present

## 2017-09-24 DIAGNOSIS — M1990 Unspecified osteoarthritis, unspecified site: Secondary | ICD-10-CM | POA: Diagnosis not present

## 2017-09-26 DIAGNOSIS — R627 Adult failure to thrive: Secondary | ICD-10-CM | POA: Diagnosis not present

## 2017-09-26 DIAGNOSIS — R64 Cachexia: Secondary | ICD-10-CM | POA: Diagnosis not present

## 2017-09-26 DIAGNOSIS — E785 Hyperlipidemia, unspecified: Secondary | ICD-10-CM | POA: Diagnosis not present

## 2017-09-26 DIAGNOSIS — K5909 Other constipation: Secondary | ICD-10-CM | POA: Diagnosis not present

## 2017-09-26 DIAGNOSIS — I10 Essential (primary) hypertension: Secondary | ICD-10-CM | POA: Diagnosis not present

## 2017-09-26 DIAGNOSIS — I69318 Other symptoms and signs involving cognitive functions following cerebral infarction: Secondary | ICD-10-CM | POA: Diagnosis not present

## 2017-09-26 DIAGNOSIS — R55 Syncope and collapse: Secondary | ICD-10-CM | POA: Diagnosis not present

## 2017-09-26 DIAGNOSIS — I6529 Occlusion and stenosis of unspecified carotid artery: Secondary | ICD-10-CM | POA: Diagnosis not present

## 2017-09-26 DIAGNOSIS — I739 Peripheral vascular disease, unspecified: Secondary | ICD-10-CM | POA: Diagnosis not present

## 2017-09-26 DIAGNOSIS — M1991 Primary osteoarthritis, unspecified site: Secondary | ICD-10-CM | POA: Diagnosis not present

## 2017-09-26 DIAGNOSIS — F0150 Vascular dementia without behavioral disturbance: Secondary | ICD-10-CM | POA: Diagnosis not present

## 2017-09-26 DIAGNOSIS — Z87820 Personal history of traumatic brain injury: Secondary | ICD-10-CM | POA: Diagnosis not present

## 2017-09-26 DIAGNOSIS — Z9181 History of falling: Secondary | ICD-10-CM | POA: Diagnosis not present

## 2017-09-26 DIAGNOSIS — R634 Abnormal weight loss: Secondary | ICD-10-CM | POA: Diagnosis not present

## 2017-09-26 DIAGNOSIS — Z681 Body mass index (BMI) 19 or less, adult: Secondary | ICD-10-CM | POA: Diagnosis not present

## 2017-09-27 DIAGNOSIS — I6529 Occlusion and stenosis of unspecified carotid artery: Secondary | ICD-10-CM | POA: Diagnosis not present

## 2017-09-27 DIAGNOSIS — F0150 Vascular dementia without behavioral disturbance: Secondary | ICD-10-CM | POA: Diagnosis not present

## 2017-09-27 DIAGNOSIS — R634 Abnormal weight loss: Secondary | ICD-10-CM | POA: Diagnosis not present

## 2017-09-27 DIAGNOSIS — R54 Age-related physical debility: Secondary | ICD-10-CM | POA: Diagnosis not present

## 2017-09-27 DIAGNOSIS — E440 Moderate protein-calorie malnutrition: Secondary | ICD-10-CM | POA: Diagnosis not present

## 2017-09-27 DIAGNOSIS — I10 Essential (primary) hypertension: Secondary | ICD-10-CM | POA: Diagnosis not present

## 2017-09-27 DIAGNOSIS — I69318 Other symptoms and signs involving cognitive functions following cerebral infarction: Secondary | ICD-10-CM | POA: Diagnosis not present

## 2017-09-27 DIAGNOSIS — I739 Peripheral vascular disease, unspecified: Secondary | ICD-10-CM | POA: Diagnosis not present

## 2017-09-27 DIAGNOSIS — Z8673 Personal history of transient ischemic attack (TIA), and cerebral infarction without residual deficits: Secondary | ICD-10-CM | POA: Diagnosis not present

## 2017-09-27 DIAGNOSIS — F0390 Unspecified dementia without behavioral disturbance: Secondary | ICD-10-CM | POA: Diagnosis not present

## 2017-09-28 DIAGNOSIS — F0150 Vascular dementia without behavioral disturbance: Secondary | ICD-10-CM | POA: Diagnosis not present

## 2017-09-28 DIAGNOSIS — R634 Abnormal weight loss: Secondary | ICD-10-CM | POA: Diagnosis not present

## 2017-09-28 DIAGNOSIS — I739 Peripheral vascular disease, unspecified: Secondary | ICD-10-CM | POA: Diagnosis not present

## 2017-09-28 DIAGNOSIS — I10 Essential (primary) hypertension: Secondary | ICD-10-CM | POA: Diagnosis not present

## 2017-09-28 DIAGNOSIS — I69318 Other symptoms and signs involving cognitive functions following cerebral infarction: Secondary | ICD-10-CM | POA: Diagnosis not present

## 2017-09-28 DIAGNOSIS — I6529 Occlusion and stenosis of unspecified carotid artery: Secondary | ICD-10-CM | POA: Diagnosis not present

## 2017-09-29 DIAGNOSIS — I6529 Occlusion and stenosis of unspecified carotid artery: Secondary | ICD-10-CM | POA: Diagnosis not present

## 2017-09-29 DIAGNOSIS — R634 Abnormal weight loss: Secondary | ICD-10-CM | POA: Diagnosis not present

## 2017-09-29 DIAGNOSIS — I69318 Other symptoms and signs involving cognitive functions following cerebral infarction: Secondary | ICD-10-CM | POA: Diagnosis not present

## 2017-09-29 DIAGNOSIS — I10 Essential (primary) hypertension: Secondary | ICD-10-CM | POA: Diagnosis not present

## 2017-09-29 DIAGNOSIS — I739 Peripheral vascular disease, unspecified: Secondary | ICD-10-CM | POA: Diagnosis not present

## 2017-09-29 DIAGNOSIS — F0150 Vascular dementia without behavioral disturbance: Secondary | ICD-10-CM | POA: Diagnosis not present

## 2017-09-30 DIAGNOSIS — R634 Abnormal weight loss: Secondary | ICD-10-CM | POA: Diagnosis not present

## 2017-09-30 DIAGNOSIS — F0391 Unspecified dementia with behavioral disturbance: Secondary | ICD-10-CM | POA: Diagnosis not present

## 2017-09-30 DIAGNOSIS — I6529 Occlusion and stenosis of unspecified carotid artery: Secondary | ICD-10-CM | POA: Diagnosis not present

## 2017-09-30 DIAGNOSIS — G894 Chronic pain syndrome: Secondary | ICD-10-CM | POA: Diagnosis not present

## 2017-09-30 DIAGNOSIS — I739 Peripheral vascular disease, unspecified: Secondary | ICD-10-CM | POA: Diagnosis not present

## 2017-09-30 DIAGNOSIS — I10 Essential (primary) hypertension: Secondary | ICD-10-CM | POA: Diagnosis not present

## 2017-09-30 DIAGNOSIS — F0150 Vascular dementia without behavioral disturbance: Secondary | ICD-10-CM | POA: Diagnosis not present

## 2017-09-30 DIAGNOSIS — S81811A Laceration without foreign body, right lower leg, initial encounter: Secondary | ICD-10-CM | POA: Diagnosis not present

## 2017-09-30 DIAGNOSIS — I69318 Other symptoms and signs involving cognitive functions following cerebral infarction: Secondary | ICD-10-CM | POA: Diagnosis not present

## 2017-10-03 DIAGNOSIS — F0150 Vascular dementia without behavioral disturbance: Secondary | ICD-10-CM | POA: Diagnosis not present

## 2017-10-03 DIAGNOSIS — I739 Peripheral vascular disease, unspecified: Secondary | ICD-10-CM | POA: Diagnosis not present

## 2017-10-03 DIAGNOSIS — R634 Abnormal weight loss: Secondary | ICD-10-CM | POA: Diagnosis not present

## 2017-10-03 DIAGNOSIS — I10 Essential (primary) hypertension: Secondary | ICD-10-CM | POA: Diagnosis not present

## 2017-10-03 DIAGNOSIS — I6529 Occlusion and stenosis of unspecified carotid artery: Secondary | ICD-10-CM | POA: Diagnosis not present

## 2017-10-03 DIAGNOSIS — I69318 Other symptoms and signs involving cognitive functions following cerebral infarction: Secondary | ICD-10-CM | POA: Diagnosis not present

## 2017-10-04 DIAGNOSIS — I6529 Occlusion and stenosis of unspecified carotid artery: Secondary | ICD-10-CM | POA: Diagnosis not present

## 2017-10-04 DIAGNOSIS — F0150 Vascular dementia without behavioral disturbance: Secondary | ICD-10-CM | POA: Diagnosis not present

## 2017-10-04 DIAGNOSIS — I739 Peripheral vascular disease, unspecified: Secondary | ICD-10-CM | POA: Diagnosis not present

## 2017-10-04 DIAGNOSIS — I69318 Other symptoms and signs involving cognitive functions following cerebral infarction: Secondary | ICD-10-CM | POA: Diagnosis not present

## 2017-10-04 DIAGNOSIS — R634 Abnormal weight loss: Secondary | ICD-10-CM | POA: Diagnosis not present

## 2017-10-04 DIAGNOSIS — I10 Essential (primary) hypertension: Secondary | ICD-10-CM | POA: Diagnosis not present

## 2017-10-05 DIAGNOSIS — I10 Essential (primary) hypertension: Secondary | ICD-10-CM | POA: Diagnosis not present

## 2017-10-05 DIAGNOSIS — R634 Abnormal weight loss: Secondary | ICD-10-CM | POA: Diagnosis not present

## 2017-10-05 DIAGNOSIS — F0150 Vascular dementia without behavioral disturbance: Secondary | ICD-10-CM | POA: Diagnosis not present

## 2017-10-05 DIAGNOSIS — I739 Peripheral vascular disease, unspecified: Secondary | ICD-10-CM | POA: Diagnosis not present

## 2017-10-05 DIAGNOSIS — I6529 Occlusion and stenosis of unspecified carotid artery: Secondary | ICD-10-CM | POA: Diagnosis not present

## 2017-10-05 DIAGNOSIS — I69318 Other symptoms and signs involving cognitive functions following cerebral infarction: Secondary | ICD-10-CM | POA: Diagnosis not present

## 2017-10-06 DIAGNOSIS — R634 Abnormal weight loss: Secondary | ICD-10-CM | POA: Diagnosis not present

## 2017-10-06 DIAGNOSIS — I10 Essential (primary) hypertension: Secondary | ICD-10-CM | POA: Diagnosis not present

## 2017-10-06 DIAGNOSIS — F0150 Vascular dementia without behavioral disturbance: Secondary | ICD-10-CM | POA: Diagnosis not present

## 2017-10-06 DIAGNOSIS — I739 Peripheral vascular disease, unspecified: Secondary | ICD-10-CM | POA: Diagnosis not present

## 2017-10-06 DIAGNOSIS — I69318 Other symptoms and signs involving cognitive functions following cerebral infarction: Secondary | ICD-10-CM | POA: Diagnosis not present

## 2017-10-06 DIAGNOSIS — I6529 Occlusion and stenosis of unspecified carotid artery: Secondary | ICD-10-CM | POA: Diagnosis not present

## 2017-10-07 DIAGNOSIS — I739 Peripheral vascular disease, unspecified: Secondary | ICD-10-CM | POA: Diagnosis not present

## 2017-10-07 DIAGNOSIS — I69318 Other symptoms and signs involving cognitive functions following cerebral infarction: Secondary | ICD-10-CM | POA: Diagnosis not present

## 2017-10-07 DIAGNOSIS — I10 Essential (primary) hypertension: Secondary | ICD-10-CM | POA: Diagnosis not present

## 2017-10-07 DIAGNOSIS — I6529 Occlusion and stenosis of unspecified carotid artery: Secondary | ICD-10-CM | POA: Diagnosis not present

## 2017-10-07 DIAGNOSIS — R634 Abnormal weight loss: Secondary | ICD-10-CM | POA: Diagnosis not present

## 2017-10-07 DIAGNOSIS — F0150 Vascular dementia without behavioral disturbance: Secondary | ICD-10-CM | POA: Diagnosis not present

## 2017-10-10 DIAGNOSIS — R634 Abnormal weight loss: Secondary | ICD-10-CM | POA: Diagnosis not present

## 2017-10-10 DIAGNOSIS — I10 Essential (primary) hypertension: Secondary | ICD-10-CM | POA: Diagnosis not present

## 2017-10-10 DIAGNOSIS — I69318 Other symptoms and signs involving cognitive functions following cerebral infarction: Secondary | ICD-10-CM | POA: Diagnosis not present

## 2017-10-10 DIAGNOSIS — I6529 Occlusion and stenosis of unspecified carotid artery: Secondary | ICD-10-CM | POA: Diagnosis not present

## 2017-10-10 DIAGNOSIS — F0150 Vascular dementia without behavioral disturbance: Secondary | ICD-10-CM | POA: Diagnosis not present

## 2017-10-10 DIAGNOSIS — I739 Peripheral vascular disease, unspecified: Secondary | ICD-10-CM | POA: Diagnosis not present

## 2017-10-11 DIAGNOSIS — I6529 Occlusion and stenosis of unspecified carotid artery: Secondary | ICD-10-CM | POA: Diagnosis not present

## 2017-10-11 DIAGNOSIS — R634 Abnormal weight loss: Secondary | ICD-10-CM | POA: Diagnosis not present

## 2017-10-11 DIAGNOSIS — I10 Essential (primary) hypertension: Secondary | ICD-10-CM | POA: Diagnosis not present

## 2017-10-11 DIAGNOSIS — I69318 Other symptoms and signs involving cognitive functions following cerebral infarction: Secondary | ICD-10-CM | POA: Diagnosis not present

## 2017-10-11 DIAGNOSIS — F0150 Vascular dementia without behavioral disturbance: Secondary | ICD-10-CM | POA: Diagnosis not present

## 2017-10-11 DIAGNOSIS — I739 Peripheral vascular disease, unspecified: Secondary | ICD-10-CM | POA: Diagnosis not present

## 2017-10-12 DIAGNOSIS — F0150 Vascular dementia without behavioral disturbance: Secondary | ICD-10-CM | POA: Diagnosis not present

## 2017-10-12 DIAGNOSIS — I10 Essential (primary) hypertension: Secondary | ICD-10-CM | POA: Diagnosis not present

## 2017-10-12 DIAGNOSIS — I69318 Other symptoms and signs involving cognitive functions following cerebral infarction: Secondary | ICD-10-CM | POA: Diagnosis not present

## 2017-10-12 DIAGNOSIS — I739 Peripheral vascular disease, unspecified: Secondary | ICD-10-CM | POA: Diagnosis not present

## 2017-10-12 DIAGNOSIS — R634 Abnormal weight loss: Secondary | ICD-10-CM | POA: Diagnosis not present

## 2017-10-12 DIAGNOSIS — I6529 Occlusion and stenosis of unspecified carotid artery: Secondary | ICD-10-CM | POA: Diagnosis not present

## 2017-10-13 DIAGNOSIS — I10 Essential (primary) hypertension: Secondary | ICD-10-CM | POA: Diagnosis not present

## 2017-10-13 DIAGNOSIS — I6529 Occlusion and stenosis of unspecified carotid artery: Secondary | ICD-10-CM | POA: Diagnosis not present

## 2017-10-13 DIAGNOSIS — F0150 Vascular dementia without behavioral disturbance: Secondary | ICD-10-CM | POA: Diagnosis not present

## 2017-10-13 DIAGNOSIS — R634 Abnormal weight loss: Secondary | ICD-10-CM | POA: Diagnosis not present

## 2017-10-13 DIAGNOSIS — I69318 Other symptoms and signs involving cognitive functions following cerebral infarction: Secondary | ICD-10-CM | POA: Diagnosis not present

## 2017-10-13 DIAGNOSIS — I739 Peripheral vascular disease, unspecified: Secondary | ICD-10-CM | POA: Diagnosis not present

## 2017-10-14 DIAGNOSIS — R634 Abnormal weight loss: Secondary | ICD-10-CM | POA: Diagnosis not present

## 2017-10-14 DIAGNOSIS — I10 Essential (primary) hypertension: Secondary | ICD-10-CM | POA: Diagnosis not present

## 2017-10-14 DIAGNOSIS — I6529 Occlusion and stenosis of unspecified carotid artery: Secondary | ICD-10-CM | POA: Diagnosis not present

## 2017-10-14 DIAGNOSIS — F0150 Vascular dementia without behavioral disturbance: Secondary | ICD-10-CM | POA: Diagnosis not present

## 2017-10-14 DIAGNOSIS — I739 Peripheral vascular disease, unspecified: Secondary | ICD-10-CM | POA: Diagnosis not present

## 2017-10-14 DIAGNOSIS — I69318 Other symptoms and signs involving cognitive functions following cerebral infarction: Secondary | ICD-10-CM | POA: Diagnosis not present

## 2017-10-17 DIAGNOSIS — I10 Essential (primary) hypertension: Secondary | ICD-10-CM | POA: Diagnosis not present

## 2017-10-17 DIAGNOSIS — F0150 Vascular dementia without behavioral disturbance: Secondary | ICD-10-CM | POA: Diagnosis not present

## 2017-10-17 DIAGNOSIS — R634 Abnormal weight loss: Secondary | ICD-10-CM | POA: Diagnosis not present

## 2017-10-17 DIAGNOSIS — I6529 Occlusion and stenosis of unspecified carotid artery: Secondary | ICD-10-CM | POA: Diagnosis not present

## 2017-10-17 DIAGNOSIS — I739 Peripheral vascular disease, unspecified: Secondary | ICD-10-CM | POA: Diagnosis not present

## 2017-10-17 DIAGNOSIS — I69318 Other symptoms and signs involving cognitive functions following cerebral infarction: Secondary | ICD-10-CM | POA: Diagnosis not present

## 2017-10-18 DIAGNOSIS — I739 Peripheral vascular disease, unspecified: Secondary | ICD-10-CM | POA: Diagnosis not present

## 2017-10-18 DIAGNOSIS — R634 Abnormal weight loss: Secondary | ICD-10-CM | POA: Diagnosis not present

## 2017-10-18 DIAGNOSIS — I6529 Occlusion and stenosis of unspecified carotid artery: Secondary | ICD-10-CM | POA: Diagnosis not present

## 2017-10-18 DIAGNOSIS — I69318 Other symptoms and signs involving cognitive functions following cerebral infarction: Secondary | ICD-10-CM | POA: Diagnosis not present

## 2017-10-18 DIAGNOSIS — I10 Essential (primary) hypertension: Secondary | ICD-10-CM | POA: Diagnosis not present

## 2017-10-18 DIAGNOSIS — F0150 Vascular dementia without behavioral disturbance: Secondary | ICD-10-CM | POA: Diagnosis not present

## 2017-10-19 DIAGNOSIS — I10 Essential (primary) hypertension: Secondary | ICD-10-CM | POA: Diagnosis not present

## 2017-10-19 DIAGNOSIS — I6529 Occlusion and stenosis of unspecified carotid artery: Secondary | ICD-10-CM | POA: Diagnosis not present

## 2017-10-19 DIAGNOSIS — I739 Peripheral vascular disease, unspecified: Secondary | ICD-10-CM | POA: Diagnosis not present

## 2017-10-19 DIAGNOSIS — I69318 Other symptoms and signs involving cognitive functions following cerebral infarction: Secondary | ICD-10-CM | POA: Diagnosis not present

## 2017-10-19 DIAGNOSIS — R634 Abnormal weight loss: Secondary | ICD-10-CM | POA: Diagnosis not present

## 2017-10-19 DIAGNOSIS — F0150 Vascular dementia without behavioral disturbance: Secondary | ICD-10-CM | POA: Diagnosis not present

## 2017-10-20 DIAGNOSIS — F0150 Vascular dementia without behavioral disturbance: Secondary | ICD-10-CM | POA: Diagnosis not present

## 2017-10-20 DIAGNOSIS — I69318 Other symptoms and signs involving cognitive functions following cerebral infarction: Secondary | ICD-10-CM | POA: Diagnosis not present

## 2017-10-20 DIAGNOSIS — R634 Abnormal weight loss: Secondary | ICD-10-CM | POA: Diagnosis not present

## 2017-10-20 DIAGNOSIS — I10 Essential (primary) hypertension: Secondary | ICD-10-CM | POA: Diagnosis not present

## 2017-10-20 DIAGNOSIS — I739 Peripheral vascular disease, unspecified: Secondary | ICD-10-CM | POA: Diagnosis not present

## 2017-10-20 DIAGNOSIS — I6529 Occlusion and stenosis of unspecified carotid artery: Secondary | ICD-10-CM | POA: Diagnosis not present

## 2017-10-21 ENCOUNTER — Encounter (INDEPENDENT_AMBULATORY_CARE_PROVIDER_SITE_OTHER): Payer: Medicare Other

## 2017-10-21 ENCOUNTER — Ambulatory Visit (INDEPENDENT_AMBULATORY_CARE_PROVIDER_SITE_OTHER): Payer: Medicare Other | Admitting: Vascular Surgery

## 2017-10-21 DIAGNOSIS — I6529 Occlusion and stenosis of unspecified carotid artery: Secondary | ICD-10-CM | POA: Diagnosis not present

## 2017-10-21 DIAGNOSIS — I69318 Other symptoms and signs involving cognitive functions following cerebral infarction: Secondary | ICD-10-CM | POA: Diagnosis not present

## 2017-10-21 DIAGNOSIS — R634 Abnormal weight loss: Secondary | ICD-10-CM | POA: Diagnosis not present

## 2017-10-21 DIAGNOSIS — I10 Essential (primary) hypertension: Secondary | ICD-10-CM | POA: Diagnosis not present

## 2017-10-21 DIAGNOSIS — F0150 Vascular dementia without behavioral disturbance: Secondary | ICD-10-CM | POA: Diagnosis not present

## 2017-10-21 DIAGNOSIS — I739 Peripheral vascular disease, unspecified: Secondary | ICD-10-CM | POA: Diagnosis not present

## 2017-10-24 DIAGNOSIS — I739 Peripheral vascular disease, unspecified: Secondary | ICD-10-CM | POA: Diagnosis not present

## 2017-10-24 DIAGNOSIS — I10 Essential (primary) hypertension: Secondary | ICD-10-CM | POA: Diagnosis not present

## 2017-10-24 DIAGNOSIS — R634 Abnormal weight loss: Secondary | ICD-10-CM | POA: Diagnosis not present

## 2017-10-24 DIAGNOSIS — F0150 Vascular dementia without behavioral disturbance: Secondary | ICD-10-CM | POA: Diagnosis not present

## 2017-10-24 DIAGNOSIS — I69318 Other symptoms and signs involving cognitive functions following cerebral infarction: Secondary | ICD-10-CM | POA: Diagnosis not present

## 2017-10-24 DIAGNOSIS — I6529 Occlusion and stenosis of unspecified carotid artery: Secondary | ICD-10-CM | POA: Diagnosis not present

## 2017-10-25 DIAGNOSIS — I739 Peripheral vascular disease, unspecified: Secondary | ICD-10-CM | POA: Diagnosis not present

## 2017-10-25 DIAGNOSIS — I6529 Occlusion and stenosis of unspecified carotid artery: Secondary | ICD-10-CM | POA: Diagnosis not present

## 2017-10-25 DIAGNOSIS — R634 Abnormal weight loss: Secondary | ICD-10-CM | POA: Diagnosis not present

## 2017-10-25 DIAGNOSIS — I10 Essential (primary) hypertension: Secondary | ICD-10-CM | POA: Diagnosis not present

## 2017-10-25 DIAGNOSIS — I69318 Other symptoms and signs involving cognitive functions following cerebral infarction: Secondary | ICD-10-CM | POA: Diagnosis not present

## 2017-10-25 DIAGNOSIS — F0150 Vascular dementia without behavioral disturbance: Secondary | ICD-10-CM | POA: Diagnosis not present

## 2017-10-26 DIAGNOSIS — F0150 Vascular dementia without behavioral disturbance: Secondary | ICD-10-CM | POA: Diagnosis not present

## 2017-10-26 DIAGNOSIS — R634 Abnormal weight loss: Secondary | ICD-10-CM | POA: Diagnosis not present

## 2017-10-26 DIAGNOSIS — I10 Essential (primary) hypertension: Secondary | ICD-10-CM | POA: Diagnosis not present

## 2017-10-26 DIAGNOSIS — I69318 Other symptoms and signs involving cognitive functions following cerebral infarction: Secondary | ICD-10-CM | POA: Diagnosis not present

## 2017-10-26 DIAGNOSIS — I739 Peripheral vascular disease, unspecified: Secondary | ICD-10-CM | POA: Diagnosis not present

## 2017-10-26 DIAGNOSIS — I6529 Occlusion and stenosis of unspecified carotid artery: Secondary | ICD-10-CM | POA: Diagnosis not present

## 2017-10-27 DIAGNOSIS — Z681 Body mass index (BMI) 19 or less, adult: Secondary | ICD-10-CM | POA: Diagnosis not present

## 2017-10-27 DIAGNOSIS — K5909 Other constipation: Secondary | ICD-10-CM | POA: Diagnosis not present

## 2017-10-27 DIAGNOSIS — R634 Abnormal weight loss: Secondary | ICD-10-CM | POA: Diagnosis not present

## 2017-10-27 DIAGNOSIS — R64 Cachexia: Secondary | ICD-10-CM | POA: Diagnosis not present

## 2017-10-27 DIAGNOSIS — R55 Syncope and collapse: Secondary | ICD-10-CM | POA: Diagnosis not present

## 2017-10-27 DIAGNOSIS — M1991 Primary osteoarthritis, unspecified site: Secondary | ICD-10-CM | POA: Diagnosis not present

## 2017-10-27 DIAGNOSIS — Z87820 Personal history of traumatic brain injury: Secondary | ICD-10-CM | POA: Diagnosis not present

## 2017-10-27 DIAGNOSIS — I6529 Occlusion and stenosis of unspecified carotid artery: Secondary | ICD-10-CM | POA: Diagnosis not present

## 2017-10-27 DIAGNOSIS — R627 Adult failure to thrive: Secondary | ICD-10-CM | POA: Diagnosis not present

## 2017-10-27 DIAGNOSIS — E785 Hyperlipidemia, unspecified: Secondary | ICD-10-CM | POA: Diagnosis not present

## 2017-10-27 DIAGNOSIS — I69318 Other symptoms and signs involving cognitive functions following cerebral infarction: Secondary | ICD-10-CM | POA: Diagnosis not present

## 2017-10-27 DIAGNOSIS — I739 Peripheral vascular disease, unspecified: Secondary | ICD-10-CM | POA: Diagnosis not present

## 2017-10-27 DIAGNOSIS — I10 Essential (primary) hypertension: Secondary | ICD-10-CM | POA: Diagnosis not present

## 2017-10-27 DIAGNOSIS — F0150 Vascular dementia without behavioral disturbance: Secondary | ICD-10-CM | POA: Diagnosis not present

## 2017-10-27 DIAGNOSIS — Z9181 History of falling: Secondary | ICD-10-CM | POA: Diagnosis not present

## 2017-10-28 DIAGNOSIS — I69318 Other symptoms and signs involving cognitive functions following cerebral infarction: Secondary | ICD-10-CM | POA: Diagnosis not present

## 2017-10-28 DIAGNOSIS — R634 Abnormal weight loss: Secondary | ICD-10-CM | POA: Diagnosis not present

## 2017-10-28 DIAGNOSIS — I739 Peripheral vascular disease, unspecified: Secondary | ICD-10-CM | POA: Diagnosis not present

## 2017-10-28 DIAGNOSIS — F0150 Vascular dementia without behavioral disturbance: Secondary | ICD-10-CM | POA: Diagnosis not present

## 2017-10-28 DIAGNOSIS — I10 Essential (primary) hypertension: Secondary | ICD-10-CM | POA: Diagnosis not present

## 2017-10-28 DIAGNOSIS — I6529 Occlusion and stenosis of unspecified carotid artery: Secondary | ICD-10-CM | POA: Diagnosis not present

## 2017-10-31 DIAGNOSIS — R54 Age-related physical debility: Secondary | ICD-10-CM | POA: Diagnosis not present

## 2017-10-31 DIAGNOSIS — R634 Abnormal weight loss: Secondary | ICD-10-CM | POA: Diagnosis not present

## 2017-10-31 DIAGNOSIS — E440 Moderate protein-calorie malnutrition: Secondary | ICD-10-CM | POA: Diagnosis not present

## 2017-10-31 DIAGNOSIS — I6529 Occlusion and stenosis of unspecified carotid artery: Secondary | ICD-10-CM | POA: Diagnosis not present

## 2017-10-31 DIAGNOSIS — I739 Peripheral vascular disease, unspecified: Secondary | ICD-10-CM | POA: Diagnosis not present

## 2017-10-31 DIAGNOSIS — I10 Essential (primary) hypertension: Secondary | ICD-10-CM | POA: Diagnosis not present

## 2017-10-31 DIAGNOSIS — F0150 Vascular dementia without behavioral disturbance: Secondary | ICD-10-CM | POA: Diagnosis not present

## 2017-10-31 DIAGNOSIS — I69318 Other symptoms and signs involving cognitive functions following cerebral infarction: Secondary | ICD-10-CM | POA: Diagnosis not present

## 2017-10-31 DIAGNOSIS — F0390 Unspecified dementia without behavioral disturbance: Secondary | ICD-10-CM | POA: Diagnosis not present

## 2017-11-01 DIAGNOSIS — K5909 Other constipation: Secondary | ICD-10-CM | POA: Diagnosis not present

## 2017-11-01 DIAGNOSIS — I739 Peripheral vascular disease, unspecified: Secondary | ICD-10-CM | POA: Diagnosis not present

## 2017-11-01 DIAGNOSIS — I6529 Occlusion and stenosis of unspecified carotid artery: Secondary | ICD-10-CM | POA: Diagnosis not present

## 2017-11-01 DIAGNOSIS — R634 Abnormal weight loss: Secondary | ICD-10-CM | POA: Diagnosis not present

## 2017-11-01 DIAGNOSIS — F0150 Vascular dementia without behavioral disturbance: Secondary | ICD-10-CM | POA: Diagnosis not present

## 2017-11-01 DIAGNOSIS — R55 Syncope and collapse: Secondary | ICD-10-CM | POA: Diagnosis not present

## 2017-11-01 DIAGNOSIS — Z681 Body mass index (BMI) 19 or less, adult: Secondary | ICD-10-CM | POA: Diagnosis not present

## 2017-11-01 DIAGNOSIS — I69318 Other symptoms and signs involving cognitive functions following cerebral infarction: Secondary | ICD-10-CM | POA: Diagnosis not present

## 2017-11-01 DIAGNOSIS — I10 Essential (primary) hypertension: Secondary | ICD-10-CM | POA: Diagnosis not present

## 2017-11-01 DIAGNOSIS — R627 Adult failure to thrive: Secondary | ICD-10-CM | POA: Diagnosis not present

## 2017-11-02 DIAGNOSIS — I10 Essential (primary) hypertension: Secondary | ICD-10-CM | POA: Diagnosis not present

## 2017-11-02 DIAGNOSIS — F0150 Vascular dementia without behavioral disturbance: Secondary | ICD-10-CM | POA: Diagnosis not present

## 2017-11-02 DIAGNOSIS — I739 Peripheral vascular disease, unspecified: Secondary | ICD-10-CM | POA: Diagnosis not present

## 2017-11-02 DIAGNOSIS — I6529 Occlusion and stenosis of unspecified carotid artery: Secondary | ICD-10-CM | POA: Diagnosis not present

## 2017-11-02 DIAGNOSIS — I69318 Other symptoms and signs involving cognitive functions following cerebral infarction: Secondary | ICD-10-CM | POA: Diagnosis not present

## 2017-11-02 DIAGNOSIS — R634 Abnormal weight loss: Secondary | ICD-10-CM | POA: Diagnosis not present

## 2017-11-03 DIAGNOSIS — I69318 Other symptoms and signs involving cognitive functions following cerebral infarction: Secondary | ICD-10-CM | POA: Diagnosis not present

## 2017-11-03 DIAGNOSIS — F0150 Vascular dementia without behavioral disturbance: Secondary | ICD-10-CM | POA: Diagnosis not present

## 2017-11-03 DIAGNOSIS — I10 Essential (primary) hypertension: Secondary | ICD-10-CM | POA: Diagnosis not present

## 2017-11-03 DIAGNOSIS — I6529 Occlusion and stenosis of unspecified carotid artery: Secondary | ICD-10-CM | POA: Diagnosis not present

## 2017-11-03 DIAGNOSIS — R634 Abnormal weight loss: Secondary | ICD-10-CM | POA: Diagnosis not present

## 2017-11-03 DIAGNOSIS — I739 Peripheral vascular disease, unspecified: Secondary | ICD-10-CM | POA: Diagnosis not present

## 2017-11-04 DIAGNOSIS — I6529 Occlusion and stenosis of unspecified carotid artery: Secondary | ICD-10-CM | POA: Diagnosis not present

## 2017-11-04 DIAGNOSIS — R634 Abnormal weight loss: Secondary | ICD-10-CM | POA: Diagnosis not present

## 2017-11-04 DIAGNOSIS — F0150 Vascular dementia without behavioral disturbance: Secondary | ICD-10-CM | POA: Diagnosis not present

## 2017-11-04 DIAGNOSIS — I739 Peripheral vascular disease, unspecified: Secondary | ICD-10-CM | POA: Diagnosis not present

## 2017-11-04 DIAGNOSIS — I69318 Other symptoms and signs involving cognitive functions following cerebral infarction: Secondary | ICD-10-CM | POA: Diagnosis not present

## 2017-11-04 DIAGNOSIS — I10 Essential (primary) hypertension: Secondary | ICD-10-CM | POA: Diagnosis not present

## 2017-11-06 DIAGNOSIS — I69318 Other symptoms and signs involving cognitive functions following cerebral infarction: Secondary | ICD-10-CM | POA: Diagnosis not present

## 2017-11-06 DIAGNOSIS — I10 Essential (primary) hypertension: Secondary | ICD-10-CM | POA: Diagnosis not present

## 2017-11-06 DIAGNOSIS — F0150 Vascular dementia without behavioral disturbance: Secondary | ICD-10-CM | POA: Diagnosis not present

## 2017-11-06 DIAGNOSIS — I739 Peripheral vascular disease, unspecified: Secondary | ICD-10-CM | POA: Diagnosis not present

## 2017-11-06 DIAGNOSIS — R634 Abnormal weight loss: Secondary | ICD-10-CM | POA: Diagnosis not present

## 2017-11-06 DIAGNOSIS — I6529 Occlusion and stenosis of unspecified carotid artery: Secondary | ICD-10-CM | POA: Diagnosis not present

## 2017-11-07 DIAGNOSIS — I69318 Other symptoms and signs involving cognitive functions following cerebral infarction: Secondary | ICD-10-CM | POA: Diagnosis not present

## 2017-11-07 DIAGNOSIS — I6529 Occlusion and stenosis of unspecified carotid artery: Secondary | ICD-10-CM | POA: Diagnosis not present

## 2017-11-07 DIAGNOSIS — I739 Peripheral vascular disease, unspecified: Secondary | ICD-10-CM | POA: Diagnosis not present

## 2017-11-07 DIAGNOSIS — I10 Essential (primary) hypertension: Secondary | ICD-10-CM | POA: Diagnosis not present

## 2017-11-07 DIAGNOSIS — F0150 Vascular dementia without behavioral disturbance: Secondary | ICD-10-CM | POA: Diagnosis not present

## 2017-11-07 DIAGNOSIS — R634 Abnormal weight loss: Secondary | ICD-10-CM | POA: Diagnosis not present

## 2017-11-08 DIAGNOSIS — I10 Essential (primary) hypertension: Secondary | ICD-10-CM | POA: Diagnosis not present

## 2017-11-08 DIAGNOSIS — I69318 Other symptoms and signs involving cognitive functions following cerebral infarction: Secondary | ICD-10-CM | POA: Diagnosis not present

## 2017-11-08 DIAGNOSIS — I6529 Occlusion and stenosis of unspecified carotid artery: Secondary | ICD-10-CM | POA: Diagnosis not present

## 2017-11-08 DIAGNOSIS — R634 Abnormal weight loss: Secondary | ICD-10-CM | POA: Diagnosis not present

## 2017-11-08 DIAGNOSIS — I739 Peripheral vascular disease, unspecified: Secondary | ICD-10-CM | POA: Diagnosis not present

## 2017-11-08 DIAGNOSIS — F0150 Vascular dementia without behavioral disturbance: Secondary | ICD-10-CM | POA: Diagnosis not present

## 2017-11-09 DIAGNOSIS — I10 Essential (primary) hypertension: Secondary | ICD-10-CM | POA: Diagnosis not present

## 2017-11-09 DIAGNOSIS — I6529 Occlusion and stenosis of unspecified carotid artery: Secondary | ICD-10-CM | POA: Diagnosis not present

## 2017-11-09 DIAGNOSIS — R634 Abnormal weight loss: Secondary | ICD-10-CM | POA: Diagnosis not present

## 2017-11-09 DIAGNOSIS — I69318 Other symptoms and signs involving cognitive functions following cerebral infarction: Secondary | ICD-10-CM | POA: Diagnosis not present

## 2017-11-09 DIAGNOSIS — F0150 Vascular dementia without behavioral disturbance: Secondary | ICD-10-CM | POA: Diagnosis not present

## 2017-11-09 DIAGNOSIS — I739 Peripheral vascular disease, unspecified: Secondary | ICD-10-CM | POA: Diagnosis not present

## 2017-11-10 DIAGNOSIS — I69318 Other symptoms and signs involving cognitive functions following cerebral infarction: Secondary | ICD-10-CM | POA: Diagnosis not present

## 2017-11-10 DIAGNOSIS — F0150 Vascular dementia without behavioral disturbance: Secondary | ICD-10-CM | POA: Diagnosis not present

## 2017-11-10 DIAGNOSIS — I6529 Occlusion and stenosis of unspecified carotid artery: Secondary | ICD-10-CM | POA: Diagnosis not present

## 2017-11-10 DIAGNOSIS — R634 Abnormal weight loss: Secondary | ICD-10-CM | POA: Diagnosis not present

## 2017-11-10 DIAGNOSIS — I739 Peripheral vascular disease, unspecified: Secondary | ICD-10-CM | POA: Diagnosis not present

## 2017-11-10 DIAGNOSIS — I10 Essential (primary) hypertension: Secondary | ICD-10-CM | POA: Diagnosis not present

## 2017-11-11 DIAGNOSIS — I10 Essential (primary) hypertension: Secondary | ICD-10-CM | POA: Diagnosis not present

## 2017-11-11 DIAGNOSIS — I6529 Occlusion and stenosis of unspecified carotid artery: Secondary | ICD-10-CM | POA: Diagnosis not present

## 2017-11-11 DIAGNOSIS — F0150 Vascular dementia without behavioral disturbance: Secondary | ICD-10-CM | POA: Diagnosis not present

## 2017-11-11 DIAGNOSIS — R634 Abnormal weight loss: Secondary | ICD-10-CM | POA: Diagnosis not present

## 2017-11-11 DIAGNOSIS — I69318 Other symptoms and signs involving cognitive functions following cerebral infarction: Secondary | ICD-10-CM | POA: Diagnosis not present

## 2017-11-11 DIAGNOSIS — I739 Peripheral vascular disease, unspecified: Secondary | ICD-10-CM | POA: Diagnosis not present

## 2017-11-14 DIAGNOSIS — F0150 Vascular dementia without behavioral disturbance: Secondary | ICD-10-CM | POA: Diagnosis not present

## 2017-11-14 DIAGNOSIS — I6529 Occlusion and stenosis of unspecified carotid artery: Secondary | ICD-10-CM | POA: Diagnosis not present

## 2017-11-14 DIAGNOSIS — R634 Abnormal weight loss: Secondary | ICD-10-CM | POA: Diagnosis not present

## 2017-11-14 DIAGNOSIS — I739 Peripheral vascular disease, unspecified: Secondary | ICD-10-CM | POA: Diagnosis not present

## 2017-11-14 DIAGNOSIS — I10 Essential (primary) hypertension: Secondary | ICD-10-CM | POA: Diagnosis not present

## 2017-11-14 DIAGNOSIS — I69318 Other symptoms and signs involving cognitive functions following cerebral infarction: Secondary | ICD-10-CM | POA: Diagnosis not present

## 2017-11-15 DIAGNOSIS — R634 Abnormal weight loss: Secondary | ICD-10-CM | POA: Diagnosis not present

## 2017-11-15 DIAGNOSIS — I10 Essential (primary) hypertension: Secondary | ICD-10-CM | POA: Diagnosis not present

## 2017-11-15 DIAGNOSIS — I6529 Occlusion and stenosis of unspecified carotid artery: Secondary | ICD-10-CM | POA: Diagnosis not present

## 2017-11-15 DIAGNOSIS — I739 Peripheral vascular disease, unspecified: Secondary | ICD-10-CM | POA: Diagnosis not present

## 2017-11-15 DIAGNOSIS — F0150 Vascular dementia without behavioral disturbance: Secondary | ICD-10-CM | POA: Diagnosis not present

## 2017-11-15 DIAGNOSIS — I69318 Other symptoms and signs involving cognitive functions following cerebral infarction: Secondary | ICD-10-CM | POA: Diagnosis not present

## 2017-11-16 DIAGNOSIS — I6529 Occlusion and stenosis of unspecified carotid artery: Secondary | ICD-10-CM | POA: Diagnosis not present

## 2017-11-16 DIAGNOSIS — I69318 Other symptoms and signs involving cognitive functions following cerebral infarction: Secondary | ICD-10-CM | POA: Diagnosis not present

## 2017-11-16 DIAGNOSIS — F0150 Vascular dementia without behavioral disturbance: Secondary | ICD-10-CM | POA: Diagnosis not present

## 2017-11-16 DIAGNOSIS — I739 Peripheral vascular disease, unspecified: Secondary | ICD-10-CM | POA: Diagnosis not present

## 2017-11-16 DIAGNOSIS — I10 Essential (primary) hypertension: Secondary | ICD-10-CM | POA: Diagnosis not present

## 2017-11-16 DIAGNOSIS — R634 Abnormal weight loss: Secondary | ICD-10-CM | POA: Diagnosis not present

## 2017-11-17 DIAGNOSIS — I739 Peripheral vascular disease, unspecified: Secondary | ICD-10-CM | POA: Diagnosis not present

## 2017-11-17 DIAGNOSIS — F0150 Vascular dementia without behavioral disturbance: Secondary | ICD-10-CM | POA: Diagnosis not present

## 2017-11-17 DIAGNOSIS — R634 Abnormal weight loss: Secondary | ICD-10-CM | POA: Diagnosis not present

## 2017-11-17 DIAGNOSIS — I6529 Occlusion and stenosis of unspecified carotid artery: Secondary | ICD-10-CM | POA: Diagnosis not present

## 2017-11-17 DIAGNOSIS — I69318 Other symptoms and signs involving cognitive functions following cerebral infarction: Secondary | ICD-10-CM | POA: Diagnosis not present

## 2017-11-17 DIAGNOSIS — I10 Essential (primary) hypertension: Secondary | ICD-10-CM | POA: Diagnosis not present

## 2017-11-18 DIAGNOSIS — R634 Abnormal weight loss: Secondary | ICD-10-CM | POA: Diagnosis not present

## 2017-11-18 DIAGNOSIS — I69318 Other symptoms and signs involving cognitive functions following cerebral infarction: Secondary | ICD-10-CM | POA: Diagnosis not present

## 2017-11-18 DIAGNOSIS — I739 Peripheral vascular disease, unspecified: Secondary | ICD-10-CM | POA: Diagnosis not present

## 2017-11-18 DIAGNOSIS — I10 Essential (primary) hypertension: Secondary | ICD-10-CM | POA: Diagnosis not present

## 2017-11-18 DIAGNOSIS — F0150 Vascular dementia without behavioral disturbance: Secondary | ICD-10-CM | POA: Diagnosis not present

## 2017-11-18 DIAGNOSIS — I6529 Occlusion and stenosis of unspecified carotid artery: Secondary | ICD-10-CM | POA: Diagnosis not present

## 2017-11-21 DIAGNOSIS — R634 Abnormal weight loss: Secondary | ICD-10-CM | POA: Diagnosis not present

## 2017-11-21 DIAGNOSIS — I6529 Occlusion and stenosis of unspecified carotid artery: Secondary | ICD-10-CM | POA: Diagnosis not present

## 2017-11-21 DIAGNOSIS — I10 Essential (primary) hypertension: Secondary | ICD-10-CM | POA: Diagnosis not present

## 2017-11-21 DIAGNOSIS — I69318 Other symptoms and signs involving cognitive functions following cerebral infarction: Secondary | ICD-10-CM | POA: Diagnosis not present

## 2017-11-21 DIAGNOSIS — F0150 Vascular dementia without behavioral disturbance: Secondary | ICD-10-CM | POA: Diagnosis not present

## 2017-11-21 DIAGNOSIS — I739 Peripheral vascular disease, unspecified: Secondary | ICD-10-CM | POA: Diagnosis not present

## 2017-11-22 DIAGNOSIS — I69318 Other symptoms and signs involving cognitive functions following cerebral infarction: Secondary | ICD-10-CM | POA: Diagnosis not present

## 2017-11-22 DIAGNOSIS — I6529 Occlusion and stenosis of unspecified carotid artery: Secondary | ICD-10-CM | POA: Diagnosis not present

## 2017-11-22 DIAGNOSIS — I10 Essential (primary) hypertension: Secondary | ICD-10-CM | POA: Diagnosis not present

## 2017-11-22 DIAGNOSIS — R634 Abnormal weight loss: Secondary | ICD-10-CM | POA: Diagnosis not present

## 2017-11-22 DIAGNOSIS — I739 Peripheral vascular disease, unspecified: Secondary | ICD-10-CM | POA: Diagnosis not present

## 2017-11-22 DIAGNOSIS — F0150 Vascular dementia without behavioral disturbance: Secondary | ICD-10-CM | POA: Diagnosis not present

## 2017-11-23 DIAGNOSIS — I69318 Other symptoms and signs involving cognitive functions following cerebral infarction: Secondary | ICD-10-CM | POA: Diagnosis not present

## 2017-11-23 DIAGNOSIS — I10 Essential (primary) hypertension: Secondary | ICD-10-CM | POA: Diagnosis not present

## 2017-11-23 DIAGNOSIS — F0150 Vascular dementia without behavioral disturbance: Secondary | ICD-10-CM | POA: Diagnosis not present

## 2017-11-23 DIAGNOSIS — I739 Peripheral vascular disease, unspecified: Secondary | ICD-10-CM | POA: Diagnosis not present

## 2017-11-23 DIAGNOSIS — R634 Abnormal weight loss: Secondary | ICD-10-CM | POA: Diagnosis not present

## 2017-11-23 DIAGNOSIS — I6529 Occlusion and stenosis of unspecified carotid artery: Secondary | ICD-10-CM | POA: Diagnosis not present

## 2017-11-24 DIAGNOSIS — I6529 Occlusion and stenosis of unspecified carotid artery: Secondary | ICD-10-CM | POA: Diagnosis not present

## 2017-11-24 DIAGNOSIS — F0150 Vascular dementia without behavioral disturbance: Secondary | ICD-10-CM | POA: Diagnosis not present

## 2017-11-24 DIAGNOSIS — R634 Abnormal weight loss: Secondary | ICD-10-CM | POA: Diagnosis not present

## 2017-11-24 DIAGNOSIS — I69318 Other symptoms and signs involving cognitive functions following cerebral infarction: Secondary | ICD-10-CM | POA: Diagnosis not present

## 2017-11-24 DIAGNOSIS — I739 Peripheral vascular disease, unspecified: Secondary | ICD-10-CM | POA: Diagnosis not present

## 2017-11-24 DIAGNOSIS — I10 Essential (primary) hypertension: Secondary | ICD-10-CM | POA: Diagnosis not present

## 2017-11-25 DIAGNOSIS — I6529 Occlusion and stenosis of unspecified carotid artery: Secondary | ICD-10-CM | POA: Diagnosis not present

## 2017-11-25 DIAGNOSIS — I10 Essential (primary) hypertension: Secondary | ICD-10-CM | POA: Diagnosis not present

## 2017-11-25 DIAGNOSIS — F0150 Vascular dementia without behavioral disturbance: Secondary | ICD-10-CM | POA: Diagnosis not present

## 2017-11-25 DIAGNOSIS — I739 Peripheral vascular disease, unspecified: Secondary | ICD-10-CM | POA: Diagnosis not present

## 2017-11-25 DIAGNOSIS — R634 Abnormal weight loss: Secondary | ICD-10-CM | POA: Diagnosis not present

## 2017-11-25 DIAGNOSIS — I69318 Other symptoms and signs involving cognitive functions following cerebral infarction: Secondary | ICD-10-CM | POA: Diagnosis not present

## 2017-11-27 DIAGNOSIS — F0150 Vascular dementia without behavioral disturbance: Secondary | ICD-10-CM | POA: Diagnosis not present

## 2017-11-27 DIAGNOSIS — I739 Peripheral vascular disease, unspecified: Secondary | ICD-10-CM | POA: Diagnosis not present

## 2017-11-27 DIAGNOSIS — I69318 Other symptoms and signs involving cognitive functions following cerebral infarction: Secondary | ICD-10-CM | POA: Diagnosis not present

## 2017-11-27 DIAGNOSIS — M1991 Primary osteoarthritis, unspecified site: Secondary | ICD-10-CM | POA: Diagnosis not present

## 2017-11-27 DIAGNOSIS — R64 Cachexia: Secondary | ICD-10-CM | POA: Diagnosis not present

## 2017-11-27 DIAGNOSIS — E785 Hyperlipidemia, unspecified: Secondary | ICD-10-CM | POA: Diagnosis not present

## 2017-11-27 DIAGNOSIS — R634 Abnormal weight loss: Secondary | ICD-10-CM | POA: Diagnosis not present

## 2017-11-27 DIAGNOSIS — Z87820 Personal history of traumatic brain injury: Secondary | ICD-10-CM | POA: Diagnosis not present

## 2017-11-27 DIAGNOSIS — R627 Adult failure to thrive: Secondary | ICD-10-CM | POA: Diagnosis not present

## 2017-11-27 DIAGNOSIS — Z9181 History of falling: Secondary | ICD-10-CM | POA: Diagnosis not present

## 2017-11-27 DIAGNOSIS — K5909 Other constipation: Secondary | ICD-10-CM | POA: Diagnosis not present

## 2017-11-27 DIAGNOSIS — R55 Syncope and collapse: Secondary | ICD-10-CM | POA: Diagnosis not present

## 2017-11-27 DIAGNOSIS — I10 Essential (primary) hypertension: Secondary | ICD-10-CM | POA: Diagnosis not present

## 2017-11-27 DIAGNOSIS — I6529 Occlusion and stenosis of unspecified carotid artery: Secondary | ICD-10-CM | POA: Diagnosis not present

## 2017-11-27 DIAGNOSIS — Z681 Body mass index (BMI) 19 or less, adult: Secondary | ICD-10-CM | POA: Diagnosis not present

## 2017-11-28 DIAGNOSIS — R634 Abnormal weight loss: Secondary | ICD-10-CM | POA: Diagnosis not present

## 2017-11-28 DIAGNOSIS — I10 Essential (primary) hypertension: Secondary | ICD-10-CM | POA: Diagnosis not present

## 2017-11-28 DIAGNOSIS — I739 Peripheral vascular disease, unspecified: Secondary | ICD-10-CM | POA: Diagnosis not present

## 2017-11-28 DIAGNOSIS — I6529 Occlusion and stenosis of unspecified carotid artery: Secondary | ICD-10-CM | POA: Diagnosis not present

## 2017-11-28 DIAGNOSIS — F0150 Vascular dementia without behavioral disturbance: Secondary | ICD-10-CM | POA: Diagnosis not present

## 2017-11-28 DIAGNOSIS — I69318 Other symptoms and signs involving cognitive functions following cerebral infarction: Secondary | ICD-10-CM | POA: Diagnosis not present

## 2017-11-29 DIAGNOSIS — I69318 Other symptoms and signs involving cognitive functions following cerebral infarction: Secondary | ICD-10-CM | POA: Diagnosis not present

## 2017-11-29 DIAGNOSIS — I10 Essential (primary) hypertension: Secondary | ICD-10-CM | POA: Diagnosis not present

## 2017-11-29 DIAGNOSIS — I739 Peripheral vascular disease, unspecified: Secondary | ICD-10-CM | POA: Diagnosis not present

## 2017-11-29 DIAGNOSIS — I6529 Occlusion and stenosis of unspecified carotid artery: Secondary | ICD-10-CM | POA: Diagnosis not present

## 2017-11-29 DIAGNOSIS — R634 Abnormal weight loss: Secondary | ICD-10-CM | POA: Diagnosis not present

## 2017-11-29 DIAGNOSIS — F0150 Vascular dementia without behavioral disturbance: Secondary | ICD-10-CM | POA: Diagnosis not present

## 2017-11-30 DIAGNOSIS — I739 Peripheral vascular disease, unspecified: Secondary | ICD-10-CM | POA: Diagnosis not present

## 2017-11-30 DIAGNOSIS — F0150 Vascular dementia without behavioral disturbance: Secondary | ICD-10-CM | POA: Diagnosis not present

## 2017-11-30 DIAGNOSIS — I69318 Other symptoms and signs involving cognitive functions following cerebral infarction: Secondary | ICD-10-CM | POA: Diagnosis not present

## 2017-11-30 DIAGNOSIS — I6529 Occlusion and stenosis of unspecified carotid artery: Secondary | ICD-10-CM | POA: Diagnosis not present

## 2017-11-30 DIAGNOSIS — R634 Abnormal weight loss: Secondary | ICD-10-CM | POA: Diagnosis not present

## 2017-11-30 DIAGNOSIS — I10 Essential (primary) hypertension: Secondary | ICD-10-CM | POA: Diagnosis not present

## 2017-12-01 DIAGNOSIS — F0150 Vascular dementia without behavioral disturbance: Secondary | ICD-10-CM | POA: Diagnosis not present

## 2017-12-01 DIAGNOSIS — I739 Peripheral vascular disease, unspecified: Secondary | ICD-10-CM | POA: Diagnosis not present

## 2017-12-01 DIAGNOSIS — I69318 Other symptoms and signs involving cognitive functions following cerebral infarction: Secondary | ICD-10-CM | POA: Diagnosis not present

## 2017-12-01 DIAGNOSIS — I10 Essential (primary) hypertension: Secondary | ICD-10-CM | POA: Diagnosis not present

## 2017-12-01 DIAGNOSIS — R634 Abnormal weight loss: Secondary | ICD-10-CM | POA: Diagnosis not present

## 2017-12-01 DIAGNOSIS — I6529 Occlusion and stenosis of unspecified carotid artery: Secondary | ICD-10-CM | POA: Diagnosis not present

## 2017-12-02 DIAGNOSIS — F0150 Vascular dementia without behavioral disturbance: Secondary | ICD-10-CM | POA: Diagnosis not present

## 2017-12-02 DIAGNOSIS — I739 Peripheral vascular disease, unspecified: Secondary | ICD-10-CM | POA: Diagnosis not present

## 2017-12-02 DIAGNOSIS — R634 Abnormal weight loss: Secondary | ICD-10-CM | POA: Diagnosis not present

## 2017-12-02 DIAGNOSIS — I6529 Occlusion and stenosis of unspecified carotid artery: Secondary | ICD-10-CM | POA: Diagnosis not present

## 2017-12-02 DIAGNOSIS — I10 Essential (primary) hypertension: Secondary | ICD-10-CM | POA: Diagnosis not present

## 2017-12-02 DIAGNOSIS — I69318 Other symptoms and signs involving cognitive functions following cerebral infarction: Secondary | ICD-10-CM | POA: Diagnosis not present

## 2017-12-05 DIAGNOSIS — F0150 Vascular dementia without behavioral disturbance: Secondary | ICD-10-CM | POA: Diagnosis not present

## 2017-12-05 DIAGNOSIS — R634 Abnormal weight loss: Secondary | ICD-10-CM | POA: Diagnosis not present

## 2017-12-05 DIAGNOSIS — I10 Essential (primary) hypertension: Secondary | ICD-10-CM | POA: Diagnosis not present

## 2017-12-05 DIAGNOSIS — I69318 Other symptoms and signs involving cognitive functions following cerebral infarction: Secondary | ICD-10-CM | POA: Diagnosis not present

## 2017-12-05 DIAGNOSIS — I6529 Occlusion and stenosis of unspecified carotid artery: Secondary | ICD-10-CM | POA: Diagnosis not present

## 2017-12-05 DIAGNOSIS — I739 Peripheral vascular disease, unspecified: Secondary | ICD-10-CM | POA: Diagnosis not present

## 2017-12-07 DIAGNOSIS — I739 Peripheral vascular disease, unspecified: Secondary | ICD-10-CM | POA: Diagnosis not present

## 2017-12-07 DIAGNOSIS — I69318 Other symptoms and signs involving cognitive functions following cerebral infarction: Secondary | ICD-10-CM | POA: Diagnosis not present

## 2017-12-07 DIAGNOSIS — I10 Essential (primary) hypertension: Secondary | ICD-10-CM | POA: Diagnosis not present

## 2017-12-07 DIAGNOSIS — R634 Abnormal weight loss: Secondary | ICD-10-CM | POA: Diagnosis not present

## 2017-12-07 DIAGNOSIS — I6529 Occlusion and stenosis of unspecified carotid artery: Secondary | ICD-10-CM | POA: Diagnosis not present

## 2017-12-07 DIAGNOSIS — F0150 Vascular dementia without behavioral disturbance: Secondary | ICD-10-CM | POA: Diagnosis not present

## 2017-12-08 DIAGNOSIS — I69318 Other symptoms and signs involving cognitive functions following cerebral infarction: Secondary | ICD-10-CM | POA: Diagnosis not present

## 2017-12-08 DIAGNOSIS — R634 Abnormal weight loss: Secondary | ICD-10-CM | POA: Diagnosis not present

## 2017-12-08 DIAGNOSIS — I10 Essential (primary) hypertension: Secondary | ICD-10-CM | POA: Diagnosis not present

## 2017-12-08 DIAGNOSIS — F0150 Vascular dementia without behavioral disturbance: Secondary | ICD-10-CM | POA: Diagnosis not present

## 2017-12-08 DIAGNOSIS — I739 Peripheral vascular disease, unspecified: Secondary | ICD-10-CM | POA: Diagnosis not present

## 2017-12-08 DIAGNOSIS — I6529 Occlusion and stenosis of unspecified carotid artery: Secondary | ICD-10-CM | POA: Diagnosis not present

## 2017-12-09 DIAGNOSIS — I10 Essential (primary) hypertension: Secondary | ICD-10-CM | POA: Diagnosis not present

## 2017-12-09 DIAGNOSIS — I739 Peripheral vascular disease, unspecified: Secondary | ICD-10-CM | POA: Diagnosis not present

## 2017-12-09 DIAGNOSIS — I69318 Other symptoms and signs involving cognitive functions following cerebral infarction: Secondary | ICD-10-CM | POA: Diagnosis not present

## 2017-12-09 DIAGNOSIS — R634 Abnormal weight loss: Secondary | ICD-10-CM | POA: Diagnosis not present

## 2017-12-09 DIAGNOSIS — I6529 Occlusion and stenosis of unspecified carotid artery: Secondary | ICD-10-CM | POA: Diagnosis not present

## 2017-12-09 DIAGNOSIS — F0150 Vascular dementia without behavioral disturbance: Secondary | ICD-10-CM | POA: Diagnosis not present

## 2017-12-27 DEATH — deceased

## 2018-03-25 IMAGING — CT CT HEAD W/O CM
4 of 7 series · 13 of 47 positions shown, 14 images · non-contrast
Comparison: Head CT 07/20/2016

CLINICAL DATA: Altered level of consciousness. Multiple falls over
the past few days. Initial encounter.

EXAM:
CT HEAD WITHOUT CONTRAST
CT CERVICAL SPINE WITHOUT CONTRAST
TECHNIQUE: Multidetector CT imaging of the head and cervical spine was
performed following the standard protocol without intravenous
contrast. Multiplanar CT image reconstructions of the cervical spine
were also generated.

[Series 4: head wo · axial · 0.42mm/px · z∈[+454,+504]mm · 2 of 30 slices shown, 3 images]
[im 10/30  brain]
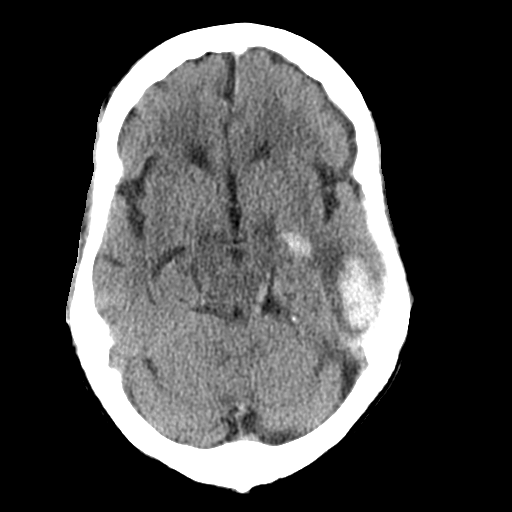
[im 10/30  bone]
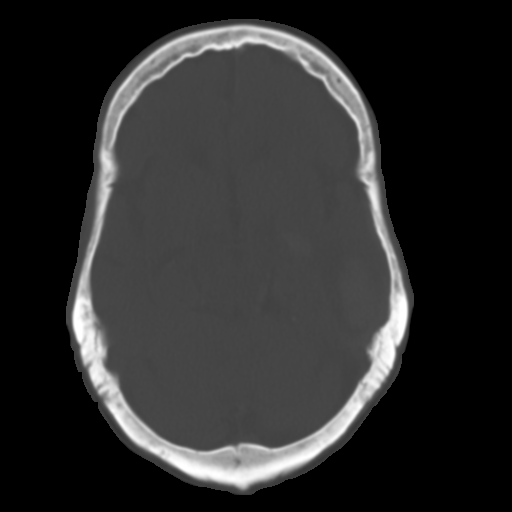
[im 20/30  brain]
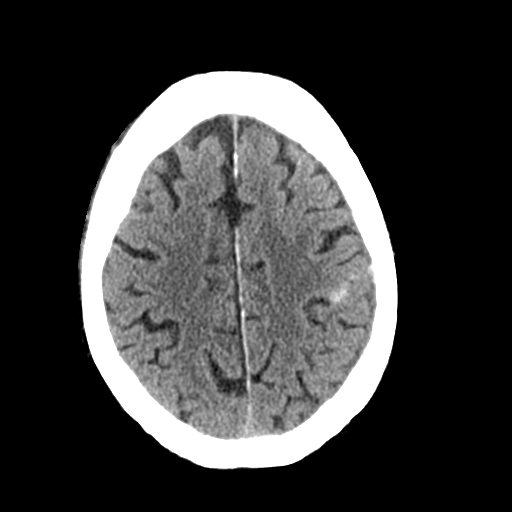

[Series 5: coronal soft tissue · coronal · 0.29mm/px · 3 of 64 slices shown]
[im 13/64  brain]
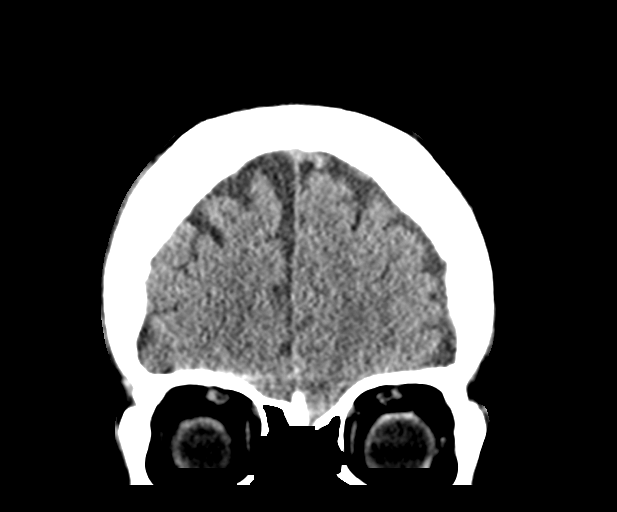
[im 26/64  brain]
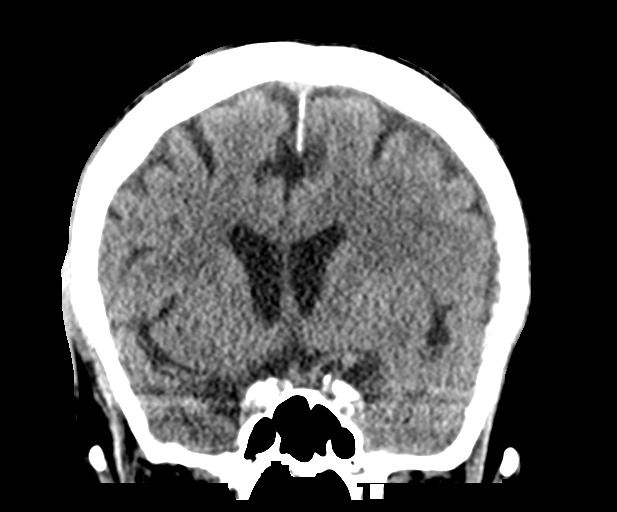
[im 38/64  brain]
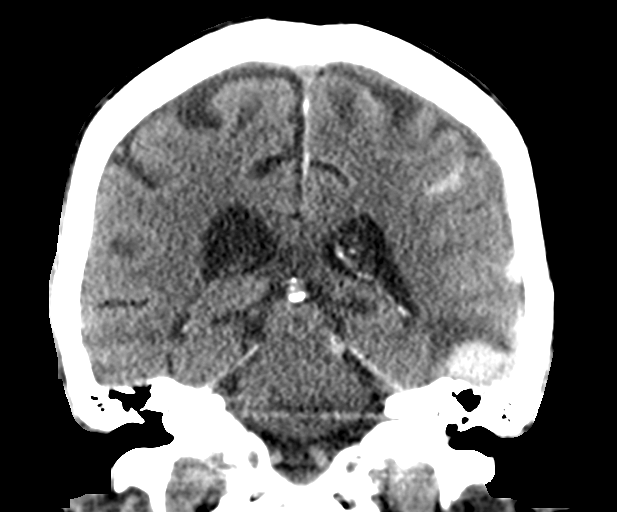

[Series 6: sagittal soft tissue · sagittal · 0.29mm/px · 2 of 57 slices shown]
[im 19/57  brain]
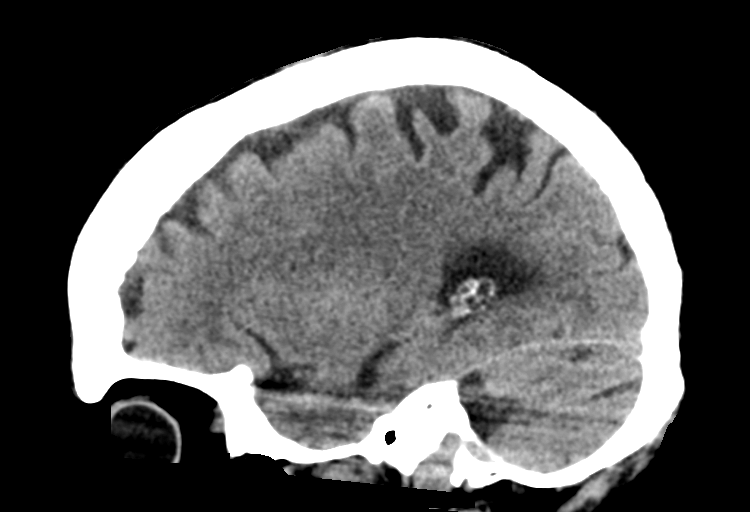
[im 38/57  brain]
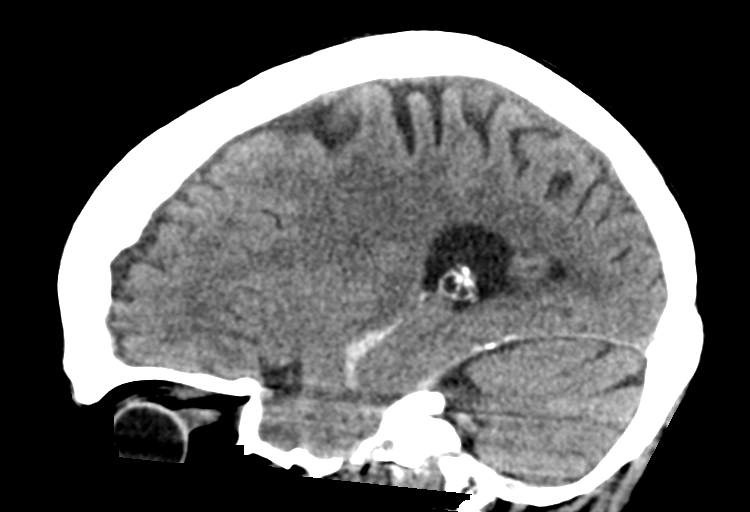

[Series 12: orthogonal bone · axial · 0.23mm/px · z∈[+238,+338]mm · 6 of 91 slices shown]
[im 7/91  bone]
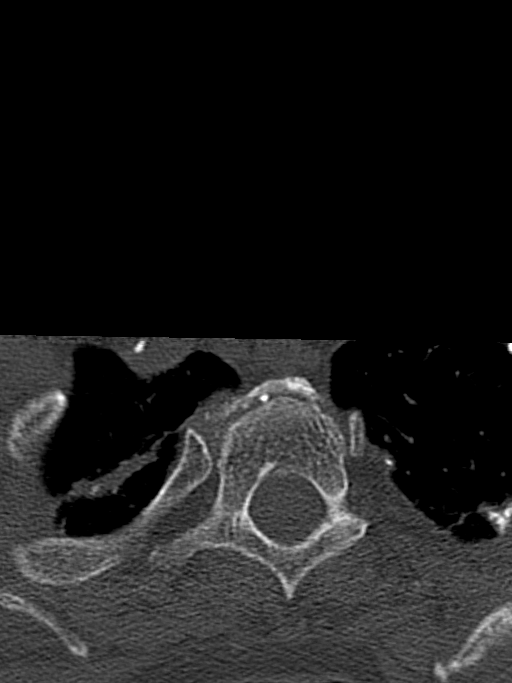
[im 21/91  bone]
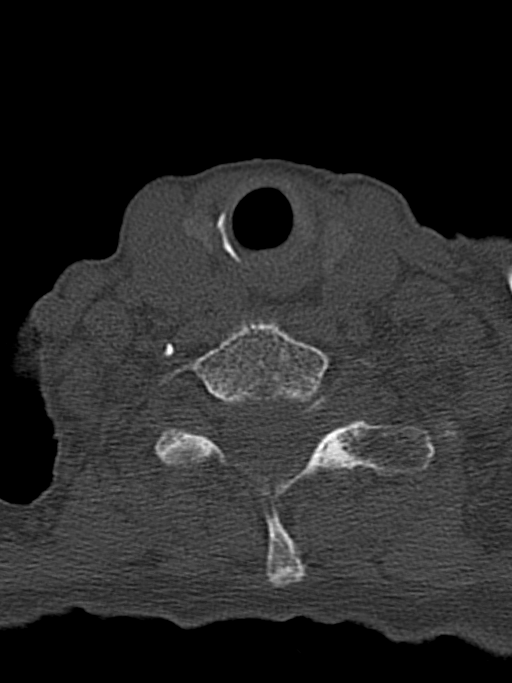
[im 28/91  bone]
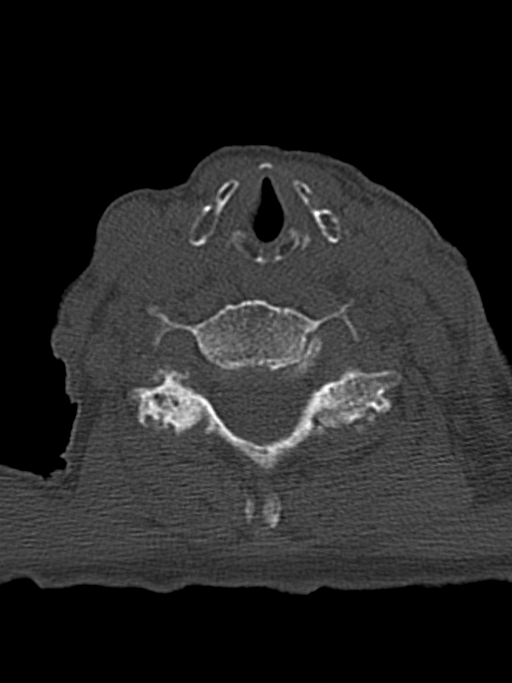
[im 42/91  bone]
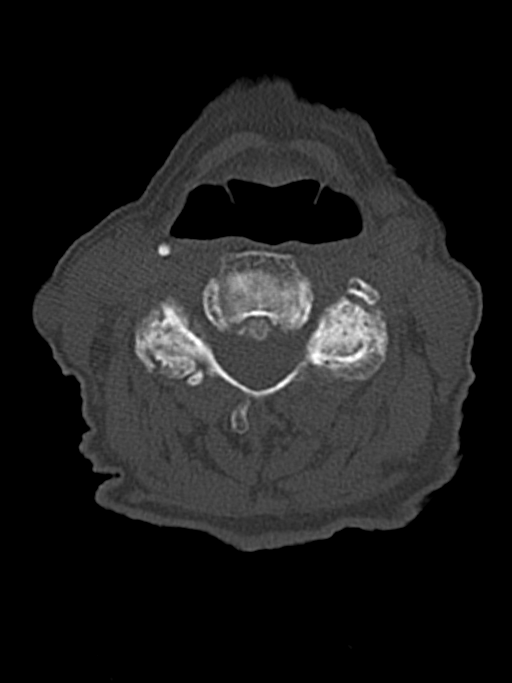
[im 49/91  bone]
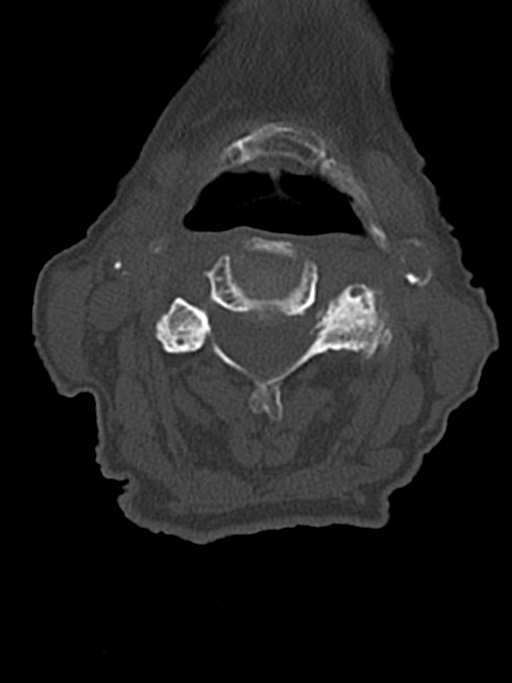
[im 63/91  bone]
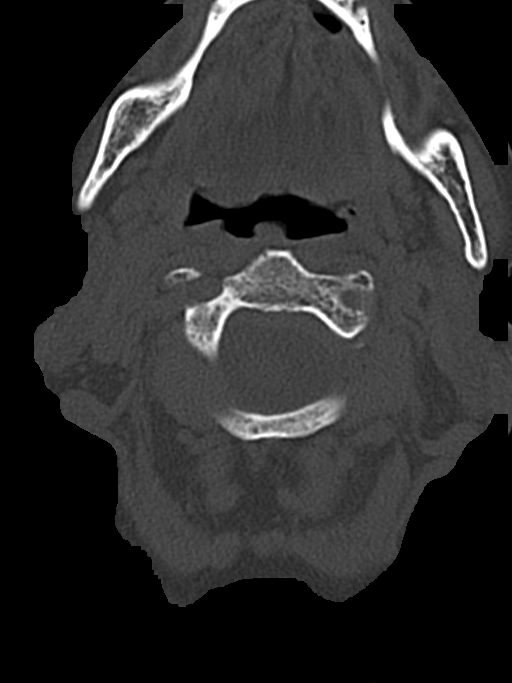

[13 of 47 positions shown; findings below may reference images not displayed]

FINDINGS: CT HEAD FINDINGS

Brain: 32 x 14 x 22 mm left temporal hematoma which extends to the
cortical surface and has a rim of edema. This 4.5 cc hematoma is
likely a hemorrhagic contusion given the history and location. There
is small volume intraventricular hemorrhage seen in the temporal
horn of the left lateral ventricle. Small volume subdural and
subarachnoid hemorrhage along the left cerebral convexity small
volume subarachnoid hemorrhage in the left perimesencephalic
cistern. Subdural hemorrhage measures at most 5 mm thickness. There
is mild rightward midline shift of 4 mm. Cerebral atrophy.

Vascular: Atherosclerotic calcification.  No hyperdense vessel.

Skull: No visible fracture. There is newly seen partial
opacification of left mastoid and middle ear spaces. Asymmetric
high-density thickening along the right temporal fossa and parietal
bone consistent with scalp hemorrhage in setting.

Sinuses/Orbits: Bilateral cataract resection. Clear paranasal
sinuses.

Other: Critical Value/emergent results were called by telephone at
the time of interpretation on 11/04/2016 at [DATE] to Dr. OLACIREGUI
TIGER , who verbally acknowledged these results.

CT CERVICAL SPINE FINDINGS

Alignment: No suspected traumatic malalignment. Mild C4-5 and C5-6
anterolisthesis that is likely facet mediated.

Skull base and vertebrae: Negative for acute fracture.

Soft tissues and spinal canal: No gross canal hematoma or
prevertebral edema.

Disc levels: Diffuse degenerative disc narrowing. Facet arthropathy
with diffuse bulky spurring. No evidence of cord impingement.
Atlantodental degeneration and retro dental ligamentous thickening
and calcification.

Upper chest: Biapical pleural based fibrosis and calcification
IMPRESSION: 1. Multifocal intracranial hemorrhage, an overall traumatic pattern
in this patient with history of multiple recent falls.
2. Hemorrhagic contusion in the left temporal lobe with 5 cc
hematoma. Small volume subarachnoid, intraventricular, and subdural
hemorrhage on left. This subdural hemorrhage measures up to 5 mm in
thickness. Midline shift measures 4 mm.
3. Right scalp contusion and partial left mastoid/middle ear
opacification without visible fracture.
4. Negative for cervical spine fracture.
